# Patient Record
Sex: Female | Born: 1985 | Race: Black or African American | Hispanic: No | Marital: Single | Smoking: Never smoker
Health system: Southern US, Community
[De-identification: ages and names within clinical notes are randomized; demographics above are authoritative.]

## PROBLEM LIST (undated history)

## (undated) ENCOUNTER — Inpatient Hospital Stay (HOSPITAL_COMMUNITY): Payer: Self-pay

## (undated) DIAGNOSIS — D649 Anemia, unspecified: Secondary | ICD-10-CM

## (undated) DIAGNOSIS — F32A Depression, unspecified: Secondary | ICD-10-CM

## (undated) DIAGNOSIS — F419 Anxiety disorder, unspecified: Secondary | ICD-10-CM

## (undated) DIAGNOSIS — N281 Cyst of kidney, acquired: Secondary | ICD-10-CM

## (undated) DIAGNOSIS — J45909 Unspecified asthma, uncomplicated: Secondary | ICD-10-CM

## (undated) DIAGNOSIS — L509 Urticaria, unspecified: Secondary | ICD-10-CM

## (undated) DIAGNOSIS — J069 Acute upper respiratory infection, unspecified: Secondary | ICD-10-CM

## (undated) DIAGNOSIS — T783XXA Angioneurotic edema, initial encounter: Secondary | ICD-10-CM

## (undated) DIAGNOSIS — F329 Major depressive disorder, single episode, unspecified: Secondary | ICD-10-CM

## (undated) HISTORY — DX: Acute upper respiratory infection, unspecified: J06.9

## (undated) HISTORY — DX: Angioneurotic edema, initial encounter: T78.3XXA

## (undated) HISTORY — PX: NO PAST SURGERIES: SHX2092

## (undated) HISTORY — DX: Urticaria, unspecified: L50.9

---
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
## 2012-05-23 ENCOUNTER — Encounter (HOSPITAL_COMMUNITY): Payer: Self-pay | Admitting: *Deleted

## 2012-05-23 ENCOUNTER — Emergency Department (HOSPITAL_COMMUNITY)
Admission: EM | Admit: 2012-05-23 | Discharge: 2012-05-23 | Disposition: A | Discharge status: Home / Self Care | Payer: Medicaid Other | Attending: Emergency Medicine | Admitting: Emergency Medicine

## 2012-05-23 DIAGNOSIS — R42 Dizziness and giddiness: Secondary | ICD-10-CM

## 2012-05-23 DIAGNOSIS — Z3202 Encounter for pregnancy test, result negative: Secondary | ICD-10-CM | POA: Insufficient documentation

## 2012-05-23 DIAGNOSIS — R110 Nausea: Secondary | ICD-10-CM

## 2012-05-23 DIAGNOSIS — Z87448 Personal history of other diseases of urinary system: Secondary | ICD-10-CM | POA: Insufficient documentation

## 2012-05-23 DIAGNOSIS — R112 Nausea with vomiting, unspecified: Secondary | ICD-10-CM | POA: Insufficient documentation

## 2012-05-23 HISTORY — DX: Cyst of kidney, acquired: N28.1

## 2012-05-23 LAB — CBC WITH DIFFERENTIAL/PLATELET
Eosinophils Relative: 3 % (ref 0–5)
HCT: 39.5 % (ref 36.0–46.0)
Hemoglobin: 13.3 g/dL (ref 12.0–15.0)
Lymphocytes Relative: 27 % (ref 12–46)
Lymphs Abs: 2.5 10*3/uL (ref 0.7–4.0)
MCV: 98.8 fL (ref 78.0–100.0)
Monocytes Absolute: 0.8 10*3/uL (ref 0.1–1.0)
Monocytes Relative: 9 % (ref 3–12)
Neutro Abs: 5.6 10*3/uL (ref 1.7–7.7)
WBC: 9.1 10*3/uL (ref 4.0–10.5)

## 2012-05-23 LAB — BASIC METABOLIC PANEL
BUN: 13 mg/dL (ref 6–23)
CO2: 27 mEq/L (ref 19–32)
Calcium: 9.3 mg/dL (ref 8.4–10.5)
Chloride: 100 mEq/L (ref 96–112)
Creatinine, Ser: 0.66 mg/dL (ref 0.50–1.10)
Glucose, Bld: 80 mg/dL (ref 70–99)

## 2012-05-23 LAB — PREGNANCY, URINE: Preg Test, Ur: NEGATIVE

## 2012-05-23 NOTE — ED Provider Notes (Signed)
History     CSN: [id number]  Arrival date & time 05/23/12  1255   First MD Initiated Contact with Patient 05/23/12 1547      Chief Complaint  Patient presents with  . Dizziness  . Emesis    (Consider location/radiation/quality/duration/timing/severity/associated sxs/prior treatment) Patient is a 27 y.o. female presenting with vomiting. The history is provided by the patient.  Emesis Associated symptoms: no abdominal pain, no chills and no headaches   pt c/o feeling lightheaded/dizzy at times in past couple months. States will occur randomly, at rest. Not necessarily related to standing. No acute or abrupt change today. No episodes of fainting. No associated palpitations or sense of irregular heart beat. No chest pain or discomfort. No unusual doe or fatigue. No diaphoresis. Denies any room spinning sensation. No ear pain, hearing loss or  Tinnitus. No headaches. No numbness/weakness. No change in speech or vision. Is eating and drinking normally. No abrupt wt loss or wt gain. Denies heat or cold intolerance, skin changes, sweats/chills, swelling, or hx thyroid disease. No recent cold or uri symptoms. Had single episode emesis today. No abd pain. No diarrhea. No dysuria or gu c/o. iud for birth control. Missed period this month. Denies vaginal bleeding or discharge. Denies hx anemia, heavy periods, or any hx melena/rectal bleeding. No recent med use or change.     Past Medical History  Diagnosis Date  . Cyst of right kidney     History reviewed. No pertinent past surgical history.  No family history on file.  History  Substance Use Topics  . Smoking status: Never Smoker   . Smokeless tobacco: Not on file  . Alcohol Use: Yes     Comment: social    OB History   Grav Para Term Preterm Abortions TAB SAB Ect Mult Living                  Review of Systems  Constitutional: Negative for fever and chills.  HENT: Negative for neck pain.   Eyes: Negative for visual disturbance.   Respiratory: Negative for cough and shortness of breath.   Cardiovascular: Negative for chest pain, palpitations and leg swelling.  Gastrointestinal: Positive for vomiting. Negative for abdominal pain and blood in stool.  Genitourinary: Negative for flank pain and vaginal bleeding.  Musculoskeletal: Negative for back pain.  Skin: Negative for rash.  Neurological: Negative for syncope and headaches.  Hematological: Does not bruise/bleed easily.  Psychiatric/Behavioral: Negative for confusion.    Allergies  Review of patient's allergies indicates no known allergies.  Home Medications  No current outpatient prescriptions on file.  BP 131/69  Pulse 87  Temp(Src) 97.9 F (36.6 C) (Oral)  Resp 18  Ht 5\' 5"  (1.651 m)  Wt 135 lb (61.236 kg)  BMI 22.47 kg/m2  SpO2 100%  LMP 03/28/2012  Physical Exam  Nursing note and vitals reviewed. Constitutional: She is oriented to person, place, and time. She appears well-developed and well-nourished. No distress.  HENT:  Nose: Nose normal.  Mouth/Throat: Oropharynx is clear and moist.  Eyes: Conjunctivae are normal. Pupils are equal, round, and reactive to light. No scleral icterus.  Neck: Neck supple. No tracheal deviation present. No thyromegaly present.  Cardiovascular: Normal rate, regular rhythm, normal heart sounds and intact distal pulses.  Exam reveals no gallop and no friction rub.   No murmur heard. Pulmonary/Chest: Effort normal and breath sounds normal. No respiratory distress.  Abdominal: Soft. Normal appearance. She exhibits no distension. There is no tenderness.  Musculoskeletal:  She exhibits no edema.  Neurological: She is alert and oriented to person, place, and time.  Steady gait  Skin: Skin is warm and dry. No rash noted. She is not diaphoretic.  Psychiatric: She has a normal mood and affect.    ED Course  Procedures (including critical care time)  Results for orders placed during the hospital encounter of 05/23/12   PREGNANCY, URINE      Result Value Range   Preg Test, Ur NEGATIVE  NEGATIVE  CBC WITH DIFFERENTIAL      Result Value Range   WBC 9.1  4.0 - 10.5 K/uL   RBC 4.00  3.87 - 5.11 MIL/uL   Hemoglobin 13.3  12.0 - 15.0 g/dL   HCT 40.9  81.1 - 91.4 %   MCV 98.8  78.0 - 100.0 fL   MCH 33.3  26.0 - 34.0 pg   MCHC 33.7  30.0 - 36.0 g/dL   RDW 78.2  95.6 - 21.3 %   Platelets 217  150 - 400 K/uL   Neutrophils Relative 61  43 - 77 %   Neutro Abs 5.6  1.7 - 7.7 K/uL   Lymphocytes Relative 27  12 - 46 %   Lymphs Abs 2.5  0.7 - 4.0 K/uL   Monocytes Relative 9  3 - 12 %   Monocytes Absolute 0.8  0.1 - 1.0 K/uL   Eosinophils Relative 3  0 - 5 %   Eosinophils Absolute 0.2  0.0 - 0.7 K/uL   Basophils Relative 0  0 - 1 %   Basophils Absolute 0.0  0.0 - 0.1 K/uL  BASIC METABOLIC PANEL      Result Value Range   Sodium 136  135 - 145 mEq/L   Potassium 3.5  3.5 - 5.1 mEq/L   Chloride 100  96 - 112 mEq/L   CO2 27  19 - 32 mEq/L   Glucose, Bld 80  70 - 99 mg/dL   BUN 13  6 - 23 mg/dL   Creatinine, Ser 0.86  0.50 - 1.10 mg/dL   Calcium 9.3  8.4 - 57.8 mg/dL   GFR calc non Af Amer >90  >90 mL/min   GFR calc Af Amer >90  >90 mL/min       MDM  Labs.  Reviewed nursing notes and prior charts for additional history.   Pt ambulatory about ed with steady gait, no faintness/dizziness.  No acute or abrupt change in symptoms today.  Labs normal.   Pt appears stable for d/c.        Suzi Roots, MD 05/23/12 1700

## 2012-05-23 NOTE — ED Notes (Addendum)
Pt c/o dizzy spells for the past month, vomiting this am. Unsure of pregnancy, states that she did miss her last period but has IUD in place

## 2012-12-22 ENCOUNTER — Encounter (HOSPITAL_COMMUNITY): Payer: Self-pay | Admitting: Emergency Medicine

## 2012-12-22 ENCOUNTER — Emergency Department (HOSPITAL_COMMUNITY): Payer: Medicaid Other

## 2012-12-22 ENCOUNTER — Emergency Department (HOSPITAL_COMMUNITY)
Admission: EM | Admit: 2012-12-22 | Discharge: 2012-12-22 | Disposition: A | Discharge status: Home / Self Care | Payer: Medicaid Other | Attending: Emergency Medicine | Admitting: Emergency Medicine

## 2012-12-22 DIAGNOSIS — Z87448 Personal history of other diseases of urinary system: Secondary | ICD-10-CM | POA: Insufficient documentation

## 2012-12-22 DIAGNOSIS — R0789 Other chest pain: Secondary | ICD-10-CM

## 2012-12-22 DIAGNOSIS — Z3202 Encounter for pregnancy test, result negative: Secondary | ICD-10-CM | POA: Insufficient documentation

## 2012-12-22 LAB — POCT PREGNANCY, URINE: Preg Test, Ur: NEGATIVE

## 2012-12-22 LAB — BASIC METABOLIC PANEL
BUN: 9 mg/dL (ref 6–23)
Calcium: 9.7 mg/dL (ref 8.4–10.5)
Chloride: 105 mEq/L (ref 96–112)
Creatinine, Ser: 0.6 mg/dL (ref 0.50–1.10)
GFR calc Af Amer: >90 mL/min (ref >90)

## 2012-12-22 LAB — CBC
HCT: 36.9 % (ref 36.0–46.0)
MCH: 32.3 pg (ref 26.0–34.0)
MCHC: 34.1 g/dL (ref 30.0–36.0)
MCV: 94.6 fL (ref 78.0–100.0)
RDW: 12.4 % (ref 11.5–15.5)
WBC: 8.3 10*3/uL (ref 4.0–10.5)

## 2012-12-22 NOTE — ED Notes (Signed)
Pt reports feeling a little lightheaded, and SOB. Denies CP at this time.

## 2012-12-22 NOTE — ED Provider Notes (Signed)
CSN: [id number]     Arrival date & time 12/22/12  1535 History   First MD Initiated Contact with Patient 12/22/12 1844     Chief Complaint  Patient presents with  . Chest Pain   (Consider location/radiation/quality/duration/timing/severity/associated sxs/prior Treatment) HPI Comments: 27 yo female with no medical hx, non smoker, no birth control presents after episode of chest tightness last night that has since resolved.  No syncope, HA or lung hx.  Patient denies blood clot history, active cancer, recent major trauma or surgery, unilateral leg swelling/ pain, recent long travel, hemoptysis or oral contraceptives.  No infection recently.   Patient is a 27 y.o. female presenting with chest pain. The history is provided by the patient.  Chest Pain Associated symptoms: no abdominal pain, no back pain, no fever, no headache, no shortness of breath and not vomiting     Past Medical History  Diagnosis Date  . Cyst of right kidney    History reviewed. No pertinent past surgical history. History reviewed. No pertinent family history. History  Substance Use Topics  . Smoking status: Never Smoker   . Smokeless tobacco: Not on file  . Alcohol Use: Yes     Comment: social   OB History   Grav Para Term Preterm Abortions TAB SAB Ect Mult Living                 Review of Systems  Constitutional: Negative for fever and chills.  Eyes: Negative for visual disturbance.  Respiratory: Positive for chest tightness. Negative for shortness of breath.   Cardiovascular: Negative for chest pain and leg swelling.  Gastrointestinal: Negative for vomiting and abdominal pain.  Genitourinary: Negative for dysuria and flank pain.  Musculoskeletal: Negative for back pain, neck pain and neck stiffness.  Skin: Negative for rash.  Neurological: Negative for light-headedness and headaches.    Allergies  Review of patient's allergies indicates no known allergies.  Home Medications  No current outpatient  prescriptions on file. BP 105/66  Pulse 77  Temp(Src) 98.3 F (36.8 C) (Oral)  Resp 17  SpO2 99%  LMP 11/25/2012 Physical Exam  Nursing note and vitals reviewed. Constitutional: She is oriented to person, place, and time. She appears well-developed and well-nourished.  HENT:  Head: Normocephalic and atraumatic.  Mouth/Throat: Oropharynx is clear and moist.  Eyes: Conjunctivae are normal. Right eye exhibits no discharge. Left eye exhibits no discharge.  Neck: Normal range of motion. Neck supple. No tracheal deviation present.  Cardiovascular: Normal rate, regular rhythm and intact distal pulses.   No murmur heard. Pulmonary/Chest: Effort normal and breath sounds normal.  Abdominal: Soft. She exhibits no distension. There is no tenderness. There is no guarding.  Musculoskeletal: She exhibits no edema and no tenderness.  Neurological: She is alert and oriented to person, place, and time.  Skin: Skin is warm. No rash noted.  Psychiatric: She has a normal mood and affect.    ED Course  Procedures (including critical care time) Labs Review Labs Reviewed  BASIC METABOLIC PANEL - Abnormal; Notable for the following:    Glucose, Bld 140 (*)    All other components within normal limits  CBC  PRO B NATRIURETIC PEPTIDE  POCT I-STAT TROPONIN I  POCT PREGNANCY, URINE   Imaging Review Dg Chest 2 View  12/22/2012   CLINICAL DATA:  Short of breath, chest tightness  EXAM: CHEST  2 VIEW  COMPARISON:  None.  FINDINGS: No active infiltrate or effusion is seen. Mediastinal contours appear normal. The  heart is within normal limits in size. No bony abnormality is noted.  IMPRESSION: No active lung disease.   Electronically Signed   By: Dwyane Dee M.D.   On: 12/22/2012 16:12    EKG Interpretation   None       MDM   1. Chest tightness    Pt very low risk PE and very low risk CAD.  No cp on exam. EKG no acute findings.  PERC neg, no indication for d dimer.   Results and differential  diagnosis were discussed with the patient. Close follow up outpatient was discussed, patient comfortable with the plan.   Diagnosis: Chest tightness    Enid Skeens, MD 12/22/12 [phone]

## 2012-12-22 NOTE — ED Notes (Signed)
Pt reports a mid chest tightness and sob that started yesterday. ekg done at triage, airway intact, no distress noted, spo2 96%.

## 2013-06-19 ENCOUNTER — Encounter (HOSPITAL_COMMUNITY): Payer: Self-pay | Admitting: Emergency Medicine

## 2013-06-19 ENCOUNTER — Emergency Department (HOSPITAL_COMMUNITY)
Admission: EM | Admit: 2013-06-19 | Discharge: 2013-06-19 | Disposition: A | Discharge status: Home / Self Care | Payer: Medicaid Other | Attending: Emergency Medicine | Admitting: Emergency Medicine

## 2013-06-19 ENCOUNTER — Emergency Department (HOSPITAL_COMMUNITY): Payer: Medicaid Other

## 2013-06-19 DIAGNOSIS — J40 Bronchitis, not specified as acute or chronic: Secondary | ICD-10-CM

## 2013-06-19 DIAGNOSIS — Z79899 Other long term (current) drug therapy: Secondary | ICD-10-CM | POA: Insufficient documentation

## 2013-06-19 DIAGNOSIS — J209 Acute bronchitis, unspecified: Secondary | ICD-10-CM | POA: Insufficient documentation

## 2013-06-19 DIAGNOSIS — R062 Wheezing: Secondary | ICD-10-CM | POA: Insufficient documentation

## 2013-06-19 MED ORDER — PREDNISONE 20 MG PO TABS: 40.0000 mg | ORAL_TABLET | Freq: Once | ORAL | Status: DC

## 2013-06-19 MED ORDER — ALBUTEROL SULFATE (2.5 MG/3ML) 0.083% IN NEBU
5.0000 mg | INHALATION_SOLUTION | Freq: Once | RESPIRATORY_TRACT | Status: AC
  Administered 2013-06-19: 5 mg via RESPIRATORY_TRACT
  Filled 2013-06-19 (×2): qty 6

## 2013-06-19 MED ORDER — PREDNISONE 20 MG PO TABS
40.0000 mg | ORAL_TABLET | Freq: Once | ORAL | Status: AC
  Administered 2013-06-19: 40 mg via ORAL
  Filled 2013-06-19: qty 2

## 2013-06-19 MED ORDER — ALBUTEROL SULFATE HFA 108 (90 BASE) MCG/ACT IN AERS
2.0000 | INHALATION_SPRAY | RESPIRATORY_TRACT | Status: DC | PRN
  Administered 2013-06-19: 2 via RESPIRATORY_TRACT
  Filled 2013-06-19: qty 6.7

## 2013-06-19 NOTE — Discharge Instructions (Signed)

## 2013-06-19 NOTE — ED Provider Notes (Signed)
CSN: [id number]     Arrival date & time 06/19/13  1931 History   First MD Initiated Contact with Patient 06/19/13 2016     Chief Complaint  Patient presents with  . Shortness of Breath     (Consider location/radiation/quality/duration/timing/severity/associated sxs/prior Treatment) Patient is a 28 y.o. female presenting with shortness of breath.  Shortness of Breath  Pt with no significant PMH reports 2 days of cough, wheezing and SOB since vacuuming. This has happened before with dust but typically improves on its own. She called EMS yesterday for same and was given neb with some improvement but did not seek additional treatment. She denies fever. No CP, nausea vomiting. PERC neg although she does reports some vague R calf soreness there has been no swelling.   Past Medical History  Diagnosis Date  . Cyst of right kidney    History reviewed. No pertinent past surgical history. No family history on file. History  Substance Use Topics  . Smoking status: Never Smoker   . Smokeless tobacco: Not on file  . Alcohol Use: Yes     Comment: social   OB History   Grav Para Term Preterm Abortions TAB SAB Ect Mult Living                 Review of Systems  Respiratory: Positive for shortness of breath.     All other systems reviewed and are negative except as noted in HPI.    Allergies  Review of patient's allergies indicates no known allergies.  Home Medications   Prior to Admission medications   Not on File   BP 127/90  Pulse 98  Temp(Src) 98.2 F (36.8 C) (Oral)  Resp 18  Ht 5\' 4"  (1.626 m)  Wt 175 lb (79.379 kg)  BMI 30.02 kg/m2  SpO2 99%  LMP 05/19/2013 Physical Exam  Nursing note and vitals reviewed. Constitutional: She is oriented to person, place, and time. She appears well-developed and well-nourished.  HENT:  Head: Normocephalic and atraumatic.  Eyes: EOM are normal. Pupils are equal, round, and reactive to light.  Neck: Normal range of motion. Neck  supple.  Cardiovascular: Normal rate, normal heart sounds and intact distal pulses.   Pulmonary/Chest: Effort normal. No respiratory distress. She has wheezes. She has no rales.  Abdominal: Bowel sounds are normal. She exhibits no distension. There is no tenderness.  Musculoskeletal: Normal range of motion. She exhibits no edema and no tenderness.  Neurological: She is alert and oriented to person, place, and time. She has normal strength. No cranial nerve deficit or sensory deficit.  Skin: Skin is warm and dry. No rash noted.  Psychiatric: She has a normal mood and affect.    ED Course  Procedures (including critical care time) Labs Review Labs Reviewed - No data to display  Imaging Review Dg Chest 2 View  06/19/2013   CLINICAL DATA:  Shortness of breath for 3 days, history acute bronchitis  EXAM: CHEST  2 VIEW  COMPARISON:  12/22/2012  FINDINGS: Normal heart size, mediastinal contours, and pulmonary vascularity.  Peribronchial thickening centrally, chronic.  No acute infiltrate, pleural effusion or pneumothorax.  Bones unremarkable.  IMPRESSION: Mild chronic bronchitic changes without acute infiltrate.   Electronically Signed   By: Ulyses SouthwardMark  Boles M.D.   On: 06/19/2013 21:08     EKG Interpretation   Date/Time:  Saturday June 19 2013 19:35:00 EDT Ventricular Rate:  98 PR Interval:  144 QRS Duration: 82 QT Interval:  346 QTC Calculation: 441 R  Axis:   77 Text Interpretation:  Normal sinus rhythm Normal ECG No significant change  since last tracing Confirmed by [facility]  MD, Leonette MostHARLES [phone] on 06/19/2013  8:14:47 PM      MDM   Final diagnoses:  Bronchitis   Bronchitis, viral vs allergic. CXR ordered in triage is pending. EKG is normal. Plan neb, prednisone, MDI and PCP followup.     Charles B. Bernette MayersSheldon, MD 06/19/13 2159

## 2013-06-19 NOTE — ED Notes (Signed)
Pt reports chest tightness

## 2013-06-19 NOTE — ED Notes (Signed)
Pt c/o sob since yesterday no chest pain .  C/o rt calf pain for 3-4 days.  She denies  A cold or cough.  Although she has a productive sounding cough

## 2016-01-10 ENCOUNTER — Encounter: Payer: Self-pay | Admitting: Obstetrics & Gynecology

## 2016-01-10 ENCOUNTER — Ambulatory Visit (INDEPENDENT_AMBULATORY_CARE_PROVIDER_SITE_OTHER): Payer: Federal, State, Local not specified - PPO

## 2016-01-10 DIAGNOSIS — Z3201 Encounter for pregnancy test, result positive: Secondary | ICD-10-CM

## 2016-01-10 DIAGNOSIS — Z3200 Encounter for pregnancy test, result unknown: Secondary | ICD-10-CM

## 2016-01-10 LAB — POCT PREGNANCY, URINE: Preg Test, Ur: POSITIVE — AB

## 2016-01-10 NOTE — Progress Notes (Signed)
Patient presented to office today for pregnancy test. Test confirms she is pregnant at this time. Patient is currently taking prenatal vitamins. She has been given a letter of verification to apply for pregnancy medicaid.

## 2016-01-14 ENCOUNTER — Encounter (HOSPITAL_COMMUNITY): Payer: Self-pay | Admitting: *Deleted

## 2016-01-14 ENCOUNTER — Inpatient Hospital Stay (HOSPITAL_COMMUNITY): Payer: Federal, State, Local not specified - PPO

## 2016-01-14 ENCOUNTER — Inpatient Hospital Stay (HOSPITAL_COMMUNITY)
Admission: AD | Admit: 2016-01-14 | Discharge: 2016-01-14 | Disposition: A | Discharge status: Home / Self Care | Payer: Federal, State, Local not specified - PPO | Source: Ambulatory Visit | Attending: Obstetrics & Gynecology | Admitting: Obstetrics & Gynecology

## 2016-01-14 DIAGNOSIS — B373 Candidiasis of vulva and vagina: Secondary | ICD-10-CM | POA: Diagnosis not present

## 2016-01-14 DIAGNOSIS — O210 Mild hyperemesis gravidarum: Secondary | ICD-10-CM | POA: Insufficient documentation

## 2016-01-14 DIAGNOSIS — O26891 Other specified pregnancy related conditions, first trimester: Secondary | ICD-10-CM

## 2016-01-14 DIAGNOSIS — B3731 Acute candidiasis of vulva and vagina: Secondary | ICD-10-CM

## 2016-01-14 DIAGNOSIS — R1031 Right lower quadrant pain: Secondary | ICD-10-CM | POA: Diagnosis not present

## 2016-01-14 DIAGNOSIS — B9689 Other specified bacterial agents as the cause of diseases classified elsewhere: Secondary | ICD-10-CM

## 2016-01-14 DIAGNOSIS — O26899 Other specified pregnancy related conditions, unspecified trimester: Secondary | ICD-10-CM

## 2016-01-14 DIAGNOSIS — O23591 Infection of other part of genital tract in pregnancy, first trimester: Secondary | ICD-10-CM | POA: Diagnosis not present

## 2016-01-14 DIAGNOSIS — Z3A01 Less than 8 weeks gestation of pregnancy: Secondary | ICD-10-CM | POA: Insufficient documentation

## 2016-01-14 DIAGNOSIS — R109 Unspecified abdominal pain: Secondary | ICD-10-CM

## 2016-01-14 DIAGNOSIS — O98811 Other maternal infectious and parasitic diseases complicating pregnancy, first trimester: Secondary | ICD-10-CM | POA: Insufficient documentation

## 2016-01-14 DIAGNOSIS — N760 Acute vaginitis: Secondary | ICD-10-CM

## 2016-01-14 DIAGNOSIS — R102 Pelvic and perineal pain: Secondary | ICD-10-CM | POA: Diagnosis not present

## 2016-01-14 LAB — CBC
HCT: 33.2 % — ABNORMAL LOW (ref 36.0–46.0)
HEMOGLOBIN: 11.6 g/dL — AB (ref 12.0–15.0)
MCH: 32.6 pg (ref 26.0–34.0)
MCHC: 34.9 g/dL (ref 30.0–36.0)
MCV: 93.3 fL (ref 78.0–100.0)
Platelets: 238 10*3/uL (ref 150–400)
RBC: 3.56 MIL/uL — ABNORMAL LOW (ref 3.87–5.11)
RDW: 13 % (ref 11.5–15.5)
WBC: 7.9 10*3/uL (ref 4.0–10.5)

## 2016-01-14 LAB — URINALYSIS, ROUTINE W REFLEX MICROSCOPIC
BILIRUBIN URINE: NEGATIVE
GLUCOSE, UA: NEGATIVE mg/dL
Hgb urine dipstick: NEGATIVE
KETONES UR: NEGATIVE mg/dL
LEUKOCYTES UA: NEGATIVE
NITRITE: NEGATIVE
PH: 7 (ref 5.0–8.0)
Protein, ur: NEGATIVE mg/dL
SPECIFIC GRAVITY, URINE: 1.015 (ref 1.005–1.030)

## 2016-01-14 LAB — WET PREP, GENITAL
Sperm: NONE SEEN
Trich, Wet Prep: NONE SEEN

## 2016-01-14 LAB — HCG, QUANTITATIVE, PREGNANCY: HCG, BETA CHAIN, QUANT, S: 5930 m[IU]/mL — AB (ref <5)

## 2016-01-14 LAB — ABO/RH: ABO/RH(D): B POS

## 2016-01-14 MED ORDER — TERCONAZOLE 0.4 % VA CREA: 1.0000 | TOPICAL_CREAM | Freq: Every day | VAGINAL | 0 refills | Status: DC

## 2016-01-14 MED ORDER — PROMETHAZINE HCL 25 MG PO TABS: 25.0000 mg | ORAL_TABLET | Freq: Four times a day (QID) | ORAL | 0 refills | Status: DC | PRN

## 2016-01-14 MED ORDER — METRONIDAZOLE 500 MG PO TABS: 500.0000 mg | ORAL_TABLET | Freq: Two times a day (BID) | ORAL | 0 refills | Status: DC

## 2016-01-14 NOTE — MAU Note (Signed)
Pt presents to MAU with complaints of pain in the upper right portion of her back, states that she was having pain in the right lower portion of her abdomen but it has been  radiating back and forth for two days. Denies any vaginal bleeding or abnormal discharge

## 2016-01-14 NOTE — MAU Provider Note (Signed)
History     CSN: [id number]  Arrival date and time: 01/14/16 1643   None     No chief complaint on file.  HPI Pt is 30 [id number]. [redacted]w[redacted]d who presents with upper abdominal and back pain- but initially started in right lower quadrant of her abdomen.  Pt states she has been nauseated since she found out she was pregnant.  She had a confirmation of pregnancy on 01/09/2018 and was not having pain at that time.  Pt denies spotting or bleeding. Or abnormal discharge.   RN note: Krista Shelle Iron, RN  Nursing    [] Hide copied text [] Hover for attribution information Pt presents to MAU with complaints of pain in the upper right portion of her back, states that she was having pain in the right lower portion of her abdomen but it has been  radiating back and forth for two days. Denies any vaginal bleeding or abnormal discharge    Electronically      Past Medical History:  Diagnosis Date  . Cyst of right kidney     No past surgical history on file.  No family history on file.  Social History  Substance Use Topics  . Smoking status: Never Smoker  . Smokeless tobacco: Not on file  . Alcohol use Yes     Comment: social    Allergies: No Known Allergies  Prescriptions Prior to Admission  Medication Sig Dispense Refill Last Dose  . guaiFENesin (MUCINEX) 600 MG 12 hr tablet Take 600 mg by mouth 2 (two) times daily.   06/18/2013 at Unknown time  . Multiple Vitamin (MULTIVITAMIN) tablet Take 1 tablet by mouth daily.   06/18/2013 at Unknown time  . predniSONE (DELTASONE) 20 MG tablet Take 2 tablets (40 mg total) by mouth once. 8 tablet 0     Review of Systems  Constitutional: Negative for chills and fever.  Gastrointestinal: Positive for abdominal pain, constipation and nausea. Negative for diarrhea and vomiting.  Genitourinary: Negative for dysuria.  Neurological: Positive for dizziness.   Physical Exam   Blood pressure 124/89, pulse 83, temperature 98.4 F (36.9 C), resp. rate  18, height 5\' 3"  (1.6 m), weight 185 lb (83.9 kg), last menstrual period 12/04/2015.  Physical Exam  Nursing note and vitals reviewed. Constitutional: She is oriented to person, place, and time. She appears well-developed and well-nourished. No distress.  HENT:  Head: Normocephalic.  Eyes: Pupils are equal, round, and reactive to light.  Neck: Normal range of motion. Neck supple.  Cardiovascular: Normal rate.   Respiratory: Effort normal.  GI: Soft. She exhibits no distension. There is tenderness. There is no rebound.  Genitourinary: Vagina normal.  Genitourinary Comments: Reddened vaginal mucosa, small amount of white thick discharge in vault;  cervix clean, NT; uterus NSSC NT; adnexa without palpable enlargement or tenderness  Musculoskeletal: Normal range of motion.  Neurological: She is alert and oriented to person, place, and time.  Skin: Skin is warm and dry.  Psychiatric: She has a normal mood and affect.    MAU Course  Procedures Results for orders placed or performed during the hospital encounter of 01/14/16 (from the past 24 hour(s))  Wet prep, genital     Status: Abnormal   Collection Time: 01/14/16  5:05 PM  Result Value Ref Range   Yeast Wet Prep HPF POC PRESENT (A) NONE SEEN   Trich, Wet Prep NONE SEEN NONE SEEN   Clue Cells Wet Prep HPF POC PRESENT (A) NONE SEEN   WBC, Wet Prep  HPF POC RARE (A) NONE SEEN   Sperm NONE SEEN   Urinalysis, Routine w reflex microscopic ([facility])     Status: None   Collection Time: 01/14/16  5:12 PM  Result Value Ref Range   Color, Urine YELLOW YELLOW   APPearance CLEAR CLEAR   Specific Gravity, Urine 1.015 1.005 - 1.030   pH 7.0 5.0 - 8.0   Glucose, UA NEGATIVE NEGATIVE mg/dL   Hgb urine dipstick NEGATIVE NEGATIVE   Bilirubin Urine NEGATIVE NEGATIVE   Ketones, ur NEGATIVE NEGATIVE mg/dL   Protein, ur NEGATIVE NEGATIVE mg/dL   Nitrite NEGATIVE NEGATIVE   Leukocytes, UA NEGATIVE NEGATIVE  CBC     Status: Abnormal   Collection  Time: 01/14/16  5:20 PM  Result Value Ref Range   WBC 7.9 4.0 - 10.5 K/uL   RBC 3.56 (L) 3.87 - 5.11 MIL/uL   Hemoglobin 11.6 (L) 12.0 - 15.0 g/dL   HCT 40.933.2 (L) 81.136.0 - 91.446.0 %   MCV 93.3 78.0 - 100.0 fL   MCH 32.6 26.0 - 34.0 pg   MCHC 34.9 30.0 - 36.0 g/dL   RDW 78.213.0 95.611.5 - 21.315.5 %   Platelets 238 150 - 400 K/uL  ABO/Rh     Status: None (Preliminary result)   Collection Time: 01/14/16  5:20 PM  Result Value Ref Range   ABO/RH(D) B POS   [place] Ob Comp Less 14 Wks  Result Date: 01/14/2016 CLINICAL DATA:  30 year old pregnant female with pelvic pain. Estimated gestational age of [redacted] weeks 6 days by LMP. EXAM: OBSTETRIC <14 WK US AND TRANSVAGINAL OB US TECHNIQUE: Both transabdominal and transvaginal ultrasound examinations were performed for complete evaluation of the gestation as well as the maternal uterus, adnexal regions, and pelvic cul-de-sac. Transvaginal technique was performed to assess early pregnancy. COMPARISON:  None. FINDINGS: Intrauterine gestational sac: Single Yolk sac:  Visualized. Embryo:  Not Visualized. MSD: 8.2  mm   5 w   4  d             US EDC: 09/09/2016 Subchorionic hemorrhage:  None visualized. Maternal uterus/adnexae: The ovaries are unremarkable. A trace amount of free fluid is noted. There is no evidence of adnexal mass. IMPRESSION: Single intrauterine gestational sac with yolk sac - estimated gestational age of [redacted] weeks 4 days by mean sac diameter. Fetal pole not identified at this time. No evidence of subchorionic hemorrhage. Trace amount of free fluid.  No evidence of adnexal mass. Electronically Signed   By: Harmon PierJeffrey  Hu M.D.   On: 01/14/2016 18:02   [place] Ob Transvaginal  Result Date: 01/14/2016 CLINICAL DATA:  30 year old pregnant female with pelvic pain. Estimated gestational age of [redacted] weeks 6 days by LMP. EXAM: OBSTETRIC <14 WK US AND TRANSVAGINAL OB US TECHNIQUE: Both transabdominal and transvaginal ultrasound examinations were performed for complete evaluation of the  gestation as well as the maternal uterus, adnexal regions, and pelvic cul-de-sac. Transvaginal technique was performed to assess early pregnancy. COMPARISON:  None. FINDINGS: Intrauterine gestational sac: Single Yolk sac:  Visualized. Embryo:  Not Visualized. MSD: 8.2  mm   5 w   4  d             US EDC: 09/09/2016 Subchorionic hemorrhage:  None visualized. Maternal uterus/adnexae: The ovaries are unremarkable. A trace amount of free fluid is noted. There is no evidence of adnexal mass. IMPRESSION: Single intrauterine gestational sac with yolk sac - estimated gestational age of [redacted] weeks 4 days by mean  sac diameter. Fetal pole not identified at this time. No evidence of subchorionic hemorrhage. Trace amount of free fluid.  No evidence of adnexal mass. Electronically Signed   By: Harmon PierJeffrey  Hu M.D.   On: 01/14/2016 18:02    Assessment and Plan  Abdominal pain in pregnancy IUP [id number] EDC 09/09/2016 Yeast vaginitis- RX Terazol vaginal cream Bacterial vaginosis- Flagyl 500mg  BID for 7 days Morning sickness- Rx phenergan F/u with OB care  Rawlin Reaume 01/14/2016, 5:02 PM

## 2016-01-14 NOTE — Discharge Instructions (Signed)
Abdominal Pain During Pregnancy Belly (abdominal) pain is common during pregnancy. Most of the time, it is not a serious problem. Other times, it can be a sign that something is wrong with the pregnancy. Always tell your doctor if you have belly pain. Follow these instructions at home: Monitor your belly pain for any changes. The following actions may help you feel better:  Do not have sex (intercourse) or put anything in your vagina until you feel better.  Rest until your pain stops.  Drink clear fluids if you feel sick to your stomach (nauseous). Do not eat solid food until you feel better.  Only take medicine as told by your doctor.  Keep all doctor visits as told. Get help right away if:  You are bleeding, leaking fluid, or pieces of tissue come out of your vagina.  You have more pain or cramping.  You keep throwing up (vomiting).  You have pain when you pee (urinate) or have blood in your pee.  You have a fever.  You do not feel your baby moving as much.  You feel very weak or feel like passing out.  You have trouble breathing, with or without belly pain.  You have a very bad headache and belly pain.  You have fluid leaking from your vagina and belly pain.  You keep having watery poop (diarrhea).  Your belly pain does not go away after resting, or the pain gets worse. This information is not intended to replace advice given to you by your health care provider. Make sure you discuss any questions you have with your health care provider. Document Released: 01/30/2009 Document Revised: 09/20/2015 Document Reviewed: 09/10/2012 Elsevier Interactive Patient Education  2017 [employer] Safe Medications in Pregnancy   Acne: Benzoyl Peroxide Salicylic Acid  Backache/Headache: Tylenol: 2 regular strength every 4 hours OR              2 Extra strength every 6 hours  Colds/Coughs/Allergies: Benadryl (alcohol free) 25 mg every 6 hours as needed Breath right  strips Claritin Cepacol throat lozenges Chloraseptic throat spray Cold-Eeze- up to three times per day Cough drops, alcohol free Flonase (by prescription only) Guaifenesin Mucinex Robitussin DM (plain only, alcohol free) Saline nasal spray/drops Sudafed (pseudoephedrine) & Actifed ** use only after [redacted] weeks gestation and if you do not have high blood pressure Tylenol Vicks Vaporub Zinc lozenges Zyrtec   Constipation: Colace Ducolax suppositories Fleet enema Glycerin suppositories Metamucil Milk of magnesia Miralax Senokot Smooth move tea  Diarrhea: Kaopectate Imodium A-D  *NO pepto Bismol  Hemorrhoids: Anusol Anusol HC Preparation H Tucks  Indigestion: Tums Maalox Mylanta Zantac  Pepcid  Insomnia: Benadryl (alcohol free) 25mg  every 6 hours as needed Tylenol PM Unisom, no Gelcaps  Leg Cramps: Tums MagGel  Nausea/Vomiting:  Bonine Dramamine Emetrol Ginger extract Sea bands Meclizine  Nausea medication to take during pregnancy:  Unisom (doxylamine succinate 25 mg tablets) Take one tablet daily at bedtime. If symptoms are not adequately controlled, the dose can be increased to a maximum recommended dose of two tablets daily (1/2 tablet in the morning, 1/2 tablet mid-afternoon and one at bedtime). Vitamin B6 100mg  tablets. Take one tablet twice a day (up to 200 mg per day).  Skin Rashes: Aveeno products Benadryl cream or 25mg  every 6 hours as needed Calamine Lotion 1% cortisone cream  Yeast infection: Gyne-lotrimin 7 Monistat 7   **If taking multiple medications, please check labels to avoid duplicating the same active ingredients **take medication as directed on  the label ** Do not exceed 4000 mg of tylenol in 24 hours **Do not take medications that contain aspirin or ibuprofen

## 2016-01-15 LAB — GC/CHLAMYDIA PROBE AMP (~~LOC~~) NOT AT ARMC
Chlamydia: NEGATIVE
Neisseria Gonorrhea: NEGATIVE

## 2016-01-15 LAB — HIV ANTIBODY (ROUTINE TESTING W REFLEX): HIV SCREEN 4TH GENERATION: NONREACTIVE

## 2016-02-14 ENCOUNTER — Ambulatory Visit (HOSPITAL_COMMUNITY)
Admission: RE | Admit: 2016-02-14 | Discharge: 2016-02-14 | Disposition: A | Discharge status: Home / Self Care | Payer: Federal, State, Local not specified - PPO | Source: Ambulatory Visit | Attending: Obstetrics and Gynecology | Admitting: Obstetrics and Gynecology

## 2016-02-14 ENCOUNTER — Ambulatory Visit: Payer: Self-pay

## 2016-02-14 ENCOUNTER — Encounter: Payer: Self-pay | Admitting: Obstetrics and Gynecology

## 2016-02-14 ENCOUNTER — Ambulatory Visit (INDEPENDENT_AMBULATORY_CARE_PROVIDER_SITE_OTHER): Payer: Federal, State, Local not specified - PPO | Admitting: Obstetrics and Gynecology

## 2016-02-14 VITALS — BP 124/71 | HR 83 | Ht 64.0 in | Wt 187.8 lb

## 2016-02-14 DIAGNOSIS — Z23 Encounter for immunization: Secondary | ICD-10-CM | POA: Diagnosis not present

## 2016-02-14 DIAGNOSIS — O3680X0 Pregnancy with inconclusive fetal viability, not applicable or unspecified: Secondary | ICD-10-CM | POA: Diagnosis not present

## 2016-02-14 DIAGNOSIS — Z113 Encounter for screening for infections with a predominantly sexual mode of transmission: Secondary | ICD-10-CM

## 2016-02-14 DIAGNOSIS — Z3A01 Less than 8 weeks gestation of pregnancy: Secondary | ICD-10-CM | POA: Insufficient documentation

## 2016-02-14 DIAGNOSIS — Z3689 Encounter for other specified antenatal screening: Secondary | ICD-10-CM | POA: Diagnosis not present

## 2016-02-14 DIAGNOSIS — Z3491 Encounter for supervision of normal pregnancy, unspecified, first trimester: Secondary | ICD-10-CM

## 2016-02-14 DIAGNOSIS — Z124 Encounter for screening for malignant neoplasm of cervix: Secondary | ICD-10-CM

## 2016-02-14 DIAGNOSIS — Z3490 Encounter for supervision of normal pregnancy, unspecified, unspecified trimester: Secondary | ICD-10-CM | POA: Insufficient documentation

## 2016-02-14 LAB — POCT URINALYSIS DIP (DEVICE)
BILIRUBIN URINE: NEGATIVE
Glucose, UA: NEGATIVE mg/dL
Hgb urine dipstick: NEGATIVE
KETONES UR: NEGATIVE mg/dL
LEUKOCYTES UA: NEGATIVE
NITRITE: NEGATIVE
Protein, ur: NEGATIVE mg/dL
Specific Gravity, Urine: 1.025 (ref 1.005–1.030)
Urobilinogen, UA: 0.2 mg/dL (ref 0.0–1.0)
pH: 5.5 (ref 5.0–8.0)

## 2016-02-14 MED ORDER — ONDANSETRON 4 MG PO TBDP: 4.0000 mg | ORAL_TABLET | Freq: Four times a day (QID) | ORAL | 0 refills | Status: DC | PRN

## 2016-02-14 NOTE — Progress Notes (Signed)
Breastfeeding education done Prenatal info packet given Initial prenatal labs today Flu vaccine today

## 2016-02-14 NOTE — Progress Notes (Addendum)
Pt informed that the ultrasound is considered a limited OB ultrasound and is not intended to be a complete ultrasound exam.  Patient also informed that the ultrasound is not being completed with the intent of assessing for fetal or placental anomalies or any pelvic abnormalities.  Explained that the purpose of today's ultrasound is to assess for viability.  Patient acknowledges the purpose of the exam and the limitations of the study.    Single IUP Unable to obtain m-mode for FHR due to poor visualization of embryo with abdominal probe. Pt to be sent to US dept today for Ob US and definitive diagnosis of fetal viability.

## 2016-02-14 NOTE — Addendum Note (Signed)
Addended by: Jill SideAY, Gatsby Chismar L on: 02/14/2016 10:27 AM   Modules accepted: Orders

## 2016-02-14 NOTE — Patient Instructions (Signed)
First Trimester of Pregnancy  The first trimester of pregnancy is from week 1 until the end of week 12 (months 1 through 3). A week after a sperm fertilizes an egg, the egg will implant on the wall of the uterus. This embryo will begin to develop into a baby. Genes from you and your partner are forming the baby. The female genes determine whether the baby is a boy or a girl. At 6-8 weeks, the eyes and face are formed, and the heartbeat can be seen on ultrasound. At the end of 12 weeks, all the baby's organs are formed.   Now that you are pregnant, you will want to do everything you can to have a healthy baby. Two of the most important things are to get good prenatal care and to follow your health care provider's instructions. Prenatal care is all the medical care you receive before the baby's birth. This care will help prevent, find, and treat any problems during the pregnancy and childbirth.  BODY CHANGES  Your body goes through many changes during pregnancy. The changes vary from woman to woman.   · You may gain or lose a couple of pounds at first.  · You may feel sick to your stomach (nauseous) and throw up (vomit). If the vomiting is uncontrollable, call your health care provider.  · You may tire easily.  · You may develop headaches that can be relieved by medicines approved by your health care provider.  · You may urinate more often. Painful urination may mean you have a bladder infection.  · You may develop heartburn as a result of your pregnancy.  · You may develop constipation because certain hormones are causing the muscles that push waste through your intestines to slow down.  · You may develop hemorrhoids or swollen, bulging veins (varicose veins).  · Your breasts may begin to grow larger and become tender. Your nipples may stick out more, and the tissue that surrounds them (areola) may become darker.  · Your gums may bleed and may be sensitive to brushing and flossing.   · Dark spots or blotches (chloasma, mask of pregnancy) may develop on your face. This will likely fade after the baby is born.  · Your menstrual periods will stop.  · You may have a loss of appetite.  · You may develop cravings for certain kinds of food.  · You may have changes in your emotions from day to day, such as being excited to be pregnant or being concerned that something may go wrong with the pregnancy and baby.  · You may have more vivid and strange dreams.  · You may have changes in your hair. These can include thickening of your hair, rapid growth, and changes in texture. Some women also have hair loss during or after pregnancy, or hair that feels dry or thin. Your hair will most likely return to normal after your baby is born.  WHAT TO EXPECT AT YOUR PRENATAL VISITS  During a routine prenatal visit:  · You will be weighed to make sure you and the baby are growing normally.  · Your blood pressure will be taken.  · Your abdomen will be measured to track your baby's growth.  · The fetal heartbeat will be listened to starting around week 10 or 12 of your pregnancy.  · Test results from any previous visits will be discussed.  Your health care provider may ask you:  · How you are feeling.  · If you   are feeling the baby move.  · If you have had any abnormal symptoms, such as leaking fluid, bleeding, severe headaches, or abdominal cramping.  · If you are using any tobacco products, including cigarettes, chewing tobacco, and electronic cigarettes.  · If you have any questions.  Other tests that may be performed during your first trimester include:  · Blood tests to find your blood type and to check for the presence of any previous infections. They will also be used to check for low iron levels (anemia) and Rh antibodies. Later in the pregnancy, blood tests for diabetes will be done along with other tests if problems develop.  · Urine tests to check for infections, diabetes, or protein in the urine.   · An ultrasound to confirm the proper growth and development of the baby.  · An amniocentesis to check for possible genetic problems.  · Fetal screens for spina bifida and Down syndrome.  · You may need other tests to make sure you and the baby are doing well.  · HIV (human immunodeficiency virus) testing. Routine prenatal testing includes screening for HIV, unless you choose not to have this test.  HOME CARE INSTRUCTIONS   Medicines  · Follow your health care provider's instructions regarding medicine use. Specific medicines may be either safe or unsafe to take during pregnancy.  · Take your prenatal vitamins as directed.  · If you develop constipation, try taking a stool softener if your health care provider approves.  Diet  · Eat regular, well-balanced meals. Choose a variety of foods, such as meat or vegetable-based protein, fish, milk and low-fat dairy products, vegetables, fruits, and whole grain breads and cereals. Your health care provider will help you determine the amount of weight gain that is right for you.  · Avoid raw meat and uncooked cheese. These carry germs that can cause birth defects in the baby.  · Eating four or five small meals rather than three large meals a day may help relieve nausea and vomiting. If you start to feel nauseous, eating a few soda crackers can be helpful. Drinking liquids between meals instead of during meals also seems to help nausea and vomiting.  · If you develop constipation, eat more high-fiber foods, such as fresh vegetables or fruit and whole grains. Drink enough fluids to keep your urine clear or pale yellow.  Activity and Exercise  · Exercise only as directed by your health care provider. Exercising will help you:    Control your weight.    Stay in shape.    Be prepared for labor and delivery.  · Experiencing pain or cramping in the lower abdomen or low back is a good sign that you should stop exercising. Check with your health care provider  before continuing normal exercises.  · Try to avoid standing for long periods of time. Move your legs often if you must stand in one place for a long time.  · Avoid heavy lifting.  · Wear low-heeled shoes, and practice good posture.  · You may continue to have sex unless your health care provider directs you otherwise.  Relief of Pain or Discomfort  · Wear a good support bra for breast tenderness.    · Take warm sitz baths to soothe any pain or discomfort caused by hemorrhoids. Use hemorrhoid cream if your health care provider approves.    · Rest with your legs elevated if you have leg cramps or low back pain.  · If you develop varicose veins in your   legs, wear support hose. Elevate your feet for 15 minutes, 3-4 times a day. Limit salt in your diet.  Prenatal Care  · Schedule your prenatal visits by the twelfth week of pregnancy. They are usually scheduled monthly at first, then more often in the last 2 months before delivery.  · Write down your questions. Take them to your prenatal visits.  · Keep all your prenatal visits as directed by your health care provider.  Safety  · Wear your seat belt at all times when driving.  · Make a list of emergency phone numbers, including numbers for family, friends, the hospital, and police and fire departments.  General Tips  · Ask your health care provider for a referral to a local prenatal education class. Begin classes no later than at the beginning of month 6 of your pregnancy.  · Ask for help if you have counseling or nutritional needs during pregnancy. Your health care provider can offer advice or refer you to specialists for help with various needs.  · Do not use hot tubs, steam rooms, or saunas.  · Do not douche or use tampons or scented sanitary pads.  · Do not cross your legs for long periods of time.  · Avoid cat litter boxes and soil used by cats. These carry germs that can cause birth defects in the baby and possibly loss of the fetus by miscarriage or stillbirth.   · Avoid all smoking, herbs, alcohol, and medicines not prescribed by your health care provider. Chemicals in these affect the formation and growth of the baby.  · Do not use any tobacco products, including cigarettes, chewing tobacco, and electronic cigarettes. If you need help quitting, ask your health care provider. You may receive counseling support and other resources to help you quit.  · Schedule a dentist appointment. At home, brush your teeth with a soft toothbrush and be gentle when you floss.  SEEK MEDICAL CARE IF:   · You have dizziness.  · You have mild pelvic cramps, pelvic pressure, or nagging pain in the abdominal area.  · You have persistent nausea, vomiting, or diarrhea.  · You have a bad smelling vaginal discharge.  · You have pain with urination.  · You notice increased swelling in your face, hands, legs, or ankles.  SEEK IMMEDIATE MEDICAL CARE IF:   · You have a fever.  · You are leaking fluid from your vagina.  · You have spotting or bleeding from your vagina.  · You have severe abdominal cramping or pain.  · You have rapid weight gain or loss.  · You vomit blood or material that looks like coffee grounds.  · You are exposed to German measles and have never had them.  · You are exposed to fifth disease or chickenpox.  · You develop a severe headache.  · You have shortness of breath.  · You have any kind of trauma, such as from a fall or a car accident.     This information is not intended to replace advice given to you by your health care provider. Make sure you discuss any questions you have with your health care provider.     Document Released: 02/05/2001 Document Revised: 03/04/2014 Document Reviewed: 12/22/2012  Elsevier Interactive Patient Education ©2017 Elsevier Inc.

## 2016-02-14 NOTE — Progress Notes (Addendum)
Unable to demonstrate FHT's by U/S in office.  Pt was sent to radiology for U/S  U/S confirmed IUP 7 6/7 weeks but no cardiac activity Pt returned to office and U/S results were discussed with pt Tx options reviewed Pt desires D & E Pt will be contacted with OR time, desires next Tuesday or Wednesday

## 2016-02-14 NOTE — Progress Notes (Signed)
Subjective:  Krista [name] is a 30 y.o. [phone] at 7459w2d being seen today for first OB visit. She reports some problems with N/V. Phenergan helps but makes her sleepy. No chronic medical problems. TSVD x 2 in the past without problems.  She is currently monitored for the following issues for this low-risk pregnancy and has Supervision of low-risk pregnancy on her problem list.  Patient reports nausea.   . Vag. Bleeding: None.   . Denies leaking of fluid.   The following portions of the patient's history were reviewed and updated as appropriate: allergies, current medications, past family history, past medical history, past social history, past surgical history and problem list. Problem list updated.  Objective:   Vitals:   02/14/16 0840 02/14/16 0843  BP: 124/71   Pulse: 83   Weight: 187 lb 12.8 oz (85.2 kg)   Height:  5\' 4"  (1.626 m)    Fetal Status:           General:  Alert, oriented and cooperative. Patient is in no acute distress.  Skin: Skin is warm and dry. No rash noted.   Cardiovascular: Normal heart rate noted  Respiratory: Normal respiratory effort, no problems with respiration noted  Abdomen: Soft, gravid, appropriate for gestational age. Pain/Pressure: Present     Pelvic:  Cervical exam performed  closed      Extremities: Normal range of motion.  Edema: None  Mental Status: Normal mood and affect. Normal behavior. Normal judgment and thought content.  Breast sym supple, no nipple d/c, masses or adenopathy Urinalysis:      Assessment and Plan:  Pregnancy: [id number] at 7759w2d  1. Encounter for supervision of normal pregnancy in first trimester, unspecified gravidity Prenatal care reviewed with pt.  Continue with PNV. Zofran for the N/V Diet modification reviewed as well - US MFM Fetal Nuchal Translucency; Future - ondansetron (ZOFRAN ODT) 4 MG disintegrating tablet; Take 1 tablet (4 mg total) by mouth every 6 (six) hours as needed for nausea.  Dispense: 20 tablet;  Refill: 0  2. Needs flu shot Given today - Flu Vaccine QUAD 36+ mos IM (Fluarix, Quad PF)  3. Encounter for supervision of low-risk pregnancy in first trimester   Preterm labor symptoms and general obstetric precautions including but not limited to vaginal bleeding, contractions, leaking of fluid and fetal movement were reviewed in detail with the patient. Please refer to After Visit Summary for other counseling recommendations.  Return in about 4 weeks (around 03/13/2016) for OB visit.   Hermina StaggersMichael L Eydan Chianese, MD

## 2016-02-15 LAB — PAIN MGMT, PROFILE 6 CONF W/O MM, U
6 ACETYLMORPHINE: NEGATIVE ng/mL (ref <10)
ALCOHOL METABOLITES: NEGATIVE ng/mL (ref <500)
Amphetamines: NEGATIVE ng/mL (ref <500)
BENZODIAZEPINES: NEGATIVE ng/mL (ref <100)
Barbiturates: NEGATIVE ng/mL (ref <300)
Cocaine Metabolite: NEGATIVE ng/mL (ref <150)
Creatinine: 122.5 mg/dL (ref >=20.0)
METHADONE METABOLITE: NEGATIVE ng/mL (ref <100)
Marijuana Metabolite: NEGATIVE ng/mL (ref <20)
OXIDANT: NEGATIVE ug/mL (ref <200)
Opiates: NEGATIVE ng/mL (ref <100)
Oxycodone: NEGATIVE ng/mL (ref <100)
PH: 6.47 (ref 4.5–9.0)
PHENCYCLIDINE: NEGATIVE ng/mL (ref <25)
PLEASE NOTE: 0

## 2016-02-15 LAB — PRENATAL PROFILE (SOLSTAS)
Antibody Screen: NEGATIVE
BASOS PCT: 0 %
Basophils Absolute: 0 cells/uL (ref 0–200)
EOS PCT: 3 %
Eosinophils Absolute: 234 cells/uL (ref 15–500)
HEMATOCRIT: 37.8 % (ref 35.0–45.0)
HEMOGLOBIN: 12 g/dL (ref 11.7–15.5)
HEP B S AG: NEGATIVE
HIV: NONREACTIVE
LYMPHS ABS: 1950 {cells}/uL (ref 850–3900)
Lymphocytes Relative: 25 %
MCH: 31.7 pg (ref 27.0–33.0)
MCHC: 31.7 g/dL — AB (ref 32.0–36.0)
MCV: 99.7 fL (ref 80.0–100.0)
MONO ABS: 780 {cells}/uL (ref 200–950)
MPV: 10 fL (ref 7.5–12.5)
Monocytes Relative: 10 %
NEUTROS ABS: 4836 {cells}/uL (ref 1500–7800)
Neutrophils Relative %: 62 %
Platelets: 231 10*3/uL (ref 140–400)
RBC: 3.79 MIL/uL — AB (ref 3.80–5.10)
RDW: 13.3 % (ref 11.0–15.0)
Rh Type: POSITIVE
Rubella: 2.77 Index — ABNORMAL HIGH (ref <0.90)
WBC: 7.8 10*3/uL (ref 3.8–10.8)

## 2016-02-15 LAB — CULTURE, OB URINE: Organism ID, Bacteria: NO GROWTH

## 2016-02-16 ENCOUNTER — Encounter (HOSPITAL_COMMUNITY): Payer: Self-pay

## 2016-02-16 ENCOUNTER — Telehealth: Payer: Self-pay | Admitting: *Deleted

## 2016-02-16 LAB — CYSTIC FIBROSIS DIAGNOSTIC STUDY

## 2016-02-16 LAB — HEMOGLOBINOPATHY EVALUATION
HEMATOCRIT: 37.8 % (ref 35.0–45.0)
HGB A: 96.7 % (ref >96.0)
Hemoglobin: 12 g/dL (ref 11.7–15.5)
Hgb A2 Quant: 2.3 % (ref 1.8–3.5)
MCH: 31.7 pg (ref 27.0–33.0)
MCV: 99.7 fL (ref 80.0–100.0)
RBC: 3.79 MIL/uL — ABNORMAL LOW (ref 3.80–5.10)
RDW: 13.3 % (ref 11.0–15.0)

## 2016-02-16 LAB — CYTOLOGY - PAP
Chlamydia: NEGATIVE
Diagnosis: NEGATIVE
HPV (WINDOPATH): NOT DETECTED
Neisseria Gonorrhea: NEGATIVE

## 2016-02-16 NOTE — Telephone Encounter (Signed)
Pt left message that she would like to schedule another ultrasound. Called patient back and heard busy tone.

## 2016-02-20 ENCOUNTER — Telehealth: Payer: Self-pay

## 2016-02-20 NOTE — Telephone Encounter (Signed)
Patient called regarding her failed pregnancy. Pt states she never had the chance to ask questions about why her pregnancy failed and would like to know why her pregnancy failed.

## 2016-02-21 ENCOUNTER — Ambulatory Visit (HOSPITAL_COMMUNITY): Payer: Federal, State, Local not specified - PPO | Admitting: Anesthesiology

## 2016-02-21 ENCOUNTER — Encounter (HOSPITAL_COMMUNITY): Payer: Self-pay

## 2016-02-21 ENCOUNTER — Ambulatory Visit (HOSPITAL_COMMUNITY)
Admission: RE | Admit: 2016-02-21 | Discharge: 2016-02-21 | Disposition: A | Discharge status: Home / Self Care | Payer: Federal, State, Local not specified - PPO | Source: Ambulatory Visit | Attending: Family Medicine | Admitting: Family Medicine

## 2016-02-21 ENCOUNTER — Encounter (HOSPITAL_COMMUNITY)
Admission: RE | Disposition: A | Discharge status: Home / Self Care | Payer: Self-pay | Source: Ambulatory Visit | Attending: Family Medicine

## 2016-02-21 ENCOUNTER — Encounter: Payer: Self-pay | Admitting: Family Medicine

## 2016-02-21 DIAGNOSIS — T888 Other specified complications of surgical and medical care, not elsewhere classified: Secondary | ICD-10-CM | POA: Diagnosis not present

## 2016-02-21 DIAGNOSIS — O021 Missed abortion: Secondary | ICD-10-CM | POA: Diagnosis not present

## 2016-02-21 DIAGNOSIS — K219 Gastro-esophageal reflux disease without esophagitis: Secondary | ICD-10-CM | POA: Diagnosis not present

## 2016-02-21 DIAGNOSIS — Z79899 Other long term (current) drug therapy: Secondary | ICD-10-CM | POA: Diagnosis not present

## 2016-02-21 HISTORY — PX: DILATION AND EVACUATION: SHX1459

## 2016-02-21 HISTORY — DX: Anemia, unspecified: D64.9

## 2016-02-21 LAB — CBC
HEMATOCRIT: 36.5 % (ref 36.0–46.0)
HEMOGLOBIN: 12.4 g/dL (ref 12.0–15.0)
MCH: 32.1 pg (ref 26.0–34.0)
MCHC: 34 g/dL (ref 30.0–36.0)
MCV: 94.6 fL (ref 78.0–100.0)
Platelets: 224 10*3/uL (ref 150–400)
RBC: 3.86 MIL/uL — AB (ref 3.87–5.11)
RDW: 13 % (ref 11.5–15.5)
WBC: 7.6 10*3/uL (ref 4.0–10.5)

## 2016-02-21 SURGERY — DILATION AND EVACUATION, UTERUS
Anesthesia: General | Site: Vagina

## 2016-02-21 SURGERY — Surgical Case
Anesthesia: *Unknown

## 2016-02-21 MED ORDER — SCOPOLAMINE 1 MG/3DAYS TD PT72
1.0000 | MEDICATED_PATCH | Freq: Once | TRANSDERMAL | Status: DC
  Administered 2016-02-21: 1.5 mg via TRANSDERMAL

## 2016-02-21 MED ORDER — MIDAZOLAM HCL 2 MG/2ML IJ SOLN
INTRAMUSCULAR | Status: DC | PRN
Start: 2016-02-21 — End: 2016-02-21
  Administered 2016-02-21: 2 mg via INTRAVENOUS

## 2016-02-21 MED ORDER — FENTANYL CITRATE (PF) 100 MCG/2ML IJ SOLN: 25.0000 ug | INTRAMUSCULAR | Status: DC | PRN

## 2016-02-21 MED ORDER — IBUPROFEN 600 MG PO TABS: 600.0000 mg | ORAL_TABLET | Freq: Four times a day (QID) | ORAL | 0 refills | Status: DC | PRN

## 2016-02-21 MED ORDER — OXYCODONE-ACETAMINOPHEN 5-325 MG PO TABS: 1.0000 | ORAL_TABLET | Freq: Four times a day (QID) | ORAL | 0 refills | Status: DC | PRN

## 2016-02-21 MED ORDER — ONDANSETRON HCL 4 MG/2ML IJ SOLN
INTRAMUSCULAR | Status: DC | PRN
  Administered 2016-02-21: 4 mg via INTRAVENOUS

## 2016-02-21 MED ORDER — KETOROLAC TROMETHAMINE 30 MG/ML IJ SOLN
INTRAMUSCULAR | Status: DC | PRN
  Administered 2016-02-21: 30 mg via INTRAVENOUS

## 2016-02-21 MED ORDER — SUCCINYLCHOLINE CHLORIDE 200 MG/10ML IV SOSY
PREFILLED_SYRINGE | INTRAVENOUS | Status: AC
  Filled 2016-02-21: qty 10

## 2016-02-21 MED ORDER — SCOPOLAMINE 1 MG/3DAYS TD PT72
MEDICATED_PATCH | TRANSDERMAL | Status: AC
  Administered 2016-02-21: 1.5 mg via TRANSDERMAL
  Filled 2016-02-21: qty 1

## 2016-02-21 MED ORDER — FENTANYL CITRATE (PF) 100 MCG/2ML IJ SOLN
INTRAMUSCULAR | Status: DC | PRN
  Administered 2016-02-21: 100 ug via INTRAVENOUS

## 2016-02-21 MED ORDER — PROMETHAZINE HCL 25 MG/ML IJ SOLN: 6.2500 mg | INTRAMUSCULAR | Status: DC | PRN

## 2016-02-21 MED ORDER — LIDOCAINE HCL (CARDIAC) 20 MG/ML IV SOLN
INTRAVENOUS | Status: AC
  Filled 2016-02-21: qty 5

## 2016-02-21 MED ORDER — PROPOFOL 10 MG/ML IV BOLUS
INTRAVENOUS | Status: AC
  Filled 2016-02-21: qty 20

## 2016-02-21 MED ORDER — DOXYCYCLINE HYCLATE 100 MG PO TABS
ORAL_TABLET | ORAL | Status: AC
  Administered 2016-02-21: 200 mg via ORAL
  Filled 2016-02-21: qty 2

## 2016-02-21 MED ORDER — LACTATED RINGERS IV SOLN
INTRAVENOUS | Status: DC
  Administered 2016-02-21 (×2): via INTRAVENOUS

## 2016-02-21 MED ORDER — MIDAZOLAM HCL 2 MG/2ML IJ SOLN
INTRAMUSCULAR | Status: AC
  Filled 2016-02-21: qty 2

## 2016-02-21 MED ORDER — GLYCOPYRROLATE 0.2 MG/ML IJ SOLN
INTRAMUSCULAR | Status: AC
  Filled 2016-02-21: qty 1

## 2016-02-21 MED ORDER — MEPERIDINE HCL 25 MG/ML IJ SOLN: 6.2500 mg | INTRAMUSCULAR | Status: DC | PRN

## 2016-02-21 MED ORDER — DOXYCYCLINE HYCLATE 100 MG PO TABS
200.0000 mg | ORAL_TABLET | Freq: Once | ORAL | Status: AC
  Administered 2016-02-21: 200 mg via ORAL

## 2016-02-21 MED ORDER — DEXAMETHASONE SODIUM PHOSPHATE 10 MG/ML IJ SOLN
INTRAMUSCULAR | Status: DC | PRN
  Administered 2016-02-21: 4 mg via INTRAVENOUS

## 2016-02-21 MED ORDER — PROPOFOL 500 MG/50ML IV EMUL
INTRAVENOUS | Status: DC | PRN
  Administered 2016-02-21: 100 mg via INTRAVENOUS
  Administered 2016-02-21: 200 mg via INTRAVENOUS

## 2016-02-21 MED ORDER — 0.9 % SODIUM CHLORIDE (POUR BTL) OPTIME
TOPICAL | Status: DC | PRN
  Administered 2016-02-21: 1000 mL

## 2016-02-21 MED ORDER — FENTANYL CITRATE (PF) 100 MCG/2ML IJ SOLN
INTRAMUSCULAR | Status: AC
  Filled 2016-02-21: qty 2

## 2016-02-21 MED ORDER — KETOROLAC TROMETHAMINE 30 MG/ML IJ SOLN
INTRAMUSCULAR | Status: AC
  Filled 2016-02-21: qty 1

## 2016-02-21 MED ORDER — DEXAMETHASONE SODIUM PHOSPHATE 4 MG/ML IJ SOLN
INTRAMUSCULAR | Status: AC
  Filled 2016-02-21: qty 1

## 2016-02-21 MED ORDER — ONDANSETRON HCL 4 MG/2ML IJ SOLN
INTRAMUSCULAR | Status: AC
  Filled 2016-02-21: qty 2

## 2016-02-21 MED ORDER — BUPIVACAINE-EPINEPHRINE 0.25% -1:200000 IJ SOLN
INTRAMUSCULAR | Status: DC | PRN
  Administered 2016-02-21: 20 mL

## 2016-02-21 MED ORDER — LIDOCAINE HCL (CARDIAC) 20 MG/ML IV SOLN
INTRAVENOUS | Status: DC | PRN
  Administered 2016-02-21: 50 mg via INTRAVENOUS

## 2016-02-21 MED ORDER — BUPIVACAINE-EPINEPHRINE (PF) 0.25% -1:200000 IJ SOLN
INTRAMUSCULAR | Status: AC
  Filled 2016-02-21: qty 30

## 2016-02-21 MED ORDER — MIDAZOLAM HCL 2 MG/2ML IJ SOLN: 0.5000 mg | Freq: Once | INTRAMUSCULAR | Status: DC | PRN

## 2016-02-21 SURGICAL SUPPLY — 20 items
CATH ROBINSON RED A/P 16FR (CATHETERS) ×2 IMPLANT
CLOTH BEACON ORANGE TIMEOUT ST (SAFETY) ×2 IMPLANT
DECANTER SPIKE VIAL GLASS SM (MISCELLANEOUS) ×2 IMPLANT
GLOVE BIOGEL PI IND STRL 7.0 (GLOVE) ×1 IMPLANT
GLOVE BIOGEL PI IND STRL 7.5 (GLOVE) ×1 IMPLANT
GLOVE BIOGEL PI INDICATOR 7.0 (GLOVE) ×1
GLOVE BIOGEL PI INDICATOR 7.5 (GLOVE) ×1
GLOVE ECLIPSE 7.5 STRL STRAW (GLOVE) ×4 IMPLANT
GOWN STRL REUS W/TWL LRG LVL3 (GOWN DISPOSABLE) ×6 IMPLANT
KIT BERKELEY 1ST TRIMESTER 3/8 (MISCELLANEOUS) ×2 IMPLANT
NS IRRIG 1000ML POUR BTL (IV SOLUTION) ×2 IMPLANT
PACK VAGINAL MINOR WOMEN LF (CUSTOM PROCEDURE TRAY) ×2 IMPLANT
PAD OB MATERNITY 4.3X12.25 (PERSONAL CARE ITEMS) ×2 IMPLANT
PAD PREP 24X48 CUFFED NSTRL (MISCELLANEOUS) ×2 IMPLANT
SET BERKELEY SUCTION TUBING (SUCTIONS) ×2 IMPLANT
TOWEL OR 17X24 6PK STRL BLUE (TOWEL DISPOSABLE) ×4 IMPLANT
VACURETTE 10 RIGID CVD (CANNULA) ×2 IMPLANT
VACURETTE 7MM CVD STRL WRAP (CANNULA) IMPLANT
VACURETTE 8 RIGID CVD (CANNULA) IMPLANT
VACURETTE 9 RIGID CVD (CANNULA) IMPLANT

## 2016-02-21 NOTE — Discharge Instructions (Signed)
D&E (Dilation and Evacuation) Dilation and evacuation (D&E) is a minor operation. It involves stretching (dilation) the cervix and evacuation of the uterus. During the procedure, the cervix is dilated and tissue is gently suctioned from the inside of the uterus.  REASONS FOR DOING D&E Removal of retained placenta after giving birth.  Abortion. Miscarriage.  RISKS AND COMPLICATIONS Putting a hole (perforation) in the uterus. Excessive bleeding after the D&E.  Infection of the uterus.  Damage to the cervix.  Developing scar tissue (adhesions) inside the uterus, later causing abnormal bleeding or no monthly bleeding (amenorrhea) or problems with fertility. Complications from general or local anesthetic.     PROCEDURE You may be given a drug to make you sleep (general anesthetic) or a drug that numbs the area (local anesthetic) in and around the cervix.  You will lie on your back with your legs in stirrups.  A curved tool (suction curette) will be used to evacuate the uterus and will then be removed.  This usually takes around 15 to 30 minutes.  AFTER THE PROCEDURE You will rest in the recovery room until you are stable and feel ready to go home.  You may feel sick to your stomach (nauseous) or throw up (vomit) if you had general anesthesia.  You may have light cramping and bleeding for a couple days to 2 weeks after the procedure.  Your uterus needs to make new lining after a D&E. This may make your next period late.   HOME CARE INSTRUCTIONS Do not drive for 24 hours.  Wait 1 week before returning to strenuous activities.  You may resume your usual diet.  Drink enough water and fluids to keep your urine clear or pale yellow.  You should return to your usual bowel function. If constipation occurs, you may:  Take a mild laxative with permission from your caregiver.  Add fruit and bran to your diet.  Take showers instead of baths for two weeks Do not go swimming or use a hot tub until  your caregiver gives you permission.  Have someone with you or available for you the first 24 to 48 hours, especially if you had a general anesthetic.  Do not douche, use tampons, or have intercourse until after your follow-up appointment, or when your caregiver approves.  Only take over-the-counter or prescription medicines for pain, discomfort, or fever as directed by your caregiver. Do not take aspirin. It can cause bleeding.  If a prescription has been given to you, follow your caregiver's directions. You may be given a medicine that kills germs (antibiotic) to prevent an infection.  Keep all your follow-up appointments recommended by your caregiver.   SEEK MEDICAL CARE IF: You have increasing cramps or pain not relieved with medicine.  You develop belly (abdominal) pain, which does not seem to be related to the same area as your earlier cramping and pain.  You feel dizzy or feel like fainting.  You have a bad smelling vaginal discharge.  You develop a rash.  You develop a reaction or allergy to your medicine.   SEEK IMMEDIATE MEDICAL CARE IF: Bleeding is heavier than a normal menstrual period.  You have an oral temperature above 101F, not controlled by medicine.  You develop chest pain.  You develop shortness of breath.  You pass out.  You develop heavy vaginal bleeding with or without blood clots.   MAKE SURE YOU: Understand these instructions.  Will watch your condition.  Will get help right away if you are   shortness of breath.   You pass out.   You develop heavy vaginal bleeding with or without blood clots.   MAKE SURE YOU:  Understand these instructions.   Will watch your condition.   Will get help right away if you are not doing well or get worse.   UPDATED HEALTH PRACTICES  A Pap smear is done to screen for cervical cancer.   The first Pap smear should be done at age 32.   Between ages 221 and 3629, Pap smears are repeated every 2 years.   Beginning at age 32, you are advised to have a Pap smear every 3 years as long as your past 3 Pap smears have been normal.   Some women have medical problems  that increase the chance of getting cervical cancer. Talk to your caregiver about these problems. It is especially important to talk to your caregiver if a new problem develops soon after your last Pap smear. In these cases, your caregiver may recommend more frequent screening and Pap smears.   The above recommendations are the same for women who have or have not gotten the vaccine for HPV (human papillomavirus).   If you had a uterus removal (hysterectomy) for a problem that was not a cancer or a condition that could lead to cancer, then you no longer need Pap smears.   If you are between ages 8365 and 7370, and you have had normal Pap smears going back 10 years, you no longer need Pap smears.   If you have had past treatment for cervical cancer or a condition that could lead to cancer, you need Pap smears and screening for cancer for at least 20 years after your treatment.   Continue monthly breast self-examinations. Your caregiver can provide information and instructions for breast self-examination.  Exit[employer] Patient Information 2011 [employer] [place]     Post Anesthesia Home Care Instructions  Activity: Get plenty of rest for the remainder of the day. A responsible adult should stay with you for 24 hours following the procedure.  For the next 24 hours, DO NOT: -Drive a car -Advertising copywriterperate machinery -Drink alcoholic beverages -Take any medication unless instructed by your physician -Make any legal decisions or sign important papers.  Meals: Start with liquid foods such as gelatin or soup. Progress to regular foods as tolerated. Avoid greasy, spicy, heavy foods. If nausea and/or vomiting occur, drink only clear liquids until the nausea and/or vomiting subsides. Call your physician if vomiting continues.  Special Instructions/Symptoms: Your throat may feel dry or sore from the anesthesia or the breathing tube placed in your throat during surgery. If this causes discomfort, gargle with warm salt  water. The discomfort should disappear within 24 hours.  If you had a scopolamine patch placed behind your ear for the management of post- operative nausea and/or vomiting:  1. The medication in the patch is effective for 72 hours, after which it should be removed.  Wrap patch in a tissue and discard in the trash. Wash hands thoroughly with soap and water. 2. You may remove the patch earlier than 72 hours if you experience unpleasant side effects which may include dry mouth, dizziness or visual disturbances. 3. Avoid touching the patch. Wash your hands with soap and water after contact with the patch.

## 2016-02-21 NOTE — Anesthesia Preprocedure Evaluation (Addendum)
Anesthesia Evaluation  Patient identified by MRN, date of birth, ID band Patient awake    Reviewed: Allergy & Precautions, NPO status , Patient's Chart, lab work & pertinent test results  History of Anesthesia Complications Negative for: history of anesthetic complications  Airway Mallampati: II  TM Distance: >3 FB Neck ROM: Full    Dental  (+) Dental Advisory Given   Pulmonary neg pulmonary ROS,    Pulmonary exam normal        Cardiovascular negative cardio ROS Normal cardiovascular exam Rhythm:Regular Rate:Normal     Neuro/Psych negative neurological ROS     GI/Hepatic Neg liver ROS, GERD  Medicated and Controlled,  Endo/Other  Morbid obesity  Renal/GU negative Renal ROS     Musculoskeletal   Abdominal   Peds  Hematology negative hematology ROS (+)   Anesthesia Other Findings   Reproductive/Obstetrics (+) Pregnancy Missed Ab                            Anesthesia Physical Anesthesia Plan  ASA: II  Anesthesia Plan: General   Post-op Pain Management:    Induction: Intravenous  Airway Management Planned: LMA  Additional Equipment:   Intra-op Plan:   Post-operative Plan:   Informed Consent: I have reviewed the patients History and Physical, chart, labs and discussed the procedure including the risks, benefits and alternatives for the proposed anesthesia with the patient or authorized representative who has indicated his/her understanding and acceptance.   Dental advisory given  Plan Discussed with: CRNA and Surgeon  Anesthesia Plan Comments: (Plan routine monitors, GA- LMA OK)        Anesthesia Quick Evaluation

## 2016-02-21 NOTE — H&P (Signed)
Preoperative History and Physical  Krista [name] is a 30 y.o. [phone] here for surgical management of 11w missed AB.   No significant preoperative concerns.  Proposed surgery: Dilation and Evacuation  Past Medical History:  Diagnosis Date  . Anemia    History of  . Cyst of right kidney    Past Surgical History:  Procedure Laterality Date  . NO PAST SURGERIES     OB History    Gravida Para Term Preterm AB Living   '4 2 2   1 2   ' SAB TAB Ectopic Multiple Live Births     1     2     Patient denies any cervical dysplasia or STIs. No current facility-administered medications on file prior to encounter.    Current Outpatient Prescriptions on File Prior to Encounter  Medication Sig Dispense Refill  . Prenatal Vit-Fe Fumarate-FA (PRENATAL MULTIVITAMIN) TABS tablet Take 1 tablet by mouth daily at 12 noon.    . promethazine (PHENERGAN) 25 MG tablet Take 1 tablet (25 mg total) by mouth every 6 (six) hours as needed for nausea or vomiting. 30 tablet 0  . ondansetron (ZOFRAN ODT) 4 MG disintegrating tablet Take 1 tablet (4 mg total) by mouth every 6 (six) hours as needed for nausea. (Patient not taking: Reported on 02/14/2016) 20 tablet 0   No Known Allergies Social History:   reports that she has never smoked. She has never used smokeless tobacco. She reports that she drinks alcohol. She reports that she does not use drugs.  Family History  Problem Relation Age of Onset  . Asthma Sister     Review of Systems: Full 10 systems review of systems preformed, which were normal other than what was stated in the HPI.  PHYSICAL EXAM: Blood pressure 110/67, pulse 74, temperature 97 F (36.1 C), temperature source Oral, resp. rate 18, last menstrual period 12/04/2015, SpO2 100 %. General appearance - alert, well appearing, and in no distress Head - Normocephalic, atraumatic.  Right and left external ears normal. Eyes - EOMI.  Nonicteric.  Normal conjunctiva Neck - supple, no lymphadenopathy.   No tracheal deviation Chest - clear to auscultation, no wheezes, rales or rhonchi, symmetric air entry Heart - normal rate and regular rhythm Abdomen - soft, nontender, nondistended, no masses or organomegaly Pelvic - examination not indicated Extremities - peripheral pulses normal, no pedal edema, no clubbing or cyanosis Skin - Warm to touch. no bruises, rashes, wounds. Neuro - Oriented x3.  Cranial nerves intact. Psych - normal thought process.  Judgement intact.  Labs: Results for orders placed or performed during the hospital encounter of 02/21/16 (from the past 336 hour(s))  CBC   Collection Time: 02/21/16  7:52 AM  Result Value Ref Range   WBC 7.6 4.0 - 10.5 K/uL   RBC 3.86 (L) 3.87 - 5.11 MIL/uL   Hemoglobin 12.4 12.0 - 15.0 g/dL   HCT 36.5 36.0 - 46.0 %   MCV 94.6 78.0 - 100.0 fL   MCH 32.1 26.0 - 34.0 pg   MCHC 34.0 30.0 - 36.0 g/dL   RDW 13.0 11.5 - 15.5 %   Platelets 224 150 - 400 K/uL  Results for orders placed or performed in visit on 02/14/16 (from the past 336 hour(s))  Cytology - PAP   Collection Time: 02/14/16 12:00 AM  Result Value Ref Range   Adequacy      Satisfactory for evaluation  endocervical/transformation zone component PRESENT.   Diagnosis  NEGATIVE FOR INTRAEPITHELIAL LESIONS OR MALIGNANCY.   HPV NOT DETECTED    Chlamydia Negative    Neisseria gonorrhea Negative    Material Submitted CervicoVaginal Pap [ThinPrep Imaged]    CYTOLOGY - PAP PAP RESULT   POCT urinalysis dip (device)   Collection Time: 02/14/16  8:22 AM  Result Value Ref Range   Glucose, UA NEGATIVE NEGATIVE mg/dL   Bilirubin Urine NEGATIVE NEGATIVE   Ketones, ur NEGATIVE NEGATIVE mg/dL   Specific Gravity, Urine 1.025 1.005 - 1.030   Hgb urine dipstick NEGATIVE NEGATIVE   pH 5.5 5.0 - 8.0   Protein, ur NEGATIVE NEGATIVE mg/dL   Urobilinogen, UA 0.2 0.0 - 1.0 mg/dL   Nitrite NEGATIVE NEGATIVE   Leukocytes, UA NEGATIVE NEGATIVE  Prenatal Profile   Collection Time: 02/14/16   9:34 AM  Result Value Ref Range   WBC 7.8 3.8 - 10.8 K/uL   RBC 3.79 (L) 3.80 - 5.10 MIL/uL   Hemoglobin 12.0 11.7 - 15.5 g/dL   HCT 37.8 35.0 - 45.0 %   MCV 99.7 80.0 - 100.0 fL   MCH 31.7 27.0 - 33.0 pg   MCHC 31.7 (L) 32.0 - 36.0 g/dL   RDW 13.3 11.0 - 15.0 %   Platelets 231 140 - 400 K/uL   MPV 10.0 7.5 - 12.5 fL   Neutro Abs 4,836 1,500 - 7,800 cells/uL   Lymphs Abs 1,950 850 - 3,900 cells/uL   Monocytes Absolute 780 200 - 950 cells/uL   Eosinophils Absolute 234 15 - 500 cells/uL   Basophils Absolute 0 0 - 200 cells/uL   Neutrophils Relative % 62 %   Lymphocytes Relative 25 %   Monocytes Relative 10 %   Eosinophils Relative 3 %   Basophils Relative 0 %   Smear Review Criteria for review not met    Hepatitis B Surface Ag NEGATIVE NEGATIVE   HIV 1&2 Ab, 4th Generation NONREACTIVE NONREACTIVE   RPR Ser Ql NON REAC NON REAC   Rubella 2.77 (H) <0.90 Index   ABO Grouping B    Rh Type POS    Antibody Screen NEG NEGATIVE  Hemoglobinopathy Evaluation   Collection Time: 02/14/16  9:34 AM  Result Value Ref Range   RBC 3.79 (L) 3.80 - 5.10 MIL/uL   Hemoglobin 12.0 11.7 - 15.5 g/dL   HCT 37.8 35.0 - 45.0 %   MCV 99.7 80.0 - 100.0 fL   MCH 31.7 27.0 - 33.0 pg   RDW 13.3 11.0 - 15.0 %   Hgb A 96.7 >96.0 %   Hgb F Quant <1.0 <2.0 %   Hgb A2 Quant 2.3 1.8 - 3.5 %   Interpretation SEE NOTE   Cystic fibrosis diagnostic study   Collection Time: 02/14/16  9:34 AM  Result Value Ref Range   Result Summary: SEE NOTE    Interpretation-CFDNA: SEE NOTE   Pain Mgmt, Profile 6 Conf w/o mM, U   Collection Time: 02/14/16  1:37 PM  Result Value Ref Range   Creatinine 122.5 > or = 20.0 mg/dL   pH 6.47 4.5 - 9.0   Oxidant NEGATIVE <200 mcg/mL   Amphetamines NEGATIVE <500 ng/mL   Barbiturates NEGATIVE <300 ng/mL   Benzodiazepines NEGATIVE <100 ng/mL   Marijuana Metabolite NEGATIVE <20 ng/mL   Cocaine Metabolite NEGATIVE <150 ng/mL   Methadone Metabolite NEGATIVE <100 ng/mL   Opiates  NEGATIVE <100 ng/mL   Oxycodone NEGATIVE <100 ng/mL   Phencyclidine NEGATIVE <25 ng/mL   Alcohol Metabolites NEGATIVE <500 ng/mL  Please note: .    6 Acetylmorphine NEGATIVE <10 ng/mL  Culture, OB Urine   Collection Time: 02/14/16  2:24 PM  Result Value Ref Range   Organism ID, Bacteria NO GROWTH     Imaging Studies: US Ob Comp Less 14 Wks  Result Date: 02/14/2016 CLINICAL DATA:  Pregnant, for viability EXAM: OBSTETRIC <14 WK [place] AND TRANSVAGINAL OB US TECHNIQUE: Both transabdominal and transvaginal ultrasound examinations were performed for complete evaluation of the gestation as well as the maternal uterus, adnexal regions, and pelvic cul-de-sac. Transvaginal technique was performed to assess early pregnancy. COMPARISON:  None. FINDINGS: Intrauterine gestational sac: Single Yolk sac:  Visualized. Embryo:  Visualized. Cardiac Activity: Not Visualized. CRL:  16.0  mm   7 w   6 d                  [place] EDC: 09/26/2016 Subchorionic hemorrhage:  None visualized. Maternal uterus/adnexae: Bilateral ovaries are within normal limits. Trace pelvic fluid. IMPRESSION: Single injury gestation, measuring 7 weeks 6 days by crown-rump length, without cardiac activity. Findings meet definitive criteria for failed pregnancy. This follows SRU consensus guidelines: Diagnostic Criteria for Nonviable Pregnancy Early in the First Trimester. Alison Stalling J Med [phone]. These results will be called to the ordering clinician or representative by the Radiologist Assistant, and communication documented in the PACS or zVision Dashboard. Electronically Signed   By: Julian Hy M.D.   On: 02/14/2016 11:32   US Ob Transvaginal  Result Date: 02/14/2016 CLINICAL DATA:  Pregnant, for viability EXAM: OBSTETRIC <14 WK [place] AND TRANSVAGINAL OB US TECHNIQUE: Both transabdominal and transvaginal ultrasound examinations were performed for complete evaluation of the gestation as well as the maternal uterus, adnexal regions, and  pelvic cul-de-sac. Transvaginal technique was performed to assess early pregnancy. COMPARISON:  None. FINDINGS: Intrauterine gestational sac: Single Yolk sac:  Visualized. Embryo:  Visualized. Cardiac Activity: Not Visualized. CRL:  16.0  mm   7 w   6 d                  [place] EDC: 09/26/2016 Subchorionic hemorrhage:  None visualized. Maternal uterus/adnexae: Bilateral ovaries are within normal limits. Trace pelvic fluid. IMPRESSION: Single injury gestation, measuring 7 weeks 6 days by crown-rump length, without cardiac activity. Findings meet definitive criteria for failed pregnancy. This follows SRU consensus guidelines: Diagnostic Criteria for Nonviable Pregnancy Early in the First Trimester. Alison Stalling J Med [phone]. These results will be called to the ordering clinician or representative by the Radiologist Assistant, and communication documented in the PACS or zVision Dashboard. Electronically Signed   By: Julian Hy M.D.   On: 02/14/2016 11:32    Assessment: Patient Active Problem List   Diagnosis Date Noted  . Supervision of low-risk pregnancy 02/14/2016    Plan: Patient will undergo surgical management with dilation and evacuation.   The risks of surgery were discussed in detail with the patient including but not limited to: bleeding which may require transfusion or reoperation; infection which may require antibiotics; injury to surrounding organs which may involve bowel, bladder, ureters ; need for additional procedures including laparoscopy or laparotomy; thromboembolic phenomenon, surgical site problems and other postoperative/anesthesia complications. Likelihood of success in alleviating the patient's condition was discussed. Routine postoperative instructions will be reviewed with the patient and her family in detail after surgery.  The patient concurred with the proposed plan, giving informed written consent for the surgery.  Patient has been NPO since last night she will remain NPO  for procedure.  Anesthesia and OR aware. SCDs ordered on call to the OR.  To OR when ready.  Truett Mainland, DO  02/21/2016, 8:54 AM

## 2016-02-21 NOTE — Anesthesia Postprocedure Evaluation (Signed)
Anesthesia Post Note  Patient: Krista [name]  Procedure(s) Performed: Procedure(s) (LRB): DILATATION AND EVACUATION (N/A)  Patient location during evaluation: PACU Anesthesia Type: General Level of consciousness: awake and alert, oriented and patient cooperative Pain management: pain level controlled Vital Signs Assessment: post-procedure vital signs reviewed and stable Respiratory status: spontaneous breathing, nonlabored ventilation and respiratory function stable Cardiovascular status: blood pressure returned to baseline and stable Postop Assessment: no signs of nausea or vomiting Anesthetic complications: no        Last Vitals:  Vitals:   02/21/16 1042 02/21/16 1118  BP:  (!) 106/56  Pulse: 84 71  Resp: 18 20  Temp: 36.8 C     Last Pain:  Vitals:   02/21/16 1118  TempSrc:   PainSc: 2    Pain Goal: Patients Stated Pain Goal: 3 (02/21/16 0755)               Erling CruzJACKSON,E. Cyndal Kasson

## 2016-02-21 NOTE — Op Note (Addendum)
Krista BaileySharon C Hewlett PROCEDURE DATE: 02/21/2016  PREOPERATIVE DIAGNOSIS: 11 week missed abortion. POSTOPERATIVE DIAGNOSIS: The same. PROCEDURE:     Dilation and Evacuation. SURGEON:  Dr. Adrian BlackwaterStinson  INDICATIONS: 30 y.o. G4P2012with MAB at [redacted] weeks gestation, needing surgical completion.  Risks of surgery were discussed with the patient including but not limited to: bleeding which may require transfusion; infection which may require antibiotics; injury to uterus or surrounding organs;need for additional procedures including laparotomy or laparoscopy; possibility of intrauterine scarring which may impair future fertility; and other postoperative/anesthesia complications. Written informed consent was obtained.    FINDINGS:  A 11 size midline uterus, moderate amounts of products of conception, specimen sent to pathology.  ANESTHESIA:    Monitored intravenous sedation, paracervical block. INTRAVENOUS FLUIDS:  1000 ml of LR ESTIMATED BLOOD LOSS:  Less than 50 ml. SPECIMENS:  Products of conception sent to pathology COMPLICATIONS:  None immediate.  PROCEDURE DETAILS:  The patient received intravenous antibiotics while in the preoperative area.  She was then taken to the operating room where general anesthesia was administered and was found to be adequate.  After an adequate timeout was performed, she was placed in the dorsal lithotomy position and examined; then prepped and draped in the sterile manner.   Her bladder was catheterized for an unmeasured amount of clear, yellow urine. A vaginal speculum was then placed in the patient's vagina and a paracervical block using 0.25% Marcaine was administered.  A single tooth tenaculum was then applied to the anterior lip of the cervix. The cervix was gently dilated to accommodate a 10 mm suction curette that was gently advanced to the uterine fundus.  The suction device was then activated and curette slowly rotated to clear the uterus of products of conception.  A sharp  curettage was then performed to confirm completer emptying of the uterus.There was minimal bleeding noted and the tenaculum removed with good hemostasis noted.  The patient tolerated the procedure well.  The patient was taken to the recovery area in stable condition.   Levie HeritageJacob J Fraser Busche, DO 02/21/2016 9:39 AM

## 2016-02-21 NOTE — Anesthesia Procedure Notes (Signed)
Procedure Name: LMA Insertion Date/Time: 02/21/2016 9:05 AM Performed by: Shanon PayorGREGORY, Cloria Ciresi M Pre-anesthesia Checklist: Patient identified, Emergency Drugs available, Suction available, Timeout performed and Patient being monitored Patient Re-evaluated:Patient Re-evaluated prior to inductionOxygen Delivery Method: Circle system utilized Preoxygenation: Pre-oxygenation with 100% oxygen Intubation Type: IV induction LMA: LMA inserted LMA Size: 4.0 Number of attempts: 1 Tube secured with: Tape Dental Injury: Teeth and Oropharynx as per pre-operative assessment

## 2016-02-21 NOTE — Transfer of Care (Signed)
Immediate Anesthesia Transfer of Care Note  Patient: Krista [name]  Procedure(s) Performed: Procedure(s): DILATATION AND EVACUATION (N/A)  Patient Location: PACU  Anesthesia Type:General  Level of Consciousness: awake, alert  and oriented  Airway & Oxygen Therapy: Patient Spontanous Breathing and Patient connected to nasal cannula oxygen  Post-op Assessment: Report given to RN and Post -op Vital signs reviewed and stable  Post vital signs: Reviewed and stable  Last Vitals:  Vitals:   02/21/16 0755  BP: 110/67  Pulse: 74  Resp: 18  Temp: 36.1 C    Last Pain:  Vitals:   02/21/16 0755  TempSrc: Oral      Patients Stated Pain Goal: 3 (02/21/16 0755)  Complications: No apparent anesthesia complications

## 2016-02-22 ENCOUNTER — Encounter (HOSPITAL_COMMUNITY): Payer: Self-pay | Admitting: Family Medicine

## 2016-02-29 NOTE — Telephone Encounter (Signed)
I called Krista [name] and left a message we are returning her call and if she still needs our assistance to call our office and leave another message. Per chart review do not see that she has had a mau/ed visit.

## 2016-03-04 NOTE — Telephone Encounter (Signed)
I called Krista [name] again and left a message I am returning her call  for the second time- if you still have a need- call our office again and leave another message.

## 2016-03-07 ENCOUNTER — Ambulatory Visit (INDEPENDENT_AMBULATORY_CARE_PROVIDER_SITE_OTHER): Payer: Federal, State, Local not specified - PPO | Admitting: Family Medicine

## 2016-03-07 ENCOUNTER — Encounter: Payer: Self-pay | Admitting: Family Medicine

## 2016-03-07 VITALS — BP 100/65 | HR 63 | Ht 64.0 in | Wt 185.0 lb

## 2016-03-07 DIAGNOSIS — Z09 Encounter for follow-up examination after completed treatment for conditions other than malignant neoplasm: Secondary | ICD-10-CM

## 2016-03-07 DIAGNOSIS — Z30430 Encounter for insertion of intrauterine contraceptive device: Secondary | ICD-10-CM

## 2016-03-07 DIAGNOSIS — Z30014 Encounter for initial prescription of intrauterine contraceptive device: Secondary | ICD-10-CM

## 2016-03-07 MED ORDER — LEVONORGESTREL 18.6 MCG/DAY IU IUD
INTRAUTERINE_SYSTEM | Freq: Once | INTRAUTERINE | Status: AC
  Administered 2016-03-07: 1 via INTRAUTERINE

## 2016-03-07 NOTE — Progress Notes (Signed)
   Subjective:    Patient ID: Krista [name], female    DOB: 08-26-1985, 31 y.o.   MRN: [id number]  HPI Patient seen 2 weeks post op from D&E for MAB. Patient doing well, no concerns or problems. Not sexually active again. Heterosexual relationship. Would like birth control. Bleeding controlled.   Review of Systems     Objective:   Physical Exam  Constitutional: She appears well-developed and well-nourished.  HENT:  Head: Normocephalic and atraumatic.  Abdominal: Soft. She exhibits no distension. There is no tenderness.  Genitourinary: There is no rash, tenderness or lesion on the right labia. There is no rash, tenderness or lesion on the left labia. Cervix exhibits no motion tenderness, no discharge and no friability. No erythema, tenderness or bleeding in the vagina. No foreign body in the vagina. No signs of injury around the vagina. No vaginal discharge found.          Assessment & Plan:  1. Postop check Discussed contraception. Would like IUD.   2. Encounter for initial prescription of intrauterine contraceptive device (IUD)  - levonorgestrel (LILETTA) 18.6 MCG/DAY IUD; by Intrauterine route once.     IUD Procedure Note Patient identified, informed consent performed, signed copy in chart, time out was performed.  Urine pregnancy test negative.  Speculum placed in the vagina.  Cervix visualized.  Cleaned with Betadine x 2.  Grasped anteriorly with a single tooth tenaculum.  Uterus sounded to 8 cm.  Liletta  IUD placed per manufacturer's recommendations.  Strings trimmed to 3 cm. Tenaculum was removed, good hemostasis noted.  Patient tolerated procedure well.   Patient given post procedure instructions and Liletta care card with expiration date.  Patient is asked to check IUD strings periodically and follow up in 4-6 weeks for IUD check.

## 2016-03-07 NOTE — Patient Instructions (Signed)

## 2016-04-01 ENCOUNTER — Ambulatory Visit (INDEPENDENT_AMBULATORY_CARE_PROVIDER_SITE_OTHER): Payer: Federal, State, Local not specified - PPO | Admitting: Family Medicine

## 2016-04-01 VITALS — BP 116/62 | HR 69 | Ht 64.0 in | Wt 189.8 lb

## 2016-04-01 DIAGNOSIS — N898 Other specified noninflammatory disorders of vagina: Secondary | ICD-10-CM | POA: Diagnosis not present

## 2016-04-01 DIAGNOSIS — Z113 Encounter for screening for infections with a predominantly sexual mode of transmission: Secondary | ICD-10-CM

## 2016-04-01 DIAGNOSIS — T8384XA Pain from genitourinary prosthetic devices, implants and grafts, initial encounter: Secondary | ICD-10-CM

## 2016-04-01 MED ORDER — FLUCONAZOLE 150 MG PO TABS: 150.0000 mg | ORAL_TABLET | Freq: Once | ORAL | 3 refills | Status: AC

## 2016-04-01 NOTE — Progress Notes (Signed)
   Subjective:    Patient ID: Krista BaileySharon C [name], female    DOB: [birth year], 31 y.o.   MRN: [id number]  HPI Patient seen for vaginal pain and IUD check. 3 times a day was placed on 1/11. The patient had intercourse once and had pain. She has not had intercourse since then. She also reports having significant vaginal irritation since that time. No vaginal discharge   Review of Systems     Objective:   Physical Exam  Constitutional: She appears well-developed and well-nourished.  Abdominal: Soft. She exhibits no distension. There is tenderness. There is no rebound (mild suprapubic tenderness) and no guarding.  Genitourinary: There is no rash, tenderness, lesion or injury on the right labia. There is no rash, tenderness, lesion or injury on the left labia. Cervix exhibits no motion tenderness. Right adnexum displays no mass, no tenderness and no fullness. Left adnexum displays no mass, no tenderness and no fullness. No erythema, tenderness or bleeding in the vagina. No foreign body in the vagina. No signs of injury around the vagina. Vaginal discharge ( clumpy white discharge) found.    Skin: Skin is warm and dry.  Psychiatric: She has a normal mood and affect. Her behavior is normal. Judgment and thought content normal.      Assessment & Plan:  1. Pain due to intrauterine contraceptive device (IUD), initial encounter ([facility]) Possibly secondary to infection. Cultures obtained. Will treat yeast vulvar vaginitis.  2. Vaginal discharge Diflucan prescribed - Cervicovaginal ancillary only

## 2016-04-02 LAB — CERVICOVAGINAL ANCILLARY ONLY
BACTERIAL VAGINITIS: NEGATIVE
CANDIDA VAGINITIS: POSITIVE — AB
Chlamydia: NEGATIVE
NEISSERIA GONORRHEA: NEGATIVE
Trichomonas: NEGATIVE

## 2016-04-05 ENCOUNTER — Encounter: Payer: Self-pay | Admitting: Family Medicine

## 2016-04-05 ENCOUNTER — Ambulatory Visit: Payer: Federal, State, Local not specified - PPO | Admitting: Family Medicine

## 2016-04-09 DIAGNOSIS — K080 Exfoliation of teeth due to systemic causes: Secondary | ICD-10-CM | POA: Diagnosis not present

## 2016-04-29 ENCOUNTER — Ambulatory Visit: Payer: Federal, State, Local not specified - PPO | Admitting: Family Medicine

## 2016-05-14 DIAGNOSIS — K080 Exfoliation of teeth due to systemic causes: Secondary | ICD-10-CM | POA: Diagnosis not present

## 2016-05-21 DIAGNOSIS — K080 Exfoliation of teeth due to systemic causes: Secondary | ICD-10-CM | POA: Diagnosis not present

## 2016-06-24 ENCOUNTER — Ambulatory Visit: Payer: Self-pay | Admitting: Allergy

## 2016-09-11 DIAGNOSIS — K080 Exfoliation of teeth due to systemic causes: Secondary | ICD-10-CM | POA: Diagnosis not present

## 2016-09-18 DIAGNOSIS — Z87440 Personal history of urinary (tract) infections: Secondary | ICD-10-CM | POA: Diagnosis not present

## 2016-09-18 DIAGNOSIS — Z975 Presence of (intrauterine) contraceptive device: Secondary | ICD-10-CM | POA: Diagnosis not present

## 2016-09-18 DIAGNOSIS — M545 Low back pain: Secondary | ICD-10-CM | POA: Diagnosis not present

## 2016-09-18 DIAGNOSIS — Z79899 Other long term (current) drug therapy: Secondary | ICD-10-CM | POA: Diagnosis not present

## 2016-09-18 DIAGNOSIS — N3281 Overactive bladder: Secondary | ICD-10-CM | POA: Diagnosis not present

## 2016-11-20 DIAGNOSIS — R3121 Asymptomatic microscopic hematuria: Secondary | ICD-10-CM | POA: Diagnosis not present

## 2016-11-20 DIAGNOSIS — R1084 Generalized abdominal pain: Secondary | ICD-10-CM | POA: Diagnosis not present

## 2016-11-21 ENCOUNTER — Encounter: Payer: Self-pay | Admitting: Family Medicine

## 2016-11-21 ENCOUNTER — Ambulatory Visit (INDEPENDENT_AMBULATORY_CARE_PROVIDER_SITE_OTHER): Payer: Federal, State, Local not specified - PPO | Admitting: Family Medicine

## 2016-11-21 VITALS — BP 136/73 | HR 59 | Ht 64.0 in | Wt 190.0 lb

## 2016-11-21 DIAGNOSIS — Z30432 Encounter for removal of intrauterine contraceptive device: Secondary | ICD-10-CM

## 2016-11-21 MED ORDER — LO LOESTRIN FE 1 MG-10 MCG / 10 MCG PO TABS: 1.0000 | ORAL_TABLET | Freq: Every day | ORAL | 3 refills | Status: DC

## 2016-11-21 MED ORDER — NORETHIN-ETH ESTRAD-FE BIPHAS 1 MG-10 MCG / 10 MCG PO TABS: 1.0000 | ORAL_TABLET | Freq: Every day | ORAL | 11 refills | Status: DC

## 2016-11-21 NOTE — Progress Notes (Signed)
Patient wants IUD removed - pain with intercourse. Spotting. Would like OCPs. Lo loestrin prescribed.  IUD Removal  Patient was in the dorsal lithotomy position, normal external genitalia was noted.  A speculum was placed in the patient's vagina, normal discharge was noted, no lesions. The multiparous cervix was visualized, no lesions, no abnormal discharge,  and was swabbed with Betadine using scopettes.  The strings of the IUD was grasped and pulled using ring forceps.  The IUD was successfully removed in its entirety.  Patient tolerated the procedure well.

## 2016-11-25 HISTORY — PX: IUD REMOVAL: SHX5392

## 2016-11-26 DIAGNOSIS — R3121 Asymptomatic microscopic hematuria: Secondary | ICD-10-CM | POA: Diagnosis not present

## 2016-11-26 DIAGNOSIS — N200 Calculus of kidney: Secondary | ICD-10-CM | POA: Diagnosis not present

## 2016-11-27 DIAGNOSIS — R1084 Generalized abdominal pain: Secondary | ICD-10-CM | POA: Diagnosis not present

## 2016-11-27 DIAGNOSIS — N200 Calculus of kidney: Secondary | ICD-10-CM | POA: Diagnosis not present

## 2016-11-27 DIAGNOSIS — N2889 Other specified disorders of kidney and ureter: Secondary | ICD-10-CM | POA: Diagnosis not present

## 2016-11-27 DIAGNOSIS — R3121 Asymptomatic microscopic hematuria: Secondary | ICD-10-CM | POA: Diagnosis not present

## 2016-12-18 DIAGNOSIS — K080 Exfoliation of teeth due to systemic causes: Secondary | ICD-10-CM | POA: Diagnosis not present

## 2016-12-31 DIAGNOSIS — R1084 Generalized abdominal pain: Secondary | ICD-10-CM | POA: Diagnosis not present

## 2016-12-31 DIAGNOSIS — N200 Calculus of kidney: Secondary | ICD-10-CM | POA: Diagnosis not present

## 2017-01-01 ENCOUNTER — Other Ambulatory Visit: Payer: Self-pay | Admitting: Urology

## 2017-01-28 DIAGNOSIS — R1084 Generalized abdominal pain: Secondary | ICD-10-CM | POA: Diagnosis not present

## 2017-01-28 DIAGNOSIS — N2889 Other specified disorders of kidney and ureter: Secondary | ICD-10-CM | POA: Diagnosis not present

## 2017-02-04 NOTE — Patient Instructions (Signed)
Krista BaileySharon C [name]  02/04/2017   Your procedure is scheduled on: 02-07-17  [facility]  Entrance [place]  elevators to 3rd floor to  Short Stay Center at 530AM.    Call this number if you have problems the morning of surgery [phone]    Remember: ONLY 1 PERSON MAY GO WITH YOU TO SHORT STAY TO GET  READY MORNING OF YOUR SURGERY.    Do not eat food After Midnight ON Wednesday 02-05-17. DRINK PLENTY OF CLEAR LIQUIDS ON THURSDAY 02-06-17 AND FOLLOW ALL BOWEL PREP INSTRUCTIONS PROVIDED BY YOUR SURGEON. NOTHING BY MOUTH AFTER MIDNIGHT Thursday !     Take these medicines the morning of surgery with A SIP OF WATER: NONE                                You may not have any metal on your body including hair pins and              piercings  Do not wear jewelry, make-up, lotions, powders or perfumes, deodorant             Do not wear nail polish.  Do not shave  48 hours prior to surgery.     Do not bring valuables to the hospital. [facility] IS NOT             RESPONSIBLE   FOR VALUABLES.  Contacts, dentures or bridgework may not be worn into surgery.  Leave suitcase in the car. After surgery it may be brought to your room.                Please read over the following fact sheets you were given: _____________________________________________________________________    CLEAR LIQUID DIET   Foods Allowed                                                                     Foods Excluded  Coffee and tea, regular and decaf                             liquids that you cannot  Plain Jell-O in any flavor                                             see through such as: Fruit ices (not with fruit pulp)                                     milk, soups, orange juice  Iced Popsicles                                    All solid food Carbonated beverages, regular and diet  Cranberry, grape and apple juices Sports drinks like  Gatorade Lightly seasoned clear broth or consume(fat free) Sugar, honey syrup  Sample Menu Breakfast                                Lunch                                     Supper Cranberry juice                    Beef broth                            Chicken broth Jell-O                                     Grape juice                           Apple juice Coffee or tea                        Jell-O                                      Popsicle                                                Coffee or tea                        Coffee or tea  _____________________________________________________________________  [facility] - Preparing for Surgery Before surgery, you can play an important role.  Because skin is not sterile, your skin needs to be as free of germs as possible.  You can reduce the number of germs on your skin by washing with CHG (chlorahexidine gluconate) soap before surgery.  CHG is an antiseptic cleaner which kills germs and bonds with the skin to continue killing germs even after washing. Please DO NOT use if you have an allergy to CHG or antibacterial soaps.  If your skin becomes reddened/irritated stop using the CHG and inform your nurse when you arrive at Short Stay. Do not shave (including legs and underarms) for at least 48 hours prior to the first CHG shower.  You may shave your face/neck. Please follow these instructions carefully:  1.  Shower with CHG Soap the night before surgery and the  morning of Surgery.  2.  If you choose to wash your hair, wash your hair first as usual with your  normal  shampoo.  3.  After you shampoo, rinse your hair and body thoroughly to remove the  shampoo.                           4.  Use CHG as you would any other liquid soap.  You can apply chg directly  to the skin and wash  Gently with a scrungie or clean washcloth.  5.  Apply the CHG Soap to your body ONLY FROM THE NECK DOWN.   Do not use on face/ open                            Wound or open sores. Avoid contact with eyes, ears mouth and genitals (private parts).                       Wash face,  Genitals (private parts) with your normal soap.             6.  Wash thoroughly, paying special attention to the area where your surgery  will be performed.  7.  Thoroughly rinse your body with warm water from the neck down.  8.  DO NOT shower/wash with your normal soap after using and rinsing off  the CHG Soap.                9.  Pat yourself dry with a clean towel.            10.  Wear clean pajamas.            11.  Place clean sheets on your bed the night of your first shower and do not  sleep with pets. Day of Surgery : Do not apply any lotions/deodorants the morning of surgery.  Please wear clean clothes to the hospital/surgery center.  FAILURE TO FOLLOW THESE INSTRUCTIONS MAY RESULT IN THE CANCELLATION OF YOUR SURGERY PATIENT SIGNATURE_________________________________  NURSE SIGNATURE__________________________________  ________________________________________________________________________

## 2017-02-05 ENCOUNTER — Encounter (HOSPITAL_COMMUNITY): Payer: Self-pay | Admitting: Emergency Medicine

## 2017-02-05 ENCOUNTER — Encounter (HOSPITAL_COMMUNITY)
Admission: RE | Admit: 2017-02-05 | Discharge: 2017-02-05 | Disposition: A | Discharge status: Home / Self Care | Payer: Federal, State, Local not specified - PPO | Source: Ambulatory Visit | Attending: Urology | Admitting: Urology

## 2017-02-05 ENCOUNTER — Other Ambulatory Visit: Payer: Self-pay

## 2017-02-05 DIAGNOSIS — Z6831 Body mass index (BMI) 31.0-31.9, adult: Secondary | ICD-10-CM | POA: Diagnosis not present

## 2017-02-05 DIAGNOSIS — Z01810 Encounter for preprocedural cardiovascular examination: Secondary | ICD-10-CM | POA: Diagnosis not present

## 2017-02-05 DIAGNOSIS — N200 Calculus of kidney: Secondary | ICD-10-CM | POA: Diagnosis not present

## 2017-02-05 DIAGNOSIS — Z23 Encounter for immunization: Secondary | ICD-10-CM | POA: Diagnosis not present

## 2017-02-05 DIAGNOSIS — Z0183 Encounter for blood typing: Secondary | ICD-10-CM | POA: Diagnosis not present

## 2017-02-05 DIAGNOSIS — Z01812 Encounter for preprocedural laboratory examination: Secondary | ICD-10-CM | POA: Diagnosis not present

## 2017-02-05 DIAGNOSIS — Z01811 Encounter for preprocedural respiratory examination: Secondary | ICD-10-CM | POA: Diagnosis not present

## 2017-02-05 LAB — BASIC METABOLIC PANEL
ANION GAP: 6 (ref 5–15)
BUN: 10 mg/dL (ref 6–20)
CHLORIDE: 107 mmol/L (ref 101–111)
CO2: 28 mmol/L (ref 22–32)
Calcium: 9.3 mg/dL (ref 8.9–10.3)
Creatinine, Ser: 0.66 mg/dL (ref 0.44–1.00)
GFR calc Af Amer: >60 mL/min (ref >60)
GFR calc non Af Amer: >60 mL/min (ref >60)
Glucose, Bld: 89 mg/dL (ref 65–99)
POTASSIUM: 4.6 mmol/L (ref 3.5–5.1)
SODIUM: 141 mmol/L (ref 135–145)

## 2017-02-05 LAB — CBC
HCT: 39.2 % (ref 36.0–46.0)
Hemoglobin: 13.1 g/dL (ref 12.0–15.0)
MCH: 32.5 pg (ref 26.0–34.0)
MCHC: 33.4 g/dL (ref 30.0–36.0)
MCV: 97.3 fL (ref 78.0–100.0)
PLATELETS: 236 10*3/uL (ref 150–400)
RBC: 4.03 MIL/uL (ref 3.87–5.11)
RDW: 12.7 % (ref 11.5–15.5)
WBC: 7.6 10*3/uL (ref 4.0–10.5)

## 2017-02-05 LAB — HCG, SERUM, QUALITATIVE: PREG SERUM: NEGATIVE

## 2017-02-05 LAB — ABO/RH: ABO/RH(D): B POS

## 2017-02-06 NOTE — Anesthesia Preprocedure Evaluation (Addendum)
Anesthesia Evaluation  Patient identified by MRN, date of birth, ID band Patient awake    Reviewed: Allergy & Precautions, NPO status , Patient's Chart, lab work & pertinent test results  History of Anesthesia Complications Negative for: history of anesthetic complications  Airway Mallampati: I  TM Distance: >3 FB Neck ROM: Full    Dental  (+) Dental Advisory Given   Pulmonary neg pulmonary ROS,    breath sounds clear to auscultation       Cardiovascular negative cardio ROS   Rhythm:Regular Rate:Normal     Neuro/Psych negative neurological ROS     GI/Hepatic negative GI ROS, Neg liver ROS,   Endo/Other  Morbid obesity  Renal/GU Renal mass     Musculoskeletal   Abdominal (+) + obese,   Peds  Hematology negative hematology ROS (+)   Anesthesia Other Findings   Reproductive/Obstetrics                            Anesthesia Physical Anesthesia Plan  ASA: II  Anesthesia Plan: General   Post-op Pain Management:    Induction: Intravenous  PONV Risk Score and Plan: 4 or greater and Scopolamine patch - Pre-op, Dexamethasone and Ondansetron  Airway Management Planned: Oral ETT  Additional Equipment:   Intra-op Plan:   Post-operative Plan: Extubation in OR  Informed Consent: I have reviewed the patients History and Physical, chart, labs and discussed the procedure including the risks, benefits and alternatives for the proposed anesthesia with the patient or authorized representative who has indicated his/her understanding and acceptance.   Dental advisory given  Plan Discussed with: CRNA and Surgeon  Anesthesia Plan Comments: (Plan routine monitors, GETA)        Anesthesia Quick Evaluation

## 2017-02-07 ENCOUNTER — Inpatient Hospital Stay (HOSPITAL_COMMUNITY): Payer: Federal, State, Local not specified - PPO | Admitting: Anesthesiology

## 2017-02-07 ENCOUNTER — Encounter (HOSPITAL_COMMUNITY)
Admission: RE | Disposition: A | Discharge status: Home / Self Care | Payer: Self-pay | Source: Ambulatory Visit | Attending: Urology

## 2017-02-07 ENCOUNTER — Other Ambulatory Visit: Payer: Self-pay

## 2017-02-07 ENCOUNTER — Inpatient Hospital Stay (HOSPITAL_COMMUNITY)
Admission: RE | Admit: 2017-02-07 | Discharge: 2017-02-08 | DRG: 661 | Disposition: A | Discharge status: Home / Self Care | Payer: Federal, State, Local not specified - PPO | Source: Ambulatory Visit | Attending: Urology | Admitting: Urology

## 2017-02-07 ENCOUNTER — Encounter (HOSPITAL_COMMUNITY): Payer: Self-pay | Admitting: *Deleted

## 2017-02-07 DIAGNOSIS — Z6831 Body mass index (BMI) 31.0-31.9, adult: Secondary | ICD-10-CM | POA: Diagnosis not present

## 2017-02-07 DIAGNOSIS — Z01812 Encounter for preprocedural laboratory examination: Secondary | ICD-10-CM

## 2017-02-07 DIAGNOSIS — N200 Calculus of kidney: Principal | ICD-10-CM | POA: Diagnosis present

## 2017-02-07 DIAGNOSIS — N2889 Other specified disorders of kidney and ureter: Secondary | ICD-10-CM | POA: Diagnosis not present

## 2017-02-07 DIAGNOSIS — N23 Unspecified renal colic: Secondary | ICD-10-CM | POA: Diagnosis not present

## 2017-02-07 DIAGNOSIS — Z23 Encounter for immunization: Secondary | ICD-10-CM | POA: Diagnosis not present

## 2017-02-07 DIAGNOSIS — Z0183 Encounter for blood typing: Secondary | ICD-10-CM

## 2017-02-07 DIAGNOSIS — Z01810 Encounter for preprocedural cardiovascular examination: Secondary | ICD-10-CM

## 2017-02-07 DIAGNOSIS — Z01811 Encounter for preprocedural respiratory examination: Secondary | ICD-10-CM | POA: Diagnosis not present

## 2017-02-07 DIAGNOSIS — N261 Atrophy of kidney (terminal): Secondary | ICD-10-CM | POA: Diagnosis not present

## 2017-02-07 HISTORY — PX: ROBOTIC ASSITED PARTIAL NEPHRECTOMY: SHX6087

## 2017-02-07 LAB — TYPE AND SCREEN
ABO/RH(D): B POS
ANTIBODY SCREEN: NEGATIVE

## 2017-02-07 LAB — HEMOGLOBIN AND HEMATOCRIT, BLOOD
HEMATOCRIT: 39.1 % (ref 36.0–46.0)
HEMOGLOBIN: 12.9 g/dL (ref 12.0–15.0)

## 2017-02-07 SURGERY — NEPHRECTOMY, PARTIAL, ROBOT-ASSISTED
Anesthesia: General | Laterality: Right

## 2017-02-07 MED ORDER — LACTATED RINGERS IV SOLN
INTRAVENOUS | Status: DC | PRN
  Administered 2017-02-07 (×2): via INTRAVENOUS

## 2017-02-07 MED ORDER — HYDROMORPHONE HCL 1 MG/ML IJ SOLN
0.5000 mg | INTRAMUSCULAR | Status: DC | PRN
  Administered 2017-02-07 (×3): 1 mg via INTRAVENOUS
  Filled 2017-02-07 (×4): qty 1

## 2017-02-07 MED ORDER — PHENYLEPHRINE HCL 10 MG/ML IJ SOLN: INTRAMUSCULAR | Status: DC | PRN

## 2017-02-07 MED ORDER — SODIUM CHLORIDE 0.9 % IJ SOLN
INTRAMUSCULAR | Status: DC | PRN
  Administered 2017-02-07: 20 mL

## 2017-02-07 MED ORDER — CEFAZOLIN SODIUM-DEXTROSE 2-4 GM/100ML-% IV SOLN
INTRAVENOUS | Status: AC
  Filled 2017-02-07: qty 100

## 2017-02-07 MED ORDER — HYDROMORPHONE HCL 1 MG/ML IJ SOLN
INTRAMUSCULAR | Status: AC
  Administered 2017-02-07: 0.5 mg via INTRAVENOUS
  Filled 2017-02-07: qty 1

## 2017-02-07 MED ORDER — KETAMINE HCL 10 MG/ML IJ SOLN
INTRAMUSCULAR | Status: DC | PRN
  Administered 2017-02-07: 25 mg via INTRAVENOUS
  Administered 2017-02-07: 10 mg via INTRAVENOUS

## 2017-02-07 MED ORDER — PROMETHAZINE HCL 25 MG/ML IJ SOLN
INTRAMUSCULAR | Status: AC
  Administered 2017-02-07: 12.5 mg via INTRAVENOUS
  Filled 2017-02-07: qty 1

## 2017-02-07 MED ORDER — MIDAZOLAM HCL 2 MG/2ML IJ SOLN: 0.5000 mg | Freq: Once | INTRAMUSCULAR | Status: DC | PRN

## 2017-02-07 MED ORDER — MIDAZOLAM HCL 2 MG/2ML IJ SOLN
INTRAMUSCULAR | Status: DC | PRN
  Administered 2017-02-07: 2 mg via INTRAVENOUS

## 2017-02-07 MED ORDER — SCOPOLAMINE 1 MG/3DAYS TD PT72
1.0000 | MEDICATED_PATCH | TRANSDERMAL | Status: DC
  Administered 2017-02-07: 1.5 mg via TRANSDERMAL
  Filled 2017-02-07: qty 1

## 2017-02-07 MED ORDER — DEXTROSE-NACL 5-0.45 % IV SOLN
INTRAVENOUS | Status: DC
  Administered 2017-02-07 – 2017-02-08 (×2): via INTRAVENOUS

## 2017-02-07 MED ORDER — LACTATED RINGERS IV SOLN
INTRAVENOUS | Status: DC | PRN
  Administered 2017-02-07: 08:00:00 via INTRAVENOUS

## 2017-02-07 MED ORDER — ACETAMINOPHEN 500 MG PO TABS
1000.0000 mg | ORAL_TABLET | Freq: Four times a day (QID) | ORAL | Status: AC
  Administered 2017-02-07 – 2017-02-08 (×4): 1000 mg via ORAL
  Filled 2017-02-07 (×4): qty 2

## 2017-02-07 MED ORDER — STERILE WATER FOR IRRIGATION IR SOLN
Status: DC | PRN
  Administered 2017-02-07: 1000 mL

## 2017-02-07 MED ORDER — ONDANSETRON HCL 4 MG/2ML IJ SOLN
4.0000 mg | INTRAMUSCULAR | Status: DC | PRN
  Administered 2017-02-07: 4 mg via INTRAVENOUS
  Filled 2017-02-07: qty 2

## 2017-02-07 MED ORDER — SUGAMMADEX SODIUM 200 MG/2ML IV SOLN
INTRAVENOUS | Status: DC | PRN
  Administered 2017-02-07: 350 mg via INTRAVENOUS

## 2017-02-07 MED ORDER — DEXAMETHASONE SODIUM PHOSPHATE 10 MG/ML IJ SOLN
INTRAMUSCULAR | Status: AC
  Filled 2017-02-07: qty 1

## 2017-02-07 MED ORDER — ROCURONIUM BROMIDE 10 MG/ML (PF) SYRINGE
PREFILLED_SYRINGE | INTRAVENOUS | Status: DC | PRN
  Administered 2017-02-07: 30 mg via INTRAVENOUS
  Administered 2017-02-07: 50 mg via INTRAVENOUS

## 2017-02-07 MED ORDER — SODIUM CHLORIDE 0.9 % IJ SOLN
INTRAMUSCULAR | Status: AC
Start: 2017-02-07 — End: 2017-02-07
  Filled 2017-02-07: qty 50

## 2017-02-07 MED ORDER — OXYCODONE HCL 5 MG PO TABS
5.0000 mg | ORAL_TABLET | ORAL | Status: DC | PRN
  Administered 2017-02-07 – 2017-02-08 (×4): 5 mg via ORAL
  Filled 2017-02-07 (×4): qty 1

## 2017-02-07 MED ORDER — MIDAZOLAM HCL 2 MG/2ML IJ SOLN
INTRAMUSCULAR | Status: AC
Start: 2017-02-07 — End: 2017-02-07
  Filled 2017-02-07: qty 2

## 2017-02-07 MED ORDER — DEXAMETHASONE SODIUM PHOSPHATE 10 MG/ML IJ SOLN
INTRAMUSCULAR | Status: DC | PRN
  Administered 2017-02-07: 10 mg via INTRAVENOUS

## 2017-02-07 MED ORDER — DIPHENHYDRAMINE HCL 50 MG/ML IJ SOLN: 12.5000 mg | Freq: Four times a day (QID) | INTRAMUSCULAR | Status: DC | PRN

## 2017-02-07 MED ORDER — SUFENTANIL CITRATE 50 MCG/ML IV SOLN
INTRAVENOUS | Status: AC
  Filled 2017-02-07: qty 1

## 2017-02-07 MED ORDER — PROPOFOL 10 MG/ML IV BOLUS
INTRAVENOUS | Status: DC | PRN
  Administered 2017-02-07: 150 mg via INTRAVENOUS
  Administered 2017-02-07: 50 mg via INTRAVENOUS

## 2017-02-07 MED ORDER — PHENYLEPHRINE 40 MCG/ML (10ML) SYRINGE FOR IV PUSH (FOR BLOOD PRESSURE SUPPORT)
PREFILLED_SYRINGE | INTRAVENOUS | Status: DC | PRN
  Administered 2017-02-07: 80 ug via INTRAVENOUS
  Administered 2017-02-07: 120 ug via INTRAVENOUS
  Administered 2017-02-07 (×3): 80 ug via INTRAVENOUS

## 2017-02-07 MED ORDER — HYDROCODONE-ACETAMINOPHEN 5-325 MG PO TABS: 1.0000 | ORAL_TABLET | Freq: Four times a day (QID) | ORAL | 0 refills | Status: DC | PRN

## 2017-02-07 MED ORDER — PROPOFOL 10 MG/ML IV BOLUS
INTRAVENOUS | Status: AC
  Filled 2017-02-07: qty 20

## 2017-02-07 MED ORDER — SUGAMMADEX SODIUM 500 MG/5ML IV SOLN
INTRAVENOUS | Status: AC
  Filled 2017-02-07: qty 5

## 2017-02-07 MED ORDER — PHENYLEPHRINE HCL 10 MG/ML IJ SOLN
INTRAMUSCULAR | Status: DC | PRN
  Administered 2017-02-07: 20 ug/min via INTRAVENOUS

## 2017-02-07 MED ORDER — PROMETHAZINE HCL 25 MG/ML IJ SOLN
6.2500 mg | INTRAMUSCULAR | Status: DC | PRN
  Administered 2017-02-07: 12.5 mg via INTRAVENOUS

## 2017-02-07 MED ORDER — LIDOCAINE 2% (20 MG/ML) 5 ML SYRINGE
INTRAMUSCULAR | Status: DC | PRN
  Administered 2017-02-07: 60 mg via INTRAVENOUS

## 2017-02-07 MED ORDER — CEFAZOLIN SODIUM-DEXTROSE 2-3 GM-%(50ML) IV SOLR
INTRAVENOUS | Status: DC | PRN
  Administered 2017-02-07: 2 g via INTRAVENOUS

## 2017-02-07 MED ORDER — MEPERIDINE HCL 50 MG/ML IJ SOLN: 6.2500 mg | INTRAMUSCULAR | Status: DC | PRN

## 2017-02-07 MED ORDER — SUFENTANIL CITRATE 50 MCG/ML IV SOLN
INTRAVENOUS | Status: DC | PRN
  Administered 2017-02-07: 2.5 ug via INTRAVENOUS
  Administered 2017-02-07: 5 ug via INTRAVENOUS
  Administered 2017-02-07: 7.5 ug via INTRAVENOUS
  Administered 2017-02-07: 10 ug via INTRAVENOUS
  Administered 2017-02-07: 5 ug via INTRAVENOUS

## 2017-02-07 MED ORDER — ONDANSETRON HCL 4 MG/2ML IJ SOLN
INTRAMUSCULAR | Status: AC
  Filled 2017-02-07: qty 2

## 2017-02-07 MED ORDER — HYDROMORPHONE HCL 1 MG/ML IJ SOLN
0.2500 mg | INTRAMUSCULAR | Status: DC | PRN
  Administered 2017-02-07 (×2): 0.5 mg via INTRAVENOUS

## 2017-02-07 MED ORDER — INFLUENZA VAC SPLIT QUAD 0.5 ML IM SUSY
0.5000 mL | PREFILLED_SYRINGE | INTRAMUSCULAR | Status: AC
  Administered 2017-02-08: 0.5 mL via INTRAMUSCULAR
  Filled 2017-02-07: qty 0.5

## 2017-02-07 MED ORDER — BUPIVACAINE LIPOSOME 1.3 % IJ SUSP
20.0000 mL | Freq: Once | INTRAMUSCULAR | Status: AC
  Administered 2017-02-07: 20 mL
  Filled 2017-02-07: qty 20

## 2017-02-07 MED ORDER — ONDANSETRON HCL 4 MG/2ML IJ SOLN
INTRAMUSCULAR | Status: DC | PRN
  Administered 2017-02-07: 4 mg via INTRAVENOUS

## 2017-02-07 MED ORDER — DIPHENHYDRAMINE HCL 12.5 MG/5ML PO ELIX
12.5000 mg | ORAL_SOLUTION | Freq: Four times a day (QID) | ORAL | Status: DC | PRN
Start: 2017-02-07 — End: 2017-02-08

## 2017-02-07 MED ORDER — KETAMINE HCL 10 MG/ML IJ SOLN
INTRAMUSCULAR | Status: AC
  Filled 2017-02-07: qty 1

## 2017-02-07 SURGICAL SUPPLY — 70 items
APPLICATOR SURGIFLO ENDO (HEMOSTASIS) ×2 IMPLANT
CHLORAPREP W/TINT 26ML (MISCELLANEOUS) ×2 IMPLANT
CLIP SUT LAPRA TY ABSORB (SUTURE) ×4 IMPLANT
CLIP VESOLOCK LG 6/CT PURPLE (CLIP) ×4 IMPLANT
CLIP VESOLOCK MED LG 6/CT (CLIP) ×8 IMPLANT
CLIP VESOLOCK XL 6/CT (CLIP) IMPLANT
COVER SURGICAL LIGHT HANDLE (MISCELLANEOUS) ×2 IMPLANT
COVER TIP SHEARS 8 DVNC (MISCELLANEOUS) ×1 IMPLANT
COVER TIP SHEARS 8MM DA VINCI (MISCELLANEOUS) ×1
DECANTER SPIKE VIAL GLASS SM (MISCELLANEOUS) ×2 IMPLANT
DERMABOND ADVANCED (GAUZE/BANDAGES/DRESSINGS) ×1
DERMABOND ADVANCED .7 DNX12 (GAUZE/BANDAGES/DRESSINGS) ×1 IMPLANT
DRAIN CHANNEL 15F RND FF 3/16 (WOUND CARE) ×2 IMPLANT
DRAPE ARM DVNC X/XI (DISPOSABLE) ×4 IMPLANT
DRAPE COLUMN DVNC XI (DISPOSABLE) ×1 IMPLANT
DRAPE DA VINCI XI ARM (DISPOSABLE) ×4
DRAPE DA VINCI XI COLUMN (DISPOSABLE) ×1
DRAPE INCISE IOBAN 66X45 STRL (DRAPES) ×2 IMPLANT
DRAPE LAPAROSCOPIC ABDOMINAL (DRAPES) IMPLANT
DRAPE SHEET LG 3/4 BI-LAMINATE (DRAPES) ×2 IMPLANT
DRSG TEGADERM 4X4.75 (GAUZE/BANDAGES/DRESSINGS) ×2 IMPLANT
ELECT PENCIL ROCKER SW 15FT (MISCELLANEOUS) ×2 IMPLANT
ELECT REM PT RETURN 15FT ADLT (MISCELLANEOUS) ×2 IMPLANT
EVACUATOR SILICONE 100CC (DRAIN) ×2 IMPLANT
GAUZE SPONGE 2X2 8PLY STRL LF (GAUZE/BANDAGES/DRESSINGS) ×1 IMPLANT
GLOVE BIO SURGEON STRL SZ 6.5 (GLOVE) ×2 IMPLANT
GLOVE BIOGEL M STRL SZ7.5 (GLOVE) ×4 IMPLANT
GOWN STRL REUS W/TWL LRG LVL3 (GOWN DISPOSABLE) ×4 IMPLANT
HEMOSTAT SURGICEL 4X8 (HEMOSTASIS) ×2 IMPLANT
HOLDER FOLEY CATH W/STRAP (MISCELLANEOUS) ×2 IMPLANT
IRRIG SUCT STRYKERFLOW 2 WTIP (MISCELLANEOUS) ×2
IRRIGATION SUCT STRKRFLW 2 WTP (MISCELLANEOUS) ×1 IMPLANT
KIT BASIN OR (CUSTOM PROCEDURE TRAY) ×2 IMPLANT
LOOP VESSEL MAXI BLUE (MISCELLANEOUS) ×2 IMPLANT
MARKER SKIN DUAL TIP RULER LAB (MISCELLANEOUS) ×2 IMPLANT
NEEDLE INSUFFLATION 14GA 120MM (NEEDLE) ×2 IMPLANT
NS IRRIG 1000ML POUR BTL (IV SOLUTION) ×2 IMPLANT
PORT ACCESS TROCAR AIRSEAL 12 (TROCAR) ×1 IMPLANT
PORT ACCESS TROCAR AIRSEAL 5M (TROCAR) ×1
POSITIONER SURGICAL ARM (MISCELLANEOUS) ×4 IMPLANT
POUCH SPECIMEN RETRIEVAL 10MM (ENDOMECHANICALS) ×2 IMPLANT
RELOAD STAPLER WHITE 60MM (STAPLE) IMPLANT
SEAL CANN UNIV 5-8 DVNC XI (MISCELLANEOUS) ×4 IMPLANT
SEAL XI 5MM-8MM UNIVERSAL (MISCELLANEOUS) ×4
SET TRI-LUMEN FLTR TB AIRSEAL (TUBING) ×2 IMPLANT
SOLUTION ELECTROLUBE (MISCELLANEOUS) ×2 IMPLANT
SPONGE GAUZE 2X2 STER 10/PKG (GAUZE/BANDAGES/DRESSINGS) ×1
SPONGE LAP 4X18 X RAY DECT (DISPOSABLE) ×2 IMPLANT
STAPLE ECHEON FLEX 60 POW ENDO (STAPLE) IMPLANT
STAPLER RELOAD WHITE 60MM (STAPLE)
SURGIFLO W/THROMBIN 8M KIT (HEMOSTASIS) ×2 IMPLANT
SUT ETHILON 3 0 PS 1 (SUTURE) ×2 IMPLANT
SUT MNCRL AB 4-0 PS2 18 (SUTURE) ×4 IMPLANT
SUT PDS AB 1 CT1 27 (SUTURE) ×4 IMPLANT
SUT V-LOC BARB 180 2/0GR6 GS22 (SUTURE)
SUT VIC AB 0 CT1 27 (SUTURE) ×4
SUT VIC AB 0 CT1 27XBRD ANTBC (SUTURE) ×4 IMPLANT
SUT VIC AB 2-0 SH 27 (SUTURE) ×3
SUT VIC AB 2-0 SH 27X BRD (SUTURE) ×3 IMPLANT
SUT VLOC BARB 180 ABS3/0GR12 (SUTURE) ×6
SUTURE V-LC BRB 180 2/0GR6GS22 (SUTURE) IMPLANT
SUTURE VLOC BRB 180 ABS3/0GR12 (SUTURE) ×3 IMPLANT
TAPE STRIPS DRAPE STRL (GAUZE/BANDAGES/DRESSINGS) ×2 IMPLANT
TOWEL OR 17X26 10 PK STRL BLUE (TOWEL DISPOSABLE) ×4 IMPLANT
TOWEL OR NON WOVEN STRL DISP B (DISPOSABLE) ×2 IMPLANT
TRAY FOLEY W/METER SILVER 16FR (SET/KITS/TRAYS/PACK) IMPLANT
TRAY LAPAROSCOPIC (CUSTOM PROCEDURE TRAY) ×2 IMPLANT
TROCAR BLADELESS OPT 5 100 (ENDOMECHANICALS) ×2 IMPLANT
TROCAR XCEL 12X100 BLDLESS (ENDOMECHANICALS) ×2 IMPLANT
WATER STERILE IRR 1000ML POUR (IV SOLUTION) ×4 IMPLANT

## 2017-02-07 NOTE — Anesthesia Procedure Notes (Addendum)
Procedure Name: Intubation Date/Time: 02/07/2017 7:37 AM Performed by: Cynda Familia, CRNA Pre-anesthesia Checklist: Patient identified, Emergency Drugs available, Suction available and Patient being monitored Patient Re-evaluated:Patient Re-evaluated prior to induction Oxygen Delivery Method: Circle system utilized Preoxygenation: Pre-oxygenation with 100% oxygen Induction Type: IV induction Ventilation: Oral airway inserted - appropriate to patient size Laryngoscope Size: Mac and 3 Grade View: Grade I Tube type: Oral Tube size: 7.0 mm Number of attempts: 1 Airway Equipment and Method: Stylet Placement Confirmation: ETT inserted through vocal cords under direct vision,  positive ETCO2 and breath sounds checked- equal and bilateral Secured at: 22 cm Tube secured with: Tape Dental Injury: Teeth and Oropharynx as per pre-operative assessment  Comments: Intubation by SRNA --- supervised and assisted by Glennon Mac

## 2017-02-07 NOTE — H&P (Signed)
Krista [name] is an 31 y.o. female.    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 calyceal diverticulae each with approx 49m stone by CT 11/2016 on eval right flank pain x years that is non-positional. Has h/o febrile pyelo and known cysts for about 12 years. No solid masses. 1 artery (superior to vein) / 1 vein right renovascular anatomy. She is very bothored.   PMH unremarkiable. No piror surgeries, no CV disease. No PCP at present.   Today "Krista [name] is seen to proceed with RIGHT robotic partial nephrectomy. No interval fevers. Most recent UA without infectious parameters.    Past Medical History:  Diagnosis Date  . Anemia    History of  . Cyst of right kidney     Past Surgical History:  Procedure Laterality Date  . DILATION AND EVACUATION N/A 02/21/2016   Procedure: DILATATION AND EVACUATION;  Surgeon: JTruett Mainland DO;  [facility];  Service: Gynecology;  Laterality: N/A;  . IUD REMOVAL  11/2016  . NO PAST SURGERIES      Family History  Problem Relation Age of Onset  . Asthma Sister    Social History:  reports that  has never smoked. she has never used smokeless tobacco. She reports that she drinks alcohol. She reports that she does not use drugs.  Allergies: No Known Allergies  Medications Prior to Admission  Medication Sig Dispense Refill  . aspirin-acetaminophen-caffeine (EXCEDRIN MIGRAINE) 250-250-65 MG tablet Take 1 tablet by mouth every 6 (six) hours as needed for headache.    . LO LOESTRIN FE 1 MG-10 MCG / 10 MCG tablet Take 1 tablet by mouth daily. (Patient not taking: Reported on 01/28/2017) 3 Package 3    Results for orders placed or performed during the hospital encounter of 02/05/17 (from the past 48 hour(s))  Type and screen All Cardiac and thoracic surgeries, spinal fusions, myomectomies, craniotomies, colon & liver resections, total  joint revisions, same day c-section with placenta previa or accreta.     Status: None   Collection Time: 02/05/17  8:42 AM  Result Value Ref Range   ABO/RH(D) B POS    Antibody Screen NEG    Sample Expiration 02/19/2017    Extend sample reason NO TRANSFUSIONS OR PREGNANCY IN THE PAST 3 MONTHS   ABO/Rh     Status: None   Collection Time: 02/05/17  8:42 AM  Result Value Ref Range   ABO/RH(D) B POS   CBC     Status: None   Collection Time: 02/05/17  8:44 AM  Result Value Ref Range   WBC 7.6 4.0 - 10.5 K/uL   RBC 4.03 3.87 - 5.11 MIL/uL   Hemoglobin 13.1 12.0 - 15.0 g/dL   HCT 39.2 36.0 - 46.0 %   MCV 97.3 78.0 - 100.0 fL   MCH 32.5 26.0 - 34.0 pg   MCHC 33.4 30.0 - 36.0 g/dL   RDW 12.7 11.5 - 15.5 %   Platelets 236 150 - 400 K/uL  Basic metabolic panel     Status: None   Collection Time: 02/05/17  8:44 AM  Result Value Ref Range   Sodium 141 135 - 145 mmol/L   Potassium 4.6 3.5 - 5.1 mmol/L   Chloride 107 101 - 111 mmol/L   CO2 28 22 - 32 mmol/L   Glucose, Bld 89 65 - 99 mg/dL  BUN 10 6 - 20 mg/dL   Creatinine, Ser 0.66 0.44 - 1.00 mg/dL   Calcium 9.3 8.9 - 10.3 mg/dL   GFR calc non Af Amer >60 >60 mL/min   GFR calc Af Amer >60 >60 mL/min    Comment: (NOTE) The eGFR has been calculated using the CKD EPI equation. This calculation has not been validated in all clinical situations. eGFR's persistently <60 mL/min signify possible Chronic Kidney Disease.    Anion gap 6 5 - 15  hCG, serum, qualitative     Status: None   Collection Time: 02/05/17  8:44 AM  Result Value Ref Range   Preg, Serum NEGATIVE NEGATIVE    Comment:        THE SENSITIVITY OF THIS METHODOLOGY IS >10 mIU/mL.    No results found.  Review of Systems  Constitutional: Negative.  Negative for chills and fever.  HENT: Negative.   Eyes: Negative.   Respiratory: Negative.   Cardiovascular: Negative.   Gastrointestinal: Negative.   Genitourinary: Negative.   Musculoskeletal: Negative.   Skin:  Negative.   Neurological: Negative.   Endo/Heme/Allergies: Negative.   Psychiatric/Behavioral: Negative.     Blood pressure 110/61, pulse 70, temperature 98.1 F (36.7 C), temperature source Oral, resp. rate 16, height '5\' 4"'  (1.626 m), weight 83 kg (183 lb), last menstrual period 01/24/2017, SpO2 100 %. Physical Exam  Constitutional: She appears well-developed.  Eyes: Pupils are equal, round, and reactive to light.  Neck: Normal range of motion.  Cardiovascular: Normal rate.  GI: Soft.  Mild truncal obesity.   Genitourinary:  Genitourinary Comments: No CVAT at present.   Musculoskeletal: Normal range of motion.  Skin: Skin is warm.  Psychiatric: She has a normal mood and affect.     Assessment/Plan  Proceed as planned with RIGHT robotic partial nephrectomy. Risks, benefits, alternatives, expected peri-op course discussed previously and reiterated today.   Alexis Frock, MD 02/07/2017, 6:12 AM

## 2017-02-07 NOTE — Brief Op Note (Signed)
02/07/2017  9:54 AM  PATIENT:  Addison BaileySharon C Stansbury  31 y.o. female  PRE-OPERATIVE DIAGNOSIS:  RIGHT RENAL Diverticulae WITH PAIN  POST-OPERATIVE DIAGNOSIS:  RIGHT RENAL Diverticula WITH PAIN  PROCEDURE:  Procedure(s): XI ROBOTIC ASSITED PARTIAL NEPHRECTOMY (Right)  SURGEON:  Surgeon(s) and Role:    Sebastian Ache* Amaro Mangold, MD - Primary  PHYSICIAN ASSISTANT:   ASSISTANTS: Harrie ForemanAmanda Dancy   ANESTHESIA:   local and general  EBL:  100 mL   BLOOD ADMINISTERED:none  DRAINS: 1 - JP to bulb, 2 - foley to gravity   LOCAL MEDICATIONS USED:  MARCAINE     SPECIMEN:  Source of Specimen:  superior right renal divertiulum with stone, inferior right renla diverticulum with stone  DISPOSITION OF SPECIMEN:  PATHOLOGY  COUNTS:  YES  TOURNIQUET:  * No tourniquets in log *  DICTATION: .Other Dictation: Dictation Number [id number]  PLAN OF CARE: Admit to inpatient   PATIENT DISPOSITION:  PACU - hemodynamically stable.   Delay start of Pharmacological VTE agent (>24hrs) due to surgical blood loss or risk of bleeding: yes

## 2017-02-07 NOTE — Anesthesia Postprocedure Evaluation (Signed)
Anesthesia Post Note  Patient: Krista BaileySharon C [name]  Procedure(s) Performed: XI ROBOTIC ASSITED PARTIAL NEPHRECTOMY (Right )     Patient location during evaluation: PACU Anesthesia Type: General Level of consciousness: oriented, patient cooperative and sedated Pain management: pain level controlled Vital Signs Assessment: post-procedure vital signs reviewed and stable Respiratory status: spontaneous breathing, nonlabored ventilation, respiratory function stable and patient connected to nasal cannula oxygen Cardiovascular status: blood pressure returned to baseline and stable Postop Assessment: no apparent nausea or vomiting Anesthetic complications: no    Last Vitals:  Vitals:   02/07/17 1115 02/07/17 1135  BP: 121/73 120/62  Pulse: 90 91  Resp: 16 16  Temp: 36.7 C 36.7 C  SpO2: 100% 100%    Last Pain:  Vitals:   02/07/17 1325  TempSrc:   PainSc: 10-Worst pain ever                 Catharine Kettlewell,E. Kasiah Manka

## 2017-02-07 NOTE — Discharge Instructions (Signed)

## 2017-02-07 NOTE — Transfer of Care (Signed)
Immediate Anesthesia Transfer of Care Note  Patient: Krista [name]  Procedure(s) Performed: XI ROBOTIC ASSITED PARTIAL NEPHRECTOMY (Right )  Patient Location: PACU  Anesthesia Type:General  Level of Consciousness: awake and alert   Airway & Oxygen Therapy: Patient Spontanous Breathing and Patient connected to face mask oxygen  Post-op Assessment: Report given to RN and Post -op Vital signs reviewed and stable  Post vital signs: Reviewed and stable  Last Vitals:  Vitals:   02/07/17 0508  BP: 110/61  Pulse: 70  Resp: 16  Temp: 36.7 C  SpO2: 100%    Last Pain:  Vitals:   02/07/17 0508  TempSrc: Oral      Patients Stated Pain Goal: 3 (02/07/17 0521)  Complications: No apparent anesthesia complications

## 2017-02-08 ENCOUNTER — Encounter (HOSPITAL_COMMUNITY): Payer: Self-pay | Admitting: Urology

## 2017-02-08 LAB — HEMOGLOBIN AND HEMATOCRIT, BLOOD
HCT: 34.2 % — ABNORMAL LOW (ref 36.0–46.0)
Hemoglobin: 11.3 g/dL — ABNORMAL LOW (ref 12.0–15.0)

## 2017-02-08 LAB — BASIC METABOLIC PANEL
ANION GAP: 6 (ref 5–15)
BUN: 6 mg/dL (ref 6–20)
CALCIUM: 8.5 mg/dL — AB (ref 8.9–10.3)
CO2: 24 mmol/L (ref 22–32)
Chloride: 106 mmol/L (ref 101–111)
Creatinine, Ser: 0.75 mg/dL (ref 0.44–1.00)
GFR calc Af Amer: >60 mL/min (ref >60)
GFR calc non Af Amer: >60 mL/min (ref >60)
GLUCOSE: 147 mg/dL — AB (ref 65–99)
Potassium: 3.8 mmol/L (ref 3.5–5.1)
Sodium: 136 mmol/L (ref 135–145)

## 2017-02-08 NOTE — Op Note (Signed)
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  PROCEDURES: 1. Robotic-assisted right partial nephrectomy. 2. Intraoperative ultrasound with interpretation.  ASSISTANT:  Harrie Foreman, PA.  ESTIMATED BLOOD LOSS:  100 mL.  COMPLICATION:  None.  SPECIMENS: 1. Right superior renal diverticulum stone. 2. Right inferior renal diverticulum with stone for permanent     pathology.  WARM ISCHEMIA TIME:  19 minutes.  FINDINGS: 1. Early branching artery, single vein right renovascular anatomy. 2. Evidence of scarring locally at the area of inferior and superior     diverticula. 3. Visualization of inferior diverticulum with stone. 4. Visualization of superior diverticulum stone not really visualized     suspect this is within the parenchyma. 5. Superior and inferior renal diverticulum by intraoperative     ultrasound.  DRAINS: 1. Jackson-Pratt drain to bulb suction. 2. Foley catheter to straight drain.  INDICATION:  Krista [name] is a pleasant 31 year old woman with unusual history of recurrent right flank pain and some recurrent urinary tract infections.  She was found on imaging to have what appears to be likely a calyceal diverticulum x2 in the right kidney, one superior, one inferior.  These were both associated with nephrolithiasis and very narrow ostia by imaging.  Options were discussed for management including observation versus trial of endoscopic management with laser ablation of ostia versus more definitive management with a partial nephrectomy.  Given her stone and location as it appears to be essentially parenchymal at the outer  surface of the kidney, these would likely be hazardous to try to manage endoscopically was felt to be the preferred method.  Informed consent was obtained and placed in the medical record.  PROCEDURE IN DETAIL:  The patient is being [name] was verified. Procedure is being right robotic partial nephrectomy was confirmed. Procedure was carried out.  Time-out was performed.  Intravenous antibiotics were administered.  General endotracheal anesthesia was introduced.  The patient was placed into a right side up full flank position, applying 15 degrees of table flexion, superior arm elevator, axillary roll, sequential compression devices, bottom leg bent, top leg straight.  She was further fashioned to the operating table using a 3- inch tape across her supraxiphoid chest and her pelvis.  Beanbag was deployed Foley catheter was placed.  Sterile field was created by prepping and draping the patient's entire right flank using chlorhexidine gluconate and a high-flow, low pressure pneumoperitoneum was obtained using Veress technique in the right lower quadrant having passed the aspiration and the drop test.  Next, an 8 mm robotic camera port was placed in position approximately 1 handbreadth superolateral to the umbilicus.  Laparoscopic examination of the peritoneal cavity revealed no significant adhesions and no visceral injury.  Additional ports were placed as follows:  Subxiphoid 5 mm liver retraction port, right subcostal 8 mm robotic port,  right far lateral 8 mm robotic port, 4 fingerbreadths superomedial to the anterior iliac spine, right paramedian inferior robotic port 4 fingerbreadths superior to pubic ramus, and two 12 mm assistant port sites in the midline, one in the supraumbilical crease and another 2 fingerbreadths above the camera port.  The superior one being an AirSeal type.  Robot was docked and passed through the electronic checks.  There were some minimal loose adhesions  between the inferior edge of the liver and omentum.  These were carefully taken down with cautery scissors, and a self-locking grasper was placed to liver retraction port elevating the liver off the anterior surface of the kidney and colon.  Next, attention was directed at development of retroperitoneum.  Incision was made lateral to the ascending colon from the area of the cecum towards the area of the hepatic flexure.  This was carefully mobilized medially.  The duodenum was encountered and Kocherized medially such that it lie medial to the inferior vena cava.  Lower pole of the kidney was identified, placed on gentle lateral traction.  Dissection proceeded medial to this.  The ureter was encountered, also placed on gentle lateral traction. Dissection proceeded within this triangle towards the area of the renal hilum.  Renal hilum was consisted of an early branching artery and single renal vein.  The arteries were circumferentially mobilized and a single vessel loop was placed across both of them.  Attention was directed at identification of the calyceal diverticulum and dissection then proceeded directly onto the surface of the kidney anterolaterally and it was de-fatted approximately 50% of its circumference from the anteromedial to posteromedial allowing good visualization of the lateral surface.  There was dividing and scarification noted in 2 locations, one just inferior to the hilum and another one superior.  This corresponded likely sites of calyceal diverticulum based on imaging.  Intraoperative ultrasound was then performed using drop-in probe.  This did corroborate stone material at the lateral aspect of the kidney and the 2 sites that were noted grossly.  This did and confirm location of calyceal diverticulum.  These were scored with cautery scissors, Marcaine plain for partial nephrectomy.  Warm ischemia was then achieved using 2 bulldog clamps on the double artery and  block, and partial nephrectomy was performed using cautery scissors keeping what appeared to be a rim of normal parenchyma around the presumed plane of calyceal diverticulum inferiorly and then superiorly, and these were set aside for pathologic analysis.  Each aspect did allow visualization of an ostia that did appear narrow.  First layer renorrhaphy was then performed of the inferior more site using running 3-0 V-Loc suture over- sewing the visible ostia at the level of the urothelium and over-sewing several small venous sinuses.  This resulted in good hemostasis. Similarly, the superior partial nephrectomy area was similarly oversewn. The ostia were visualized and oversewed at the level of the urothelium. After approximately 19 minutes of warm ischemia time, then the bulldog clamps were released.  Hemostasis continued to appear good.  Second layer renorrhaphy was then performed of the inferior most site placing a Surgicel bolster in the partial nephrectomy defect and then 2 parenchymal apposition sutures of 0 Vicryl sandwiched between Hemo-O- Loks and lapper ties.  Similarly, the superior more second layer renorrhaphy was performed in a similar fashion with 2 interrupted 2-0 silks over a bolster.  All sponge and needle counts were correct.  The kidney was quite mobilized and quite floppy on its vascular pedicle following this.  As  such, Gerota fascia was reapproximated using running V-Loc suture on its lateral aspect performing essentially a nephropexy to decrease mobility of the kidney.  Hemostasis continued to be excellent.  Robot was then undocked.  Specimen was placed in EndoCatch bag for later retrieval.  Closed suction drain was brought through the previous lateral most robotic port site near the peritoneal cavity. Drain stitch was applied.  The superior most 12 mm site was closed at the level of the fascia using Carter-Thomason suture passer and 0 Vicryl.  The specimen was  retrieved with the superior and inferior aspects being set aside as separate specimens for permanent pathology and the inferior most 12 mm site also used for specimen retrieval and was closed using figure-of-eight PDS.  All incision sites were infiltrated with dilute lyophilized Marcaine and closed at the level of the skin using subcuticular Monocryl followed by Dermabond.  Procedure was then terminated.  The patient tolerated the procedure well.  There were no immediate periprocedural complications.  The patient was taken to the postanesthesia care unit in stable condition.  Please note, first assistant, Harrie Foremanmanda Dancy, was absolutely crucial for all robotic portions of the procedure today.  She provided invaluable vascular retraction, suctioning, clip application, suture passage without which, this would not be possible.          ______________________________ Sebastian Acheheodore Mickayla Trouten, MD     TM/MEDQ  D:  02/07/2017  T:  02/08/2017  Job:  [id number]

## 2017-02-08 NOTE — Discharge Summary (Signed)
Physician Discharge Summary  Patient ID: Krista BaileySharon C [name] MRN: [id number] DOB/AGE: 33-24-1987 33 y.o.  Admit date: 02/07/2017 Discharge date: 02/08/2017  Admission Diagnoses:  Discharge Diagnoses:  Active Problems:   Diverticulum of renal calyx   Discharged Condition: good  Hospital Course: underwent right robotic PNx for caliceal diverticulum w/ stones. Following day doing well, pain controlled, ambulating.  Consults: None  Significant Diagnostic Studies: none  Treatments: surgery: right robotic PNx for caliceal diverticulum w/ stones.  Discharge Exam: Blood pressure (!) 105/54, pulse 68, temperature 98.2 F (36.8 C), temperature source Oral, resp. rate 20, height 5\' 4"  (1.626 m), weight 83 kg (183 lb), last menstrual period 01/24/2017, SpO2 98 %. General appearance: alert,NAD' NLR Adequate perfusion Incisions c/d/i, JP minimal SS  Disposition: 01-Home or Self Care   Allergies as of 02/08/2017   No Known Allergies     Medication List    STOP taking these medications   aspirin-acetaminophen-caffeine 250-250-65 MG tablet Commonly known as:  EXCEDRIN MIGRAINE     TAKE these medications   HYDROcodone-acetaminophen 5-325 MG tablet Commonly known as:  NORCO Take 1-2 tablets by mouth every 6 (six) hours as needed for moderate pain or severe pain.   LO LOESTRIN FE 1 MG-10 MCG / 10 MCG tablet Generic drug:  Norethindrone-Ethinyl Estradiol-Fe Biphas Take 1 tablet by mouth daily.      Follow-up Information    Sebastian AcheManny, Theodore, MD On 02/24/2017.   Specialty:  Urology Why:  at 8:45 AM for MD visit.  Contact information: [address] [place] [address] [phone]           Signed: Ray Churchugene D Bell, III 02/08/2017, 9:41 AM

## 2017-02-24 DIAGNOSIS — N2889 Other specified disorders of kidney and ureter: Secondary | ICD-10-CM | POA: Diagnosis not present

## 2017-03-27 ENCOUNTER — Emergency Department (HOSPITAL_COMMUNITY)
Admission: EM | Admit: 2017-03-27 | Discharge: 2017-03-27 | Disposition: A | Discharge status: Home / Self Care | Payer: Federal, State, Local not specified - PPO | Attending: Emergency Medicine | Admitting: Emergency Medicine

## 2017-03-27 ENCOUNTER — Other Ambulatory Visit: Payer: Self-pay

## 2017-03-27 ENCOUNTER — Encounter (HOSPITAL_COMMUNITY): Payer: Self-pay | Admitting: Emergency Medicine

## 2017-03-27 DIAGNOSIS — S39012A Strain of muscle, fascia and tendon of lower back, initial encounter: Secondary | ICD-10-CM

## 2017-03-27 DIAGNOSIS — Y929 Unspecified place or not applicable: Secondary | ICD-10-CM | POA: Insufficient documentation

## 2017-03-27 DIAGNOSIS — Y999 Unspecified external cause status: Secondary | ICD-10-CM | POA: Insufficient documentation

## 2017-03-27 DIAGNOSIS — S3992XA Unspecified injury of lower back, initial encounter: Secondary | ICD-10-CM | POA: Diagnosis not present

## 2017-03-27 DIAGNOSIS — Y939 Activity, unspecified: Secondary | ICD-10-CM | POA: Insufficient documentation

## 2017-03-27 MED ORDER — CYCLOBENZAPRINE HCL 10 MG PO TABS: 10.0000 mg | ORAL_TABLET | Freq: Two times a day (BID) | ORAL | 0 refills | Status: DC | PRN

## 2017-03-27 NOTE — ED Notes (Signed)
PA at bedside.
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
## 2017-03-27 NOTE — Discharge Instructions (Signed)
Please read attached information regarding your condition. Take Flexeril and Tylenol as directed for the next 2-3 days to help with symptoms. Apply heat and stretch area as tolerated.  Massaging the area may also help. Follow-up with your PCP for further evaluation if symptoms persist. Return to ED for worsening symptoms, severe chest or abdominal pain, vomiting, vision changes, numbness in legs.

## 2017-04-08 ENCOUNTER — Ambulatory Visit (HOSPITAL_COMMUNITY)
Admission: EM | Admit: 2017-04-08 | Discharge: 2017-04-08 | Disposition: A | Discharge status: Home / Self Care | Payer: Federal, State, Local not specified - PPO | Attending: Family Medicine | Admitting: Family Medicine

## 2017-04-08 ENCOUNTER — Other Ambulatory Visit: Payer: Self-pay

## 2017-04-08 ENCOUNTER — Encounter (HOSPITAL_COMMUNITY): Payer: Self-pay | Admitting: Emergency Medicine

## 2017-04-08 DIAGNOSIS — Z3202 Encounter for pregnancy test, result negative: Secondary | ICD-10-CM

## 2017-04-08 DIAGNOSIS — H6993 Unspecified Eustachian tube disorder, bilateral: Secondary | ICD-10-CM

## 2017-04-08 DIAGNOSIS — H8110 Benign paroxysmal vertigo, unspecified ear: Secondary | ICD-10-CM | POA: Diagnosis not present

## 2017-04-08 DIAGNOSIS — H6983 Other specified disorders of Eustachian tube, bilateral: Secondary | ICD-10-CM

## 2017-04-08 LAB — POCT PREGNANCY, URINE: Preg Test, Ur: NEGATIVE

## 2017-04-08 MED ORDER — FLUTICASONE PROPIONATE 50 MCG/ACT NA SUSP: 1.0000 | Freq: Every day | NASAL | 1 refills | Status: DC

## 2017-04-08 MED ORDER — ONDANSETRON HCL 4 MG PO TABS: 4.0000 mg | ORAL_TABLET | Freq: Four times a day (QID) | ORAL | 0 refills | Status: DC

## 2017-04-08 MED ORDER — MECLIZINE HCL 25 MG PO TABS: 25.0000 mg | ORAL_TABLET | Freq: Three times a day (TID) | ORAL | 0 refills | Status: DC | PRN

## 2017-04-08 NOTE — Medical Student Note (Signed)
[facility] Provider Student Note For educational purposes for Medical, PA and NP students only and not part of the legal medical record.   CSN: [id number] Arrival date & time: 04/08/17  1005     History   Chief Complaint Chief Complaint  Patient presents with  . Otalgia    bilateral  . Nausea  . Dizziness    HPI Krista [name] is a 32 y.o. female.  HPI  32 year old [place] female presents with bilateral ear pressure, nausea with one episode of emesis yesterday, and episodic "lightheadedness" with positional changes, worse in the morning when she gets up from bed and self resolving after a couple minutes. States ear pressure has resolved currently. Denies recent travel or barotrauma exposure. Denies loss of consciousness, syncope, altered gait, loss of balance, visual changes, tinnitus, altered sensation, headache, ear drainage, cough, palpitations, chest pain, abdominal pain, urinary or vaginal symptoms. She is not on any birth control and has 1 monogamous partner and does not use protection. History of a miscarriage in 01/2016. LMP 03/18/17. Would like a UPT today. Has a PCP and OB/GYN.   Past Medical History:  Diagnosis Date  . Anemia    History of  . Cyst of right kidney     Patient Active Problem List   Diagnosis Date Noted  . 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 DILATION AND EVACUATION N/A 02/21/2016   Procedure: DILATATION AND EVACUATION;  Surgeon: Levie HeritageJacob J Stinson, DO;  Location: [facility];  Service: Gynecology;  Laterality: N/A;  . IUD REMOVAL  11/2016  . NO PAST SURGERIES    . ROBOTIC ASSITED PARTIAL NEPHRECTOMY Right 02/07/2017   Procedure: XI ROBOTIC ASSITED PARTIAL NEPHRECTOMY;  Surgeon: Sebastian AcheManny, Theodore, MD;  Location: [facility];  Service: Urology;  Laterality: Right;    OB History    Gravida Para Term Preterm AB Living   4 2 2   1 2    SAB TAB Ectopic Multiple Live Births     1      2       Home Medications    Prior to Admission medications   Medication Sig Start Date End Date Taking? Authorizing Provider  cyclobenzaprine (FLEXERIL) 10 MG tablet Take 1 tablet (10 mg total) by mouth 2 (two) times daily as needed for muscle spasms. 03/27/17   Khatri, Hina, PA-C  HYDROcodone-acetaminophen (NORCO) 5-325 MG tablet Take 1-2 tablets by mouth every 6 (six) hours as needed for moderate pain or severe pain. 02/07/17   Dancy, Amanda, PA-C  LO LOESTRIN FE 1 MG-10 MCG / 10 MCG tablet Take 1 tablet by mouth daily. Patient not taking: Reported on 01/28/2017 11/21/16   Levie HeritageStinson, Jacob J, DO    Family History Family History  Problem Relation Age of Onset  . Asthma Sister     Social History Social History   Tobacco Use  . Smoking status: Never Smoker  . Smokeless tobacco: Never Used  Substance Use Topics  . Alcohol use: Yes    Comment: occ  . Drug use: No     Allergies   Patient has no known allergies.   Review of Systems Review of Systems  Constitutional: Negative for chills, fatigue, fever and unexpected weight change.  HENT: Positive for ear pain and rhinorrhea. Negative for congestion, hearing loss, sinus pressure, sinus pain, sneezing and tinnitus.   Eyes: Negative for photophobia and visual disturbance.  Respiratory: Negative for  cough, chest tightness, shortness of breath and wheezing.   Cardiovascular: Negative for chest pain and palpitations.  Gastrointestinal: Positive for nausea and vomiting. Negative for abdominal pain and diarrhea.  Genitourinary: Negative for decreased urine volume, dysuria, hematuria, menstrual problem, pelvic pain, vaginal bleeding, vaginal discharge and vaginal pain.  Musculoskeletal: Negative for gait problem.  Neurological: Positive for light-headedness. Negative for weakness, numbness and headaches.     Physical Exam Updated Vital Signs BP 120/63 (BP Location: Right Arm)   Pulse 84   Temp 98.2 F (36.8 C) (Oral)   LMP  03/18/2017 (Exact Date)   SpO2 100%   Physical Exam  Constitutional: She is oriented to person, place, and time. She appears well-developed and well-nourished. No distress.  HENT:  Head: Normocephalic and atraumatic.  Right Ear: Hearing, external ear and ear canal normal.  Left Ear: Hearing, external ear and ear canal normal.  Nose: Nose normal.  Mouth/Throat: Oropharynx is clear and moist. No oropharyngeal exudate.  Mild clear fluid behind bilateral tympanic membranes. Landmarks clearly visualized. No erythema, drainage, or debris in canals.   Eyes: Conjunctivae and EOM are normal. Pupils are equal, round, and reactive to light. Right eye exhibits no discharge. Left eye exhibits no discharge. No scleral icterus.  Slight lateral horizontal nystagmus, one side is not more significant than the other. Dix-Hallpike shows mild nystagmus   Neck: Neck supple.  Cardiovascular: Normal rate and regular rhythm.  No murmur heard. Pulmonary/Chest: Effort normal and breath sounds normal. No respiratory distress.  Abdominal: Soft. There is no tenderness.  Musculoskeletal: She exhibits no edema.  Neurological: She is alert and oriented to person, place, and time.  Finger to nose and rapid alternating movement intact.  Skin: Skin is warm and dry.  Psychiatric: She has a normal mood and affect.  Nursing note and vitals reviewed.    ED Treatments / Results  Labs (all labs ordered are listed, but only abnormal results are displayed) Labs Reviewed - No data to display  EKG  EKG Interpretation None       Radiology No results found.  Procedures Procedures (including critical care time)  Medications Ordered in ED Medications - No data to display   Initial Impression / Assessment and Plan / ED Course  I have reviewed the triage vital signs and the nursing notes.  Pertinent labs & imaging results that were available during my care of the patient were reviewed by me and considered in my  medical decision making (see chart for details).     Benign Paroxysmal Positional Vertigo/Eustachian Tube Disorder Signs and symptoms consistent with paroxysmal vertigo and probable eustachian tube involvement. It is self-resolving within a few minutes and includes no visual or hearing changes. Unlikely from central origin. UPT negative. Prescribe meclizine for dizziness, Zofran for nausea, and Flonase for ETD. Follow up with PCP if symptoms persist, increase in frequency/duration or she develops visual changes. Consider ENT follow up in the future if needed.   Final Clinical Impressions(s) / ED Diagnoses   Final diagnoses:  None    New Prescriptions New Prescriptions   No medications on file

## 2017-04-08 NOTE — ED Triage Notes (Signed)
Pt reports bilateral ear pain, dizziness, and nausea x1 week.  She reports one episode of emesis yesterday.

## 2017-04-09 NOTE — ED Provider Notes (Signed)
[facility]   [id number] 04/08/17 Arrival Time: 1005  ASSESSMENT & PLAN:  1. Benign paroxysmal positional vertigo, unspecified laterality   2. Dysfunction of both eustachian tubes    Meds ordered this encounter  Medications  . meclizine (ANTIVERT) 25 MG tablet    Sig: Take 1 tablet (25 mg total) by mouth 3 (three) times daily as needed for dizziness.    Dispense:  20 tablet    Refill:  0  . fluticasone (FLONASE) 50 MCG/ACT nasal spray    Sig: Place 1 spray into both nostrils daily.    Dispense:  16 g    Refill:  1  . ondansetron (ZOFRAN) 4 MG tablet    Sig: Take 1 tablet (4 mg total) by mouth every 6 (six) hours.    Dispense:  12 tablet    Refill:  0   Signs and symptoms consistent with paroxysmal vertigo and probable eustachian tube involvement. It is self-resolving within a few minutes and includes no visual or hearing changes. Unlikely from central origin. UPT negative. Prescribe meclizine for dizziness, Zofran for nausea, and Flonase for ETD. Follow up with PCP if symptoms persist, increase in frequency/duration or she develops visual changes. Consider ENT follow up in the future if needed.   Reviewed expectations re: course of current medical issues. Questions answered. Outlined signs and symptoms indicating need for more acute intervention. Patient verbalized understanding. After Visit Summary given.   SUBJECTIVE:  Krista [name] is a 32 y.o. female who presents with bilateral ear pressure, nausea with one episode of emesis yesterday, and episodic "lightheadedness" with positional changes, worse in the morning when she gets up from bed and self resolving after a couple minutes. States ear pressure has resolved currently. Denies recent travel or barotrauma exposure. Denies loss of consciousness, syncope, altered gait, loss of balance, visual changes, tinnitus, altered sensation, headache, ear drainage, cough, palpitations, chest pain, abdominal pain, urinary or  vaginal symptoms. She is not on any birth control and has 1 monogamous partner and does not use protection. History of a miscarriage in 01/2016. LMP 03/18/17. Would like a UPT today. Has a PCP and OB/GYN.   ROS: As per HPI.   OBJECTIVE:  Vitals:   04/08/17 1102  BP: 120/63  Pulse: 84  Temp: 98.2 F (36.8 C)  TempSrc: Oral  SpO2: 100%    Constitutional: She is oriented to person, place, and time. She appears well-developed and well-nourished. No distress.  HENT:  Head: Normocephalic and atraumatic.  Right Ear: Hearing, external ear and ear canal normal.  Left Ear: Hearing, external ear and ear canal normal.  Nose: Nose normal.  Mouth/Throat: Oropharynx is clear and moist. No oropharyngeal exudate.  Mild clear fluid behind bilateral tympanic membranes. Landmarks clearly visualized. No erythema, drainage, or debris in canals.   Eyes: Conjunctivae and EOM are normal. Pupils are equal, round, and reactive to light. Right eye exhibits no discharge. Left eye exhibits no discharge. No scleral icterus.  Slight lateral horizontal nystagmus, one side is not more significant than the other. Dix-Hallpike shows mild nystagmus   Neck: Neck supple.  Cardiovascular: Normal rate and regular rhythm.  No murmur heard. Pulmonary/Chest: Effort normal and breath sounds normal. No respiratory distress.  Abdominal: Soft. There is no tenderness.  Musculoskeletal: She exhibits no edema.  Neurological: She is alert and oriented to person, place, and time.  Finger to nose and rapid alternating movement intact.  Skin: Skin is warm and dry.  Psychiatric: She has a normal  mood and affect.   Labs: Results for orders placed or performed during the hospital encounter of 04/08/17  Pregnancy, urine POC  Result Value Ref Range   Preg Test, Ur NEGATIVE NEGATIVE   Labs Reviewed  POCT PREGNANCY, URINE    No Known Allergies  Past Medical History:  Diagnosis Date  . Anemia    History of  . Cyst of right  kidney    Social History   Socioeconomic History  . Marital status: Single    Spouse name: Not on file  . Number of children: Not on file  . Years of education: Not on file  . Highest education level: Not on file  Social Needs  . Financial resource strain: Not on file  . Food insecurity - worry: Not on file  . Food insecurity - inability: Not on file  . Transportation needs - medical: Not on file  . Transportation needs - non-medical: Not on file  Occupational History  . Not on file  Tobacco Use  . Smoking status: Never Smoker  . Smokeless tobacco: Never Used  Substance and Sexual Activity  . Alcohol use: Yes    Comment: occ  . Drug use: No  . Sexual activity: Not Currently    Birth control/protection: None  Other Topics Concern  . Not on file  Social History Narrative  . Not on file   Family History  Problem Relation Age of Onset  . Asthma Sister    Past Surgical History:  Procedure Laterality Date  . DILATION AND EVACUATION N/A 02/21/2016   Procedure: DILATATION AND EVACUATION;  Surgeon: Levie HeritageJacob J Stinson, DO;  Location: [facility];  Service: Gynecology;  Laterality: N/A;  . IUD REMOVAL  11/2016  . NO PAST SURGERIES    . ROBOTIC ASSITED PARTIAL NEPHRECTOMY Right 02/07/2017   Procedure: XI ROBOTIC ASSITED PARTIAL NEPHRECTOMY;  Surgeon: Sebastian AcheManny, Theodore, MD;  Location: [facility];  Service: Urology;  Laterality: Right;      Mardella LaymanHagler, Brantly Kalman, MD 04/09/17 [phone]

## 2017-06-11 ENCOUNTER — Other Ambulatory Visit: Payer: Self-pay

## 2017-06-11 ENCOUNTER — Emergency Department (HOSPITAL_COMMUNITY): Payer: Federal, State, Local not specified - PPO

## 2017-06-11 ENCOUNTER — Emergency Department (HOSPITAL_COMMUNITY)
Admission: EM | Admit: 2017-06-11 | Discharge: 2017-06-11 | Disposition: A | Discharge status: Home / Self Care | Payer: Federal, State, Local not specified - PPO | Attending: Emergency Medicine | Admitting: Emergency Medicine

## 2017-06-11 ENCOUNTER — Encounter (HOSPITAL_COMMUNITY): Payer: Self-pay | Admitting: Emergency Medicine

## 2017-06-11 DIAGNOSIS — R1030 Lower abdominal pain, unspecified: Secondary | ICD-10-CM | POA: Diagnosis not present

## 2017-06-11 DIAGNOSIS — O209 Hemorrhage in early pregnancy, unspecified: Secondary | ICD-10-CM | POA: Diagnosis not present

## 2017-06-11 DIAGNOSIS — O208 Other hemorrhage in early pregnancy: Secondary | ICD-10-CM | POA: Diagnosis not present

## 2017-06-11 DIAGNOSIS — Z3A01 Less than 8 weeks gestation of pregnancy: Secondary | ICD-10-CM | POA: Diagnosis not present

## 2017-06-11 DIAGNOSIS — O4690 Antepartum hemorrhage, unspecified, unspecified trimester: Secondary | ICD-10-CM

## 2017-06-11 LAB — CBC
HEMATOCRIT: 39.3 % (ref 36.0–46.0)
HEMOGLOBIN: 13.2 g/dL (ref 12.0–15.0)
MCH: 32.3 pg (ref 26.0–34.0)
MCHC: 33.6 g/dL (ref 30.0–36.0)
MCV: 96.1 fL (ref 78.0–100.0)
PLATELETS: 247 10*3/uL (ref 150–400)
RBC: 4.09 MIL/uL (ref 3.87–5.11)
RDW: 13.3 % (ref 11.5–15.5)
WBC: 10.1 10*3/uL (ref 4.0–10.5)

## 2017-06-11 LAB — WET PREP, GENITAL
Clue Cells Wet Prep HPF POC: NONE SEEN
Sperm: NONE SEEN
Trich, Wet Prep: NONE SEEN
Yeast Wet Prep HPF POC: NONE SEEN

## 2017-06-11 LAB — I-STAT BETA HCG BLOOD, ED (MC, WL, AP ONLY): HCG, QUANTITATIVE: 44.4 m[IU]/mL — AB (ref <5)

## 2017-06-11 LAB — HCG, QUANTITATIVE, PREGNANCY: hCG, Beta Chain, Quant, S: 53 m[IU]/mL — ABNORMAL HIGH (ref <5)

## 2017-06-11 MED ORDER — PRENATAL COMPLETE 14-0.4 MG PO TABS: 1.0000 | ORAL_TABLET | Freq: Every day | ORAL | 0 refills | Status: DC

## 2017-06-11 NOTE — Discharge Instructions (Addendum)
Please read attached information. If you experience any new or worsening signs or symptoms please return to the emergency room for evaluation. Please follow-up with your primary care provider or specialist as discussed. Please use medication prescribed only as directed and discontinue taking if you have any concerning signs or symptoms.   °

## 2017-06-11 NOTE — ED Provider Notes (Signed)
[facility] Provider Note   CSN: [id number] Arrival date & time: 06/11/17  1130     History   Chief Complaint Chief Complaint  Patient presents with  . Abdominal Cramping    HPI Krista BaileySharon C [name] is a 32 y.o. female.  HPI   32 year old female presents today with complaints of vaginal bleeding.  She notes she is sexually active with her LMP on May 17, 2017.  Patient notes she has been having lower abdominal cramping and bleeding that started 3 days ago.  Patient notes no bleeding presently, denies any significant discharge, denies any upper abdominal pain or fever.  She notes history of miscarriage in the past and has 2 children aged 537 and 6611 that were born via spontaneous vaginal delivery.  Patient notes the bleeding is not enough to soak a pad.   Past Medical History:  Diagnosis Date  . Anemia    History of  . Cyst of right kidney     Patient Active Problem List   Diagnosis Date Noted  . 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 DILATION AND EVACUATION N/A 02/21/2016   Procedure: DILATATION AND EVACUATION;  Surgeon: Levie HeritageJacob J Stinson, DO;  Location: [facility];  Service: Gynecology;  Laterality: N/A;  . IUD REMOVAL  11/2016  . NO PAST SURGERIES    . ROBOTIC ASSITED PARTIAL NEPHRECTOMY Right 02/07/2017   Procedure: XI ROBOTIC ASSITED PARTIAL NEPHRECTOMY;  Surgeon: Sebastian AcheManny, Theodore, MD;  Location: [facility];  Service: Urology;  Laterality: Right;     OB History    Gravida  5   Para  2   Term  2   Preterm      AB  1   Living  2     SAB      TAB  1   Ectopic      Multiple      Live Births  2            Home Medications    Prior to Admission medications   Medication Sig Start Date End Date Taking? Authorizing Provider  fluticasone (FLONASE) 50 MCG/ACT nasal spray Place 1 spray into both nostrils daily. 04/08/17   Mardella LaymanHagler, Brian, MD  meclizine (ANTIVERT) 25 MG  tablet Take 1 tablet (25 mg total) by mouth 3 (three) times daily as needed for dizziness. 04/08/17   Mardella LaymanHagler, Brian, MD  ondansetron (ZOFRAN) 4 MG tablet Take 1 tablet (4 mg total) by mouth every 6 (six) hours. 04/08/17   Mardella LaymanHagler, Brian, MD  Prenatal Vit-Fe Fumarate-FA (PRENATAL COMPLETE) 14-0.4 MG TABS Take 1 tablet by mouth daily. 06/11/17   Eyvonne MechanicHedges, Vaishnav Demartin, PA-C    Family History Family History  Problem Relation Age of Onset  . Asthma Sister     Social History Social History   Tobacco Use  . Smoking status: Never Smoker  . Smokeless tobacco: Never Used  Substance Use Topics  . Alcohol use: Yes    Comment: occ  . Drug use: No     Allergies   Patient has no known allergies.   Review of Systems Review of Systems  All other systems reviewed and are negative.    Physical Exam Updated Vital Signs BP 119/72 (BP Location: Right Arm)   Pulse 86   Temp 98 F (36.7 C) (Oral)   Resp 12   Ht 5\' 4"  (1.626 m)   Wt 83.9 kg (185 lb)  LMP 05/17/2017 (Exact Date)   SpO2 99%   BMI 31.76 kg/m   Physical Exam  Constitutional: She is oriented to person, place, and time. She appears well-developed and well-nourished.  HENT:  Head: Normocephalic and atraumatic.  Eyes: Pupils are equal, round, and reactive to light. Conjunctivae are normal. Right eye exhibits no discharge. Left eye exhibits no discharge. No scleral icterus.  Neck: Normal range of motion. No JVD present. No tracheal deviation present.  Pulmonary/Chest: Effort normal. No stridor.  Abdominal: Soft. She exhibits no distension and no mass. There is no tenderness. There is no rebound and no guarding. No hernia.  Genitourinary:  Genitourinary Comments: No vaginal discharge, cervix closed with no bleeding, no blood in the vaginal vault no cervical motion tenderness  Neurological: She is alert and oriented to person, place, and time. Coordination normal.  Psychiatric: She has a normal mood and affect. Her behavior is normal.  Judgment and thought content normal.  Nursing note and vitals reviewed.    ED Treatments / Results  Labs (all labs ordered are listed, but only abnormal results are displayed) Labs Reviewed  HCG, QUANTITATIVE, PREGNANCY - Abnormal; Notable for the following components:      Result Value   hCG, Beta Chain, Quant, S 53 (*)    All other components within normal limits  I-STAT BETA HCG BLOOD, ED ([facility], [facility], [facility] ONLY) - Abnormal; Notable for the following components:   I-stat hCG, quantitative 44.4 (*)    All other components within normal limits  WET PREP, GENITAL  CBC  LIPASE, BLOOD  COMPREHENSIVE METABOLIC PANEL  LIPASE, BLOOD  GC/CHLAMYDIA PROBE AMP ([place]) NOT AT [facility]    EKG None  Radiology US Ob Comp < 14 Wks  Result Date: 06/11/2017 CLINICAL DATA:  32 year old female with vaginal bleeding and cramping. Positive pregnancy test. Quantitative beta HCG 44 and estimated gestational age by LMP 3 weeks and 4 days. EXAM: OBSTETRIC <14 WK [place] AND TRANSVAGINAL OB US TECHNIQUE: Both transabdominal and transvaginal ultrasound examinations were performed for complete evaluation of the gestation as well as the maternal uterus, adnexal regions, and pelvic cul-de-sac. Transvaginal technique was performed to assess early pregnancy. COMPARISON:  CT Abdomen and Pelvis 11/26/2016. FINDINGS: Intrauterine gestational sac: Questionable tiny, early IUP on image 77. Yolk sac:  None Embryo:  None Cardiac Activity: Not applicable Subchorionic hemorrhage:  None visualized. Maternal uterus/adnexae: Aside from the possible tiny IUP there is homogeneous thickening of the endometrium up to 16 millimeters. There is a small volume of fluid in the cul-de-sac with a few internal echoes. The right ovary appears normal measuring 2.6 x 2.0 x 2.4 centimeters. The left ovary is larger measuring 4.8 x 2.7 x 2.4 centimeters and contains a 15 millimeter hypoechoic area which does not demonstrate surrounding hypervascularity  (image 76). IMPRESSION: 1. Possible tiny IUP, but at this point the differential considerations include early IUP, failed IUP, and occult ectopic pregnancy. Recommend serial quantitative beta HCG and follow-up Pelvis Ultrasound. 2. Small volume of pelvic free fluid which may be mildly hemorrhagic. Significance doubtful. 3. No suspicious ovarian mass or finding at this time. Electronically Signed   By: Odessa Fleming M.D.   On: 06/11/2017 15:36   US Ob Transvaginal  Result Date: 06/11/2017 CLINICAL DATA:  32 year old female with vaginal bleeding and cramping. Positive pregnancy test. Quantitative beta HCG 44 and estimated gestational age by LMP 3 weeks and 4 days. EXAM: OBSTETRIC <14 WK [place] AND TRANSVAGINAL OB US TECHNIQUE: Both transabdominal and transvaginal  ultrasound examinations were performed for complete evaluation of the gestation as well as the maternal uterus, adnexal regions, and pelvic cul-de-sac. Transvaginal technique was performed to assess early pregnancy. COMPARISON:  CT Abdomen and Pelvis 11/26/2016. FINDINGS: Intrauterine gestational sac: Questionable tiny, early IUP on image 69. Yolk sac:  None Embryo:  None Cardiac Activity: Not applicable Subchorionic hemorrhage:  None visualized. Maternal uterus/adnexae: Aside from the possible tiny IUP there is homogeneous thickening of the endometrium up to 16 millimeters. There is a small volume of fluid in the cul-de-sac with a few internal echoes. The right ovary appears normal measuring 2.6 x 2.0 x 2.4 centimeters. The left ovary is larger measuring 4.8 x 2.7 x 2.4 centimeters and contains a 15 millimeter hypoechoic area which does not demonstrate surrounding hypervascularity (image 76). IMPRESSION: 1. Possible tiny IUP, but at this point the differential considerations include early IUP, failed IUP, and occult ectopic pregnancy. Recommend serial quantitative beta HCG and follow-up Pelvis Ultrasound. 2. Small volume of pelvic free fluid which may be mildly  hemorrhagic. Significance doubtful. 3. No suspicious ovarian mass or finding at this time. Electronically Signed   By: Odessa Fleming M.D.   On: 06/11/2017 15:36    Procedures Procedures (including critical care time)  Medications Ordered in ED Medications - No data to display   Initial Impression / Assessment and Plan / ED Course  I have reviewed the triage vital signs and the nursing notes.  Pertinent labs & imaging results that were available during my care of the patient were reviewed by me and considered in my medical decision making (see chart for details).     Final Clinical Impressions(s) / ED Diagnoses   Final diagnoses:  Vaginal bleeding during pregnancy    Labs: CBC, hCG,  Imaging: Ultrasound OB complete less than 14 weeks  Consults:  Therapeutics:  Discharge Meds:   Assessment/Plan: 32 year old female presents today with abdominal cramping and bleeding.  On my exam patient has no blood in her vaginal vault, has a closed cervical eyes.  She has slightly elevated hCG at 53.  Her ultrasound shows no identifiable intrauterine pregnancy, no other acute abnormalities.  Patient is well-appearing in no acute distress.  She will follow-up as an outpatient with her OB/GYN for serial hCG and repeat ultrasound.  She is instructed to return immediately with any new or worsening signs or symptoms.  Patient verbalized understanding and agreement to today's plan had no further questions concerns at the time of discharge.      ED Discharge Orders        Ordered    Prenatal Vit-Fe Fumarate-FA (PRENATAL COMPLETE) 14-0.4 MG TABS  Daily     06/11/17 1605       Eyvonne Mechanic, PA-C 06/11/17 1605    Donnetta Hutching, MD 06/12/17 1718

## 2017-06-11 NOTE — ED Triage Notes (Signed)
Pt arrives via POV from home, states positive home pregnancy test 2 days ago. LMP May 17, 2017. Pt states is having severe cramping and bleeding currently. Pt alert, oriented x4, NAD at present. VSS.

## 2017-06-12 LAB — GC/CHLAMYDIA PROBE AMP (~~LOC~~) NOT AT ARMC
Chlamydia: NEGATIVE
Neisseria Gonorrhea: NEGATIVE

## 2017-06-24 ENCOUNTER — Inpatient Hospital Stay (HOSPITAL_COMMUNITY): Payer: Federal, State, Local not specified - PPO

## 2017-06-24 ENCOUNTER — Inpatient Hospital Stay (HOSPITAL_COMMUNITY)
Admission: AD | Admit: 2017-06-24 | Discharge: 2017-06-24 | Disposition: A | Discharge status: Home / Self Care | Payer: Federal, State, Local not specified - PPO | Source: Ambulatory Visit | Attending: Obstetrics and Gynecology | Admitting: Obstetrics and Gynecology

## 2017-06-24 ENCOUNTER — Encounter (HOSPITAL_COMMUNITY): Payer: Self-pay | Admitting: *Deleted

## 2017-06-24 DIAGNOSIS — R109 Unspecified abdominal pain: Secondary | ICD-10-CM | POA: Insufficient documentation

## 2017-06-24 DIAGNOSIS — Z331 Pregnant state, incidental: Secondary | ICD-10-CM

## 2017-06-24 DIAGNOSIS — Z3A01 Less than 8 weeks gestation of pregnancy: Secondary | ICD-10-CM | POA: Insufficient documentation

## 2017-06-24 DIAGNOSIS — O26891 Other specified pregnancy related conditions, first trimester: Secondary | ICD-10-CM

## 2017-06-24 DIAGNOSIS — O209 Hemorrhage in early pregnancy, unspecified: Secondary | ICD-10-CM | POA: Diagnosis not present

## 2017-06-24 DIAGNOSIS — Z3490 Encounter for supervision of normal pregnancy, unspecified, unspecified trimester: Secondary | ICD-10-CM

## 2017-06-24 DIAGNOSIS — R102 Pelvic and perineal pain: Secondary | ICD-10-CM | POA: Diagnosis not present

## 2017-06-24 LAB — CBC WITH DIFFERENTIAL/PLATELET
BASOS PCT: 0 %
Basophils Absolute: 0 10*3/uL (ref 0.0–0.1)
EOS PCT: 2 %
Eosinophils Absolute: 0.2 10*3/uL (ref 0.0–0.7)
HEMATOCRIT: 37.7 % (ref 36.0–46.0)
Hemoglobin: 12.6 g/dL (ref 12.0–15.0)
Lymphocytes Relative: 30 %
Lymphs Abs: 2.5 10*3/uL (ref 0.7–4.0)
MCH: 31.8 pg (ref 26.0–34.0)
MCHC: 33.4 g/dL (ref 30.0–36.0)
MCV: 95.2 fL (ref 78.0–100.0)
MONO ABS: 0.4 10*3/uL (ref 0.1–1.0)
Monocytes Relative: 5 %
NEUTROS ABS: 5.3 10*3/uL (ref 1.7–7.7)
Neutrophils Relative %: 63 %
PLATELETS: 241 10*3/uL (ref 150–400)
RBC: 3.96 MIL/uL (ref 3.87–5.11)
RDW: 13.3 % (ref 11.5–15.5)
WBC: 8.4 10*3/uL (ref 4.0–10.5)

## 2017-06-24 LAB — URINALYSIS, ROUTINE W REFLEX MICROSCOPIC
BILIRUBIN URINE: NEGATIVE
GLUCOSE, UA: NEGATIVE mg/dL
HGB URINE DIPSTICK: NEGATIVE
KETONES UR: 20 mg/dL — AB
NITRITE: NEGATIVE
PROTEIN: NEGATIVE mg/dL
Specific Gravity, Urine: 1.013 (ref 1.005–1.030)
pH: 6 (ref 5.0–8.0)

## 2017-06-24 LAB — HCG, QUANTITATIVE, PREGNANCY: HCG, BETA CHAIN, QUANT, S: 6301 m[IU]/mL — AB (ref <5)

## 2017-06-24 MED ORDER — POLYETHYLENE GLYCOL 3350 17 GM/SCOOP PO POWD: 17.0000 g | Freq: Every day | ORAL | 2 refills | Status: DC | PRN

## 2017-06-24 NOTE — MAU Provider Note (Signed)
History    First Provider Initiated Contact with Patient 06/24/17 [phone]      Chief Complaint:  Abdominal Pain   Krista [name] is  32 y.o. [id number] Patient's last menstrual period was 05/17/2017 (exact date).. Patient is here for follow up of quantitative HCG and ongoing surveillance of pregnancy status.   She is [redacted]w[redacted]d weeks gestation  by LMP, date of conception.    Since her last visit, the patient is with new complaint.     ROS Abdominal Pain: Pulling sensation in pelvic Vaginal bleeding: none now.   Passage of clots or tissue: None Dizziness: Mild  B POS  Her previous Quantitative HCG values are:  Results for Krista, [name] (MRN [id number]) as of 06/24/2017 11:07  Ref. Range 06/11/2017 13:18  HCG, Beta Chain, Quant, S Latest Ref Range: <5 mIU/mL 53 (H)   Seen in ED 4/17. Nml Wet prep and cultures  Physical Exam   Patient Vitals for the past 24 hrs:  BP Temp Temp src Pulse Resp SpO2 Height Weight  06/24/17 0815 109/61 98.1 F (36.7 C) Oral 60 15 100 %  (1.626 m) 188 lb (85.3 kg)   Constitutional: Well-nourished female in no apparent distress. No pallor Neuro: Alert and oriented 4 Cardiovascular: Normal rate Respiratory: Normal effort and rate Abdomen: Soft, nontender Gynecological Exam: examination not indicated  Labs: Results for orders placed or performed during the hospital encounter of 06/24/17 (from the past 24 hour(s))  Urinalysis, Routine w reflex microscopic   Collection Time: 06/24/17  8:17 AM  Result Value Ref Range   Color, Urine YELLOW YELLOW   APPearance HAZY (A) CLEAR   Specific Gravity, Urine 1.013 1.005 - 1.030   pH 6.0 5.0 - 8.0   Glucose, UA NEGATIVE NEGATIVE mg/dL   Hgb urine dipstick NEGATIVE NEGATIVE   Bilirubin Urine NEGATIVE NEGATIVE   Ketones, ur 20 (A) NEGATIVE mg/dL   Protein, ur NEGATIVE NEGATIVE mg/dL   Nitrite NEGATIVE NEGATIVE   Leukocytes, UA SMALL (A) NEGATIVE   RBC / HPF 0-5 0 - 5 RBC/hpf   WBC, UA 0-5 0 - 5 WBC/hpf    Bacteria, UA RARE (A) NONE SEEN   Squamous Epithelial / LPF 0-5 0 - 5   Mucus PRESENT   hCG, quantitative, pregnancy   Collection Time: 06/24/17  9:21 AM  Result Value Ref Range   hCG, Beta Chain, Quant, S 6,301 (H) <5 mIU/mL  CBC with Differential/Platelet   Collection Time: 06/24/17  9:28 AM  Result Value Ref Range   WBC 8.4 4.0 - 10.5 K/uL   RBC 3.96 3.87 - 5.11 MIL/uL   Hemoglobin 12.6 12.0 - 15.0 g/dL   HCT 62.9 52.8 - 41.3 %   MCV 95.2 78.0 - 100.0 fL   MCH 31.8 26.0 - 34.0 pg   MCHC 33.4 30.0 - 36.0 g/dL   RDW 24.4 01.0 - 27.2 %   Platelets 241 150 - 400 K/uL   Neutrophils Relative % 63 %   Neutro Abs 5.3 1.7 - 7.7 K/uL   Lymphocytes Relative 30 %   Lymphs Abs 2.5 0.7 - 4.0 K/uL   Monocytes Relative 5 %   Monocytes Absolute 0.4 0.1 - 1.0 K/uL   Eosinophils Relative 2 %   Eosinophils Absolute 0.2 0.0 - 0.7 K/uL   Basophils Relative 0 %   Basophils Absolute 0.0 0.0 - 0.1 K/uL    Ultrasound Studies:   US Ob Comp < 14 Wks  Result Date: 06/11/2017 CLINICAL DATA:  32 year old female with vaginal bleeding and cramping. Positive pregnancy test. Quantitative beta HCG 44 and estimated gestational age by LMP 3 weeks and 4 days. EXAM: OBSTETRIC <14 WK [place] AND TRANSVAGINAL OB US TECHNIQUE: Both transabdominal and transvaginal ultrasound examinations were performed for complete evaluation of the gestation as well as the maternal uterus, adnexal regions, and pelvic cul-de-sac. Transvaginal technique was performed to assess early pregnancy. COMPARISON:  CT Abdomen and Pelvis 11/26/2016. FINDINGS: Intrauterine gestational sac: Questionable tiny, early IUP on image 58. Yolk sac:  None Embryo:  None Cardiac Activity: Not applicable Subchorionic hemorrhage:  None visualized. Maternal uterus/adnexae: Aside from the possible tiny IUP there is homogeneous thickening of the endometrium up to 16 millimeters. There is a small volume of fluid in the cul-de-sac with a few internal echoes. The right ovary  appears normal measuring 2.6 x 2.0 x 2.4 centimeters. The left ovary is larger measuring 4.8 x 2.7 x 2.4 centimeters and contains a 15 millimeter hypoechoic area which does not demonstrate surrounding hypervascularity (image 76). IMPRESSION: 1. Possible tiny IUP, but at this point the differential considerations include early IUP, failed IUP, and occult ectopic pregnancy. Recommend serial quantitative beta HCG and follow-up Pelvis Ultrasound. 2. Small volume of pelvic free fluid which may be mildly hemorrhagic. Significance doubtful. 3. No suspicious ovarian mass or finding at this time. Electronically Signed   By: Odessa Fleming M.D.   On: 06/11/2017 15:36   US Ob Transvaginal  Result Date: 06/24/2017 CLINICAL DATA:  Pelvic pain EXAM: TRANSVAGINAL OB ULTRASOUND TECHNIQUE: Transvaginal ultrasound was performed for complete evaluation of the gestation as well as the maternal uterus, adnexal regions, and pelvic cul-de-sac. COMPARISON:  None. FINDINGS: Intrauterine gestational sac: Single Yolk sac:  Visualized Embryo:  Not visualized Cardiac Activity: Not visualized Heart Rate:  bpm MSD: 7.5 mm   5 w   3 d CRL:     mm    w  d                  [place] EDC: Subchorionic hemorrhage:  None visualized. Maternal uterus/adnexae: No adnexal mass. Small amount of free fluid in the pelvis. Uterus is retroverted. IMPRESSION: Five week 3 day intrauterine pregnancy by mean sac diameter. No fetal pole currently. This could be followed with repeat ultrasound in 2 weeks to ensure expected progression. Small amount of free fluid in the pelvis. Electronically Signed   By: Charlett Nose M.D.   On: 06/24/2017 10:19   US Ob Transvaginal  Result Date: 06/11/2017 CLINICAL DATA:  31 year old female with vaginal bleeding and cramping. Positive pregnancy test. Quantitative beta HCG 44 and estimated gestational age by LMP 3 weeks and 4 days. EXAM: OBSTETRIC <14 WK [place] AND TRANSVAGINAL OB US TECHNIQUE: Both transabdominal and transvaginal ultrasound  examinations were performed for complete evaluation of the gestation as well as the maternal uterus, adnexal regions, and pelvic cul-de-sac. Transvaginal technique was performed to assess early pregnancy. COMPARISON:  CT Abdomen and Pelvis 11/26/2016. FINDINGS: Intrauterine gestational sac: Questionable tiny, early IUP on image 56. Yolk sac:  None Embryo:  None Cardiac Activity: Not applicable Subchorionic hemorrhage:  None visualized. Maternal uterus/adnexae: Aside from the possible tiny IUP there is homogeneous thickening of the endometrium up to 16 millimeters. There is a small volume of fluid in the cul-de-sac with a few internal echoes. The right ovary appears normal measuring 2.6 x 2.0 x 2.4 centimeters. The left ovary is larger measuring 4.8 x 2.7 x 2.4 centimeters and contains a 15 millimeter  hypoechoic area which does not demonstrate surrounding hypervascularity (image 76). IMPRESSION: 1. Possible tiny IUP, but at this point the differential considerations include early IUP, failed IUP, and occult ectopic pregnancy. Recommend serial quantitative beta HCG and follow-up Pelvis Ultrasound. 2. Small volume of pelvic free fluid which may be mildly hemorrhagic. Significance doubtful. 3. No suspicious ovarian mass or finding at this time. Electronically Signed   By: Odessa Fleming M.D.   On: 06/11/2017 15:36    MAU course/MDM: Quantitative hCG, [place] ordered  Pain and bleeding in early pregnancy with normal rise in Quant and hemodynamically stable.  Assessment:  [redacted]w[redacted]d weeks gestation w/ normal rise in [place] and YS seen on [place] confirming IUP  Plan: Discharge home in stable condition. First trimester precautions [place] for viability in 1-2 weeks Follow-up Information    THE [facility] OF [place] ULTRASOUND Follow up.   Specialty:  Radiology Why:  will call to schedule ultrasound Contact information: [address] [id number] mc [place] [address] [phone]       THE  [facility] OF [place] MATERNITY ADMISSIONS Follow up.   Why:  as needed in emergencies Contact information: [address] [id number] mc [place] [address] [phone]         Allergies as of 06/24/2017   No Known Allergies     Medication List    STOP taking these medications   ondansetron 4 MG tablet Commonly known as:  ZOFRAN     TAKE these medications   DHA PO Take 1 tablet by mouth daily.   fluticasone 50 MCG/ACT nasal spray Commonly known as:  FLONASE Place 1 spray into both nostrils daily.   meclizine 25 MG tablet Commonly known as:  ANTIVERT Take 1 tablet (25 mg total) by mouth 3 (three) times daily as needed for dizziness.   ONE-A-DAY WOMENS PRENATAL PO Take 1 tablet by mouth daily.   polyethylene glycol powder powder Commonly known as:  GLYCOLAX/MIRALAX Take 17 g by mouth daily as needed for moderate constipation.        Katrinka Blazing, [place], CNM 06/24/2017, 11:06 AM  2/3

## 2017-06-24 NOTE — Discharge Instructions (Signed)

## 2017-06-24 NOTE — MAU Note (Signed)
Pt reports cramping and pressure since last pm, denies bleeding. Denies dysuria

## 2017-07-08 ENCOUNTER — Ambulatory Visit (INDEPENDENT_AMBULATORY_CARE_PROVIDER_SITE_OTHER): Payer: Federal, State, Local not specified - PPO | Admitting: Family Medicine

## 2017-07-08 ENCOUNTER — Ambulatory Visit (HOSPITAL_COMMUNITY)
Admission: RE | Admit: 2017-07-08 | Discharge: 2017-07-08 | Disposition: A | Discharge status: Home / Self Care | Payer: Federal, State, Local not specified - PPO | Source: Ambulatory Visit | Attending: Advanced Practice Midwife | Admitting: Advanced Practice Midwife

## 2017-07-08 DIAGNOSIS — O34531 Maternal care for retroversion of gravid uterus, first trimester: Secondary | ICD-10-CM | POA: Diagnosis not present

## 2017-07-08 DIAGNOSIS — O2620 Pregnancy care for patient with recurrent pregnancy loss, unspecified trimester: Secondary | ICD-10-CM | POA: Diagnosis not present

## 2017-07-08 DIAGNOSIS — O021 Missed abortion: Secondary | ICD-10-CM

## 2017-07-08 DIAGNOSIS — Z3490 Encounter for supervision of normal pregnancy, unspecified, unspecified trimester: Secondary | ICD-10-CM

## 2017-07-08 DIAGNOSIS — R109 Unspecified abdominal pain: Secondary | ICD-10-CM | POA: Diagnosis not present

## 2017-07-08 DIAGNOSIS — O26891 Other specified pregnancy related conditions, first trimester: Secondary | ICD-10-CM

## 2017-07-08 DIAGNOSIS — Z3A01 Less than 8 weeks gestation of pregnancy: Secondary | ICD-10-CM | POA: Diagnosis not present

## 2017-07-08 DIAGNOSIS — O3680X1 Pregnancy with inconclusive fetal viability, fetus 1: Secondary | ICD-10-CM | POA: Diagnosis not present

## 2017-07-08 DIAGNOSIS — Z331 Pregnant state, incidental: Secondary | ICD-10-CM

## 2017-07-08 MED ORDER — MISOPROSTOL 200 MCG PO TABS: 1000.0000 ug | ORAL_TABLET | Freq: Once | ORAL | 0 refills | Status: DC

## 2017-07-08 NOTE — Patient Instructions (Signed)

## 2017-07-09 ENCOUNTER — Encounter: Payer: Self-pay | Admitting: Family Medicine

## 2017-07-09 ENCOUNTER — Ambulatory Visit: Payer: Federal, State, Local not specified - PPO | Admitting: Psychology

## 2017-07-09 LAB — HEMOGLOBIN A1C
ESTIMATED AVERAGE GLUCOSE: 108 mg/dL
Hgb A1c MFr Bld: 5.4 % (ref 4.8–5.6)

## 2017-07-09 LAB — TSH: TSH: 0.83 u[IU]/mL (ref 0.450–4.500)

## 2017-07-09 NOTE — Progress Notes (Signed)
Ultrasounds Results Note  SUBJECTIVE HPI:  [name] is a 32 y.o. [id number] at [redacted]w[redacted]d by LMP who presents to the [facility] for followup ultrasound results. The patient denies vaginal bleeding. She reports uterine cramping.  Upon review of the patient's records, patient was first seen in MAU on 06/24/17 for abdominal pain.   BHCG on that day was 6301.  Ultrasound showed IUGS with yolk sac. Repeat ultrasound was performed earlier today.   Past Medical History:  Diagnosis Date  . Anemia    History of  . Cyst of right kidney    Past Surgical History:  Procedure Laterality Date  . DILATION AND EVACUATION N/A 02/21/2016   Procedure: DILATATION AND EVACUATION;  Surgeon: Levie Heritage, DO;  Location: [facility];  Service: Gynecology;  Laterality: N/A;  . IUD REMOVAL  11/2016  . NO PAST SURGERIES    . ROBOTIC ASSITED PARTIAL NEPHRECTOMY Right 02/07/2017   Procedure: XI ROBOTIC ASSITED PARTIAL NEPHRECTOMY;  Surgeon: Sebastian Ache, MD;  Location: [facility];  Service: Urology;  Laterality: Right;   I have reviewed patient's Past Medical Hx, Surgical Hx, Family Hx, Social Hx, medications and allergies.   Review of Systems Review of Systems  Constitutional: Negative for fever and chills.  Gastrointestinal: Negative for nausea, vomiting, abdominal pain, diarrhea and constipation.  Genitourinary: Negative for dysuria.  Musculoskeletal: Negative for back pain.  Neurological: Negative for dizziness and weakness.    Physical Exam  LMP 05/17/2017 (Exact Date)   GENERAL: Well-developed, well-nourished female in no acute distress.  HEENT: Normocephalic, atraumatic.   LUNGS: Effort normal ABDOMEN: soft, non-tender HEART: Regular rate  SKIN: Warm, dry and without erythema PSYCH: Normal mood and affect NEURO: Alert and oriented x 4  IMAGING 06/24/17 Intrauterine gestational sac: Single Yolk sac:  Visualized IMPRESSION: Five week 3 day intrauterine pregnancy by mean sac  diameter. No fetal pole currently. This could be followed with repeat ultrasound in 2 weeks to ensure expected progression. Small amount of free fluid in the pelvis.  07/08/17 Intrauterine gestational sac: Single Yolk sac:  Not visualized IMPRESSION: Six week 2 day intrauterine gestational sac without yolk sac or fetal pole currently visualized. Recommend follow-up in 10-14 days to ensure expected progression. No acute maternal findings.  ASSESSMENT 1. History of recurrent abortion, currently pregnant   Appears as non-viable pregnancy Second miscarriage in a row. Options reviewed as well as likely cause of SAB Blood type is B pos  PLAN Discharge home in stable condition Patient elected for Cytotec, given. Usual expected response reviewed as well as risks and benefits. Limited w/u for recurrent AB to include A1C and TSH.  Krista Bores, MD  07/09/2017  3:53 PM

## 2017-07-10 ENCOUNTER — Telehealth: Payer: Self-pay

## 2017-07-10 NOTE — Telephone Encounter (Signed)
Pt called and stated that Krista [name] prescribed her a medication and had a question.  Also the pt wanted to know if she can go back to work.

## 2017-07-15 ENCOUNTER — Ambulatory Visit (INDEPENDENT_AMBULATORY_CARE_PROVIDER_SITE_OTHER): Payer: Federal, State, Local not specified - PPO | Admitting: Psychology

## 2017-07-15 DIAGNOSIS — F331 Major depressive disorder, recurrent, moderate: Secondary | ICD-10-CM | POA: Diagnosis not present

## 2017-07-17 NOTE — Telephone Encounter (Signed)
Returned patient call.  Left message for patient to callback if she still needed to or send mychart message.

## 2017-07-22 ENCOUNTER — Ambulatory Visit (INDEPENDENT_AMBULATORY_CARE_PROVIDER_SITE_OTHER): Payer: Federal, State, Local not specified - PPO | Admitting: Psychology

## 2017-07-22 DIAGNOSIS — F331 Major depressive disorder, recurrent, moderate: Secondary | ICD-10-CM

## 2017-07-23 ENCOUNTER — Ambulatory Visit: Payer: Federal, State, Local not specified - PPO | Admitting: Psychology

## 2017-07-24 DIAGNOSIS — F902 Attention-deficit hyperactivity disorder, combined type: Secondary | ICD-10-CM | POA: Diagnosis not present

## 2017-07-24 DIAGNOSIS — F339 Major depressive disorder, recurrent, unspecified: Secondary | ICD-10-CM | POA: Diagnosis not present

## 2017-07-24 DIAGNOSIS — F1010 Alcohol abuse, uncomplicated: Secondary | ICD-10-CM | POA: Diagnosis not present

## 2017-07-28 DIAGNOSIS — Z3480 Encounter for supervision of other normal pregnancy, unspecified trimester: Secondary | ICD-10-CM | POA: Insufficient documentation

## 2017-07-29 ENCOUNTER — Encounter: Payer: Federal, State, Local not specified - PPO | Admitting: Certified Nurse Midwife

## 2017-07-30 DIAGNOSIS — F900 Attention-deficit hyperactivity disorder, predominantly inattentive type: Secondary | ICD-10-CM | POA: Diagnosis not present

## 2017-07-30 DIAGNOSIS — F3340 Major depressive disorder, recurrent, in remission, unspecified: Secondary | ICD-10-CM | POA: Diagnosis not present

## 2017-07-30 DIAGNOSIS — F1020 Alcohol dependence, uncomplicated: Secondary | ICD-10-CM | POA: Diagnosis not present

## 2017-08-05 ENCOUNTER — Encounter: Payer: Federal, State, Local not specified - PPO | Admitting: Certified Nurse Midwife

## 2017-08-06 ENCOUNTER — Ambulatory Visit: Payer: Self-pay | Admitting: Psychology

## 2017-08-07 ENCOUNTER — Ambulatory Visit (INDEPENDENT_AMBULATORY_CARE_PROVIDER_SITE_OTHER): Payer: Federal, State, Local not specified - PPO | Admitting: Psychology

## 2017-08-07 DIAGNOSIS — F331 Major depressive disorder, recurrent, moderate: Secondary | ICD-10-CM

## 2017-08-12 ENCOUNTER — Ambulatory Visit (INDEPENDENT_AMBULATORY_CARE_PROVIDER_SITE_OTHER): Payer: Federal, State, Local not specified - PPO | Admitting: Certified Nurse Midwife

## 2017-08-12 ENCOUNTER — Ambulatory Visit: Payer: Federal, State, Local not specified - PPO | Admitting: Certified Nurse Midwife

## 2017-08-12 ENCOUNTER — Encounter: Payer: Self-pay | Admitting: Certified Nurse Midwife

## 2017-08-12 DIAGNOSIS — O039 Complete or unspecified spontaneous abortion without complication: Secondary | ICD-10-CM | POA: Diagnosis not present

## 2017-08-12 NOTE — Progress Notes (Signed)
Patient ID: Krista BaileySharon C [name], female   DOB: 03-21-1985, 32 y.o.   MRN: [id number]  Chief Complaint  Patient presents with  . Follow-up    HPI Krista [name] is a 32 y.o. female.  S/P Cytotec for failed pregnancy roughly 4 weeks ago, stopped bleeding roughly 3 weeks ago. Is currently on Wellbutrin for depression.  Is sexually active with condoms.  Desires genetics counseling.  Desires future pregnancy.  Educated to take her PNV. Blood type is B+.  Her last quant 1 month ago was 6301.    HPI  Past Medical History:  Diagnosis Date  . Anemia    History of  . Cyst of right kidney     Past Surgical History:  Procedure Laterality Date  . DILATION AND EVACUATION N/A 02/21/2016   Procedure: DILATATION AND EVACUATION;  Surgeon: Levie HeritageJacob J Stinson, DO;  Location: [facility];  Service: Gynecology;  Laterality: N/A;  . IUD REMOVAL  11/2016  . NO PAST SURGERIES    . ROBOTIC ASSITED PARTIAL NEPHRECTOMY Right 02/07/2017   Procedure: XI ROBOTIC ASSITED PARTIAL NEPHRECTOMY;  Surgeon: Sebastian AcheManny, Theodore, MD;  Location: [facility];  Service: Urology;  Laterality: Right;    Family History  Problem Relation Age of Onset  . Asthma Sister     Social History Social History   Tobacco Use  . Smoking status: Never Smoker  . Smokeless tobacco: Never Used  Substance Use Topics  . Alcohol use: Not Currently  . Drug use: No    No Known Allergies  Current Outpatient Medications  Medication Sig Dispense Refill  . buPROPion (WELLBUTRIN XL) 150 MG 24 hr tablet   0   No current facility-administered medications for this visit.     Review of Systems Review of Systems Constitutional: negative for fatigue and weight loss Respiratory: negative for cough and wheezing Cardiovascular: negative for chest pain, fatigue and palpitations Gastrointestinal: negative for abdominal pain and change in bowel habits Genitourinary:+recent SAB Integument/breast: negative for nipple discharge Musculoskeletal:negative for  myalgias Neurological: negative for gait problems and tremors Behavioral/Psych: negative for abusive relationship, depression Endocrine: negative for temperature intolerance      Blood pressure 115/76, pulse 77, height 5\' 4"  (1.626 m), weight 188 lb 1.6 oz (85.3 kg), last menstrual period 05/17/2017, unknown if currently breastfeeding.  Physical Exam Physical Exam General:   alert  Skin:   no rash or abnormalities  Lungs:   clear to auscultation bilaterally  Heart:   regular rate and rhythm, S1, S2 normal, no murmur, click, rub or gallop  Breasts:   defrred  Abdomen:  normal findings: no organomegaly, soft, non-tender and no hernia  Pelvis: deferred    90% of 20 min visit spent on counseling and coordination of care.   Data Reviewed Previous medical hx, meds, labs  Assessment     1. SAB (spontaneous abortion)     Recent SAB f/u - [facility]B [facility] REFERRAL - Beta hCG quant (ref lab)     Plan    Orders Placed This Encounter  Procedures  . Beta hCG quant (ref lab)  . AMB [facility] GENETICS REFERRAL    Referral Priority:   Routine    Referral Type:   Consultation    Referral Reason:   Specialty Services Required    Number of Visits Requested:   1   Follow up as needed.

## 2017-08-12 NOTE — Patient Instructions (Signed)

## 2017-08-12 NOTE — Progress Notes (Signed)
Pt here to follow up from SAB.

## 2017-08-13 LAB — BETA HCG QUANT (REF LAB): HCG QUANT: 2 m[IU]/mL

## 2017-08-13 NOTE — Progress Notes (Signed)
No exam performed

## 2017-08-14 ENCOUNTER — Ambulatory Visit (INDEPENDENT_AMBULATORY_CARE_PROVIDER_SITE_OTHER): Payer: Federal, State, Local not specified - PPO | Admitting: Psychology

## 2017-08-14 DIAGNOSIS — F331 Major depressive disorder, recurrent, moderate: Secondary | ICD-10-CM | POA: Diagnosis not present

## 2017-08-15 ENCOUNTER — Other Ambulatory Visit: Payer: Self-pay | Admitting: Certified Nurse Midwife

## 2017-08-15 ENCOUNTER — Encounter (HOSPITAL_COMMUNITY): Payer: Self-pay

## 2017-08-19 ENCOUNTER — Inpatient Hospital Stay (HOSPITAL_COMMUNITY): Admission: RE | Admit: 2017-08-19 | Payer: Self-pay | Source: Ambulatory Visit

## 2017-09-01 ENCOUNTER — Ambulatory Visit (INDEPENDENT_AMBULATORY_CARE_PROVIDER_SITE_OTHER): Payer: Federal, State, Local not specified - PPO | Admitting: Psychology

## 2017-09-01 DIAGNOSIS — F331 Major depressive disorder, recurrent, moderate: Secondary | ICD-10-CM | POA: Diagnosis not present

## 2017-09-02 DIAGNOSIS — F3340 Major depressive disorder, recurrent, in remission, unspecified: Secondary | ICD-10-CM | POA: Diagnosis not present

## 2017-09-02 DIAGNOSIS — N2889 Other specified disorders of kidney and ureter: Secondary | ICD-10-CM | POA: Diagnosis not present

## 2017-09-02 DIAGNOSIS — F1020 Alcohol dependence, uncomplicated: Secondary | ICD-10-CM | POA: Diagnosis not present

## 2017-09-02 DIAGNOSIS — F900 Attention-deficit hyperactivity disorder, predominantly inattentive type: Secondary | ICD-10-CM | POA: Diagnosis not present

## 2017-09-09 DIAGNOSIS — N200 Calculus of kidney: Secondary | ICD-10-CM | POA: Diagnosis not present

## 2017-09-16 ENCOUNTER — Ambulatory Visit: Payer: Federal, State, Local not specified - PPO | Admitting: Psychology

## 2017-09-24 ENCOUNTER — Ambulatory Visit (INDEPENDENT_AMBULATORY_CARE_PROVIDER_SITE_OTHER): Payer: Federal, State, Local not specified - PPO | Admitting: Psychology

## 2017-09-24 DIAGNOSIS — F331 Major depressive disorder, recurrent, moderate: Secondary | ICD-10-CM

## 2017-09-30 ENCOUNTER — Ambulatory Visit (INDEPENDENT_AMBULATORY_CARE_PROVIDER_SITE_OTHER): Payer: Federal, State, Local not specified - PPO | Admitting: Psychology

## 2017-09-30 DIAGNOSIS — F331 Major depressive disorder, recurrent, moderate: Secondary | ICD-10-CM | POA: Diagnosis not present

## 2017-10-14 ENCOUNTER — Ambulatory Visit (INDEPENDENT_AMBULATORY_CARE_PROVIDER_SITE_OTHER): Payer: Federal, State, Local not specified - PPO | Admitting: Psychology

## 2017-10-14 DIAGNOSIS — F331 Major depressive disorder, recurrent, moderate: Secondary | ICD-10-CM

## 2017-10-28 ENCOUNTER — Ambulatory Visit (INDEPENDENT_AMBULATORY_CARE_PROVIDER_SITE_OTHER): Payer: Federal, State, Local not specified - PPO | Admitting: Psychology

## 2017-10-28 DIAGNOSIS — F331 Major depressive disorder, recurrent, moderate: Secondary | ICD-10-CM | POA: Diagnosis not present

## 2017-11-04 DIAGNOSIS — F3340 Major depressive disorder, recurrent, in remission, unspecified: Secondary | ICD-10-CM | POA: Diagnosis not present

## 2017-11-04 DIAGNOSIS — F900 Attention-deficit hyperactivity disorder, predominantly inattentive type: Secondary | ICD-10-CM | POA: Diagnosis not present

## 2017-11-04 DIAGNOSIS — F1020 Alcohol dependence, uncomplicated: Secondary | ICD-10-CM | POA: Diagnosis not present

## 2017-11-05 DIAGNOSIS — R194 Change in bowel habit: Secondary | ICD-10-CM | POA: Diagnosis not present

## 2017-11-05 DIAGNOSIS — R1010 Upper abdominal pain, unspecified: Secondary | ICD-10-CM | POA: Diagnosis not present

## 2017-11-05 DIAGNOSIS — R140 Abdominal distension (gaseous): Secondary | ICD-10-CM | POA: Diagnosis not present

## 2017-11-05 DIAGNOSIS — R1011 Right upper quadrant pain: Secondary | ICD-10-CM | POA: Diagnosis not present

## 2017-11-07 ENCOUNTER — Other Ambulatory Visit: Payer: Self-pay | Admitting: Gastroenterology

## 2017-11-07 DIAGNOSIS — R1011 Right upper quadrant pain: Secondary | ICD-10-CM

## 2017-11-17 DIAGNOSIS — R140 Abdominal distension (gaseous): Secondary | ICD-10-CM | POA: Diagnosis not present

## 2017-11-17 DIAGNOSIS — R1010 Upper abdominal pain, unspecified: Secondary | ICD-10-CM | POA: Diagnosis not present

## 2017-11-17 DIAGNOSIS — K2930 Chronic superficial gastritis without bleeding: Secondary | ICD-10-CM | POA: Diagnosis not present

## 2017-11-17 DIAGNOSIS — R1011 Right upper quadrant pain: Secondary | ICD-10-CM | POA: Diagnosis not present

## 2017-11-17 DIAGNOSIS — K2980 Duodenitis without bleeding: Secondary | ICD-10-CM | POA: Diagnosis not present

## 2017-11-19 ENCOUNTER — Ambulatory Visit
Admission: RE | Admit: 2017-11-19 | Discharge: 2017-11-19 | Disposition: A | Discharge status: Home / Self Care | Payer: Federal, State, Local not specified - PPO | Source: Ambulatory Visit | Attending: Gastroenterology | Admitting: Gastroenterology

## 2017-11-19 ENCOUNTER — Ambulatory Visit (INDEPENDENT_AMBULATORY_CARE_PROVIDER_SITE_OTHER): Payer: Federal, State, Local not specified - PPO | Admitting: Psychology

## 2017-11-19 DIAGNOSIS — R1011 Right upper quadrant pain: Secondary | ICD-10-CM | POA: Diagnosis not present

## 2017-11-19 DIAGNOSIS — F331 Major depressive disorder, recurrent, moderate: Secondary | ICD-10-CM

## 2017-11-20 DIAGNOSIS — K2980 Duodenitis without bleeding: Secondary | ICD-10-CM | POA: Diagnosis not present

## 2017-11-20 DIAGNOSIS — K2930 Chronic superficial gastritis without bleeding: Secondary | ICD-10-CM | POA: Diagnosis not present

## 2017-12-03 ENCOUNTER — Ambulatory Visit (INDEPENDENT_AMBULATORY_CARE_PROVIDER_SITE_OTHER): Payer: Federal, State, Local not specified - PPO | Admitting: Psychology

## 2017-12-03 DIAGNOSIS — F331 Major depressive disorder, recurrent, moderate: Secondary | ICD-10-CM | POA: Diagnosis not present

## 2017-12-05 DIAGNOSIS — K824 Cholesterolosis of gallbladder: Secondary | ICD-10-CM | POA: Diagnosis not present

## 2017-12-10 DIAGNOSIS — K824 Cholesterolosis of gallbladder: Secondary | ICD-10-CM | POA: Diagnosis not present

## 2017-12-17 ENCOUNTER — Ambulatory Visit (INDEPENDENT_AMBULATORY_CARE_PROVIDER_SITE_OTHER): Payer: Federal, State, Local not specified - PPO | Admitting: Psychology

## 2017-12-17 DIAGNOSIS — F331 Major depressive disorder, recurrent, moderate: Secondary | ICD-10-CM | POA: Diagnosis not present

## 2018-01-14 DIAGNOSIS — J410 Simple chronic bronchitis: Secondary | ICD-10-CM | POA: Diagnosis not present

## 2018-01-14 DIAGNOSIS — J320 Chronic maxillary sinusitis: Secondary | ICD-10-CM | POA: Diagnosis not present

## 2018-01-14 DIAGNOSIS — J342 Deviated nasal septum: Secondary | ICD-10-CM | POA: Diagnosis not present

## 2018-01-14 DIAGNOSIS — J322 Chronic ethmoidal sinusitis: Secondary | ICD-10-CM | POA: Diagnosis not present

## 2018-01-19 DIAGNOSIS — F900 Attention-deficit hyperactivity disorder, predominantly inattentive type: Secondary | ICD-10-CM | POA: Diagnosis not present

## 2018-01-19 DIAGNOSIS — F1020 Alcohol dependence, uncomplicated: Secondary | ICD-10-CM | POA: Diagnosis not present

## 2018-01-19 DIAGNOSIS — F3340 Major depressive disorder, recurrent, in remission, unspecified: Secondary | ICD-10-CM | POA: Diagnosis not present

## 2018-01-28 ENCOUNTER — Other Ambulatory Visit: Payer: Self-pay | Admitting: Otolaryngology

## 2018-01-28 ENCOUNTER — Ambulatory Visit
Admission: RE | Admit: 2018-01-28 | Discharge: 2018-01-28 | Disposition: A | Discharge status: Home / Self Care | Payer: Federal, State, Local not specified - PPO | Source: Ambulatory Visit | Attending: Otolaryngology | Admitting: Otolaryngology

## 2018-01-28 DIAGNOSIS — J322 Chronic ethmoidal sinusitis: Secondary | ICD-10-CM

## 2018-01-28 DIAGNOSIS — J320 Chronic maxillary sinusitis: Secondary | ICD-10-CM

## 2018-01-28 DIAGNOSIS — R0981 Nasal congestion: Secondary | ICD-10-CM | POA: Diagnosis not present

## 2018-02-04 ENCOUNTER — Ambulatory Visit (INDEPENDENT_AMBULATORY_CARE_PROVIDER_SITE_OTHER): Payer: Federal, State, Local not specified - PPO | Admitting: Psychology

## 2018-02-04 DIAGNOSIS — F331 Major depressive disorder, recurrent, moderate: Secondary | ICD-10-CM | POA: Diagnosis not present

## 2018-03-03 ENCOUNTER — Encounter: Payer: Self-pay | Admitting: Advanced Practice Midwife

## 2018-03-03 ENCOUNTER — Other Ambulatory Visit: Payer: Self-pay

## 2018-03-03 ENCOUNTER — Ambulatory Visit (INDEPENDENT_AMBULATORY_CARE_PROVIDER_SITE_OTHER): Payer: Federal, State, Local not specified - PPO | Admitting: Advanced Practice Midwife

## 2018-03-03 VITALS — BP 130/84 | HR 89 | Ht 64.0 in | Wt 183.9 lb

## 2018-03-03 DIAGNOSIS — Z1151 Encounter for screening for human papillomavirus (HPV): Secondary | ICD-10-CM | POA: Diagnosis not present

## 2018-03-03 DIAGNOSIS — Z01419 Encounter for gynecological examination (general) (routine) without abnormal findings: Secondary | ICD-10-CM | POA: Diagnosis not present

## 2018-03-03 DIAGNOSIS — Z3009 Encounter for other general counseling and advice on contraception: Secondary | ICD-10-CM

## 2018-03-03 DIAGNOSIS — Z113 Encounter for screening for infections with a predominantly sexual mode of transmission: Secondary | ICD-10-CM

## 2018-03-03 DIAGNOSIS — Z124 Encounter for screening for malignant neoplasm of cervix: Secondary | ICD-10-CM

## 2018-03-03 MED ORDER — PRENATAL VITAMINS 0.8 MG PO TABS: 1.0000 | ORAL_TABLET | Freq: Every day | ORAL | 3 refills | Status: AC

## 2018-03-03 NOTE — Progress Notes (Signed)
Subjective:     Krista [name] is a 33 y.o. female here for a routine exam.  She is currently not using any contraception, but states she does not desire pregnancy.  However she is okay if pregnancy were to occur. Krista [name] denies issues during sexual activity and denies issues with menstruation.  She does report some labial irritation, but denies vaginal discharge, itching, burning, or odor.  She states that the irritation started at the end of December, but is intermittent and lasts only a few seconds.  She has not attempted to treat the area with any OTC medications.  Krista [name] does not perform SBE, but endorses a healthy diet and exercise 3x/week.  She is a non-smoker and drinks socially.  She endorses safety at home and has no other q/c.   Gynecologic History Patient's last menstrual period was 02/15/2018. Contraception: none Last Pap: Collected Today. Results were: Pending Last mammogram: N/A.  Obstetric History OB History  Gravida Para Term Preterm AB Living  5 2 2   3 2   SAB TAB Ectopic Multiple Live Births  2 1     2     # Outcome Date GA Lbr Len/2nd Weight Sex Delivery Anes PTL Lv  5 SAB 07/08/17 [redacted]w[redacted]d         4 SAB 02/21/16 [redacted]w[redacted]d         3 Term 07/23/09    M Vag-Spont Other  LIV  2 TAB 2008          1 Term 09/16/05    M Vag-Spont EPI  LIV     The following portions of the patient's history were reviewed and updated as appropriate: allergies, current medications, past family history, past medical history, past social history, past surgical history and problem list.  Review of Systems A comprehensive review of systems was negative except for: Genitourinary: positive for Labial irritation    Objective:    BP 130/84   Pulse 89   Ht 5\' 4"  (1.626 m)   Wt 183 lb 14.4 oz (83.4 kg)   LMP 02/15/2018   BMI 31.57 kg/m   General Appearance:    Alert, cooperative, no distress, appears stated age  Head:    Normocephalic, without obvious abnormality, atraumatic  Eyes:    PERRL,  conjunctiva/corneas clear-bilaterally  Throat:   Lips, mucosa, and tongue normal; teeth and gums normal  Neck:   Supple, symmetrical, trachea midline, no adenopathy;    thyroid:  no enlargement/tenderness/nodules; no carotid   bruit or JVD  Back:     Symmetric, no curvature, ROM normal,   Lungs:     Clear to auscultation bilaterally, respirations unlabored  Chest Wall:    No tenderness or deformity   Heart:    Regular rate and rhythm  Breast Exam:    Symmetrical, Skin Intact, No tenderness, masses, or nipple abnormality.  Abdomen:     Soft, non-tender, bowel sounds active all four quadrants,    no masses, no organomegaly  Genitalia:    -Vulva: Normal female without lesion, discharge or     tenderness.   -Vagina: Pink mucosa, No apparent lesions.  Small amt think   white discharge in vault.   -Cervix: Pink, Parous, No lesions, cysts, or polyps. Pap and    GC/CT Collected.  Rectal:    No apparent hemorrhoids  Extremities:   Extremities normal, atraumatic, no cyanosis or edema  Pulses:   2+ and symmetric all extremities  Skin:   Skin color, texture, turgor normal,  no rashes or lesions  Lymph nodes:   Cervical, supraclavicular, and axillary nodes normal         Assessment:   33 year old Well Woman Exam No Contraception Vaginal Irritation Desires STD Testing   Plan:   -Exam findings discussed -Discussed ASCCP pap guidelines based on age and history *Pap/HPV and GC/CT Pending -Encouraged monthly SBE and increased breast awareness. -Educated on breast exam techniques  -Encouraged continued healthy lifestyle behaviors including proper nutrition and exercise -Discussed initiation of PNV as woman of childbearing age *Rx for PNV-Take One Tablet Daily, Disp 90, RF 3 to pharmacy on file -Encouraged continued monitoring of vaginal irritation, discussed ways to reduce including initiation of hydrocortisone cream if appropriate -Plan to RTC in one year for physical exam or prn for  issues  Cherre Robins MSN, CNM 03/03/2018 10:58 AM

## 2018-03-03 NOTE — Progress Notes (Signed)
Presents for AEX.  She wants PAP/STD.

## 2018-03-03 NOTE — Patient Instructions (Signed)
Preventive Care 18-39 Years, Female Preventive care refers to lifestyle choices and visits with your health care provider that can promote health and wellness. What does preventive care include?   A yearly physical exam. This is also called an annual well check.  Dental exams once or twice a year.  Routine eye exams. Ask your health care provider how often you should have your eyes checked.  Personal lifestyle choices, including: ? Daily care of your teeth and gums. ? Regular physical activity. ? Eating a healthy diet. ? Avoiding tobacco and drug use. ? Limiting alcohol use. ? Practicing safe sex. ? Taking vitamin and mineral supplements as recommended by your health care provider. What happens during an annual well check? The services and screenings done by your health care provider during your annual well check will depend on your age, overall health, lifestyle risk factors, and family history of disease. Counseling Your health care provider may ask you questions about your:  Alcohol use.  Tobacco use.  Drug use.  Emotional well-being.  Home and relationship well-being.  Sexual activity.  Eating habits.  Work and work environment.  Method of birth control.  Menstrual cycle.  Pregnancy history. Screening You may have the following tests or measurements:  Height, weight, and BMI.  Diabetes screening. This is done by checking your blood sugar (glucose) after you have not eaten for a while (fasting).  Blood pressure.  Lipid and cholesterol levels. These may be checked every 5 years starting at age 20.  Skin check.  Hepatitis C blood test.  Hepatitis B blood test.  Sexually transmitted disease (STD) testing.  BRCA-related cancer screening. This may be done if you have a family history of breast, ovarian, tubal, or peritoneal cancers.  Pelvic exam and Pap test. This may be done every 3 years starting at age 21. Starting at age 30, this may be done every 5  years if you have a Pap test in combination with an HPV test. Discuss your test results, treatment options, and if necessary, the need for more tests with your health care provider. Vaccines Your health care provider may recommend certain vaccines, such as:  Influenza vaccine. This is recommended every year.  Tetanus, diphtheria, and acellular pertussis (Tdap, Td) vaccine. You may need a Td booster every 10 years.  Varicella vaccine. You may need this if you have not been vaccinated.  HPV vaccine. If you are 26 or younger, you may need three doses over 6 months.  Measles, mumps, and rubella (MMR) vaccine. You may need at least one dose of MMR. You may also need a second dose.  Pneumococcal 13-valent conjugate (PCV13) vaccine. You may need this if you have certain conditions and were not previously vaccinated.  Pneumococcal polysaccharide (PPSV23) vaccine. You may need one or two doses if you smoke cigarettes or if you have certain conditions.  Meningococcal vaccine. One dose is recommended if you are age 19-21 years and a first-year college student living in a residence hall, or if you have one of several medical conditions. You may also need additional booster doses.  Hepatitis A vaccine. You may need this if you have certain conditions or if you travel or work in places where you may be exposed to hepatitis A.  Hepatitis B vaccine. You may need this if you have certain conditions or if you travel or work in places where you may be exposed to hepatitis B.  Haemophilus influenzae type b (Hib) vaccine. You may need this if you   have certain risk factors. Talk to your health care provider about which screenings and vaccines you need and how often you need them. This information is not intended to replace advice given to you by your health care provider. Make sure you discuss any questions you have with your health care provider. Document Released: 04/09/2001 Document Revised: 09/24/2016  Document Reviewed: 12/13/2014 Elsevier Interactive Patient Education  2019 [employer].

## 2018-03-04 LAB — CERVICOVAGINAL ANCILLARY ONLY
Chlamydia: NEGATIVE
NEISSERIA GONORRHEA: NEGATIVE

## 2018-03-05 LAB — CYTOLOGY - PAP
DIAGNOSIS: NEGATIVE
HPV (WINDOPATH): NOT DETECTED

## 2018-03-09 ENCOUNTER — Encounter: Payer: Self-pay | Admitting: Advanced Practice Midwife

## 2018-03-30 ENCOUNTER — Ambulatory Visit (INDEPENDENT_AMBULATORY_CARE_PROVIDER_SITE_OTHER): Payer: Federal, State, Local not specified - PPO | Admitting: Psychology

## 2018-03-30 DIAGNOSIS — F331 Major depressive disorder, recurrent, moderate: Secondary | ICD-10-CM

## 2018-04-14 DIAGNOSIS — F900 Attention-deficit hyperactivity disorder, predominantly inattentive type: Secondary | ICD-10-CM | POA: Diagnosis not present

## 2018-04-14 DIAGNOSIS — F3340 Major depressive disorder, recurrent, in remission, unspecified: Secondary | ICD-10-CM | POA: Diagnosis not present

## 2018-04-14 DIAGNOSIS — F1020 Alcohol dependence, uncomplicated: Secondary | ICD-10-CM | POA: Diagnosis not present

## 2018-04-15 ENCOUNTER — Ambulatory Visit (INDEPENDENT_AMBULATORY_CARE_PROVIDER_SITE_OTHER): Payer: Federal, State, Local not specified - PPO | Admitting: Psychology

## 2018-04-15 DIAGNOSIS — F331 Major depressive disorder, recurrent, moderate: Secondary | ICD-10-CM

## 2018-04-27 ENCOUNTER — Ambulatory Visit (INDEPENDENT_AMBULATORY_CARE_PROVIDER_SITE_OTHER): Payer: Federal, State, Local not specified - PPO | Admitting: Psychology

## 2018-04-27 DIAGNOSIS — F331 Major depressive disorder, recurrent, moderate: Secondary | ICD-10-CM

## 2018-04-28 ENCOUNTER — Ambulatory Visit: Payer: Federal, State, Local not specified - PPO | Admitting: Psychology

## 2018-05-12 ENCOUNTER — Ambulatory Visit (INDEPENDENT_AMBULATORY_CARE_PROVIDER_SITE_OTHER): Payer: Federal, State, Local not specified - PPO | Admitting: Psychology

## 2018-05-12 DIAGNOSIS — F331 Major depressive disorder, recurrent, moderate: Secondary | ICD-10-CM | POA: Diagnosis not present

## 2018-05-14 DIAGNOSIS — J45909 Unspecified asthma, uncomplicated: Secondary | ICD-10-CM | POA: Diagnosis not present

## 2018-05-19 ENCOUNTER — Ambulatory Visit (INDEPENDENT_AMBULATORY_CARE_PROVIDER_SITE_OTHER): Payer: Federal, State, Local not specified - PPO | Admitting: Psychology

## 2018-05-19 DIAGNOSIS — F4310 Post-traumatic stress disorder, unspecified: Secondary | ICD-10-CM | POA: Diagnosis not present

## 2018-05-20 DIAGNOSIS — J4520 Mild intermittent asthma, uncomplicated: Secondary | ICD-10-CM | POA: Diagnosis not present

## 2018-05-20 DIAGNOSIS — J301 Allergic rhinitis due to pollen: Secondary | ICD-10-CM | POA: Diagnosis not present

## 2018-05-20 DIAGNOSIS — J3081 Allergic rhinitis due to animal (cat) (dog) hair and dander: Secondary | ICD-10-CM | POA: Diagnosis not present

## 2018-05-20 DIAGNOSIS — J3089 Other allergic rhinitis: Secondary | ICD-10-CM | POA: Diagnosis not present

## 2018-05-25 ENCOUNTER — Other Ambulatory Visit: Payer: Self-pay | Admitting: Allergy

## 2018-05-25 ENCOUNTER — Ambulatory Visit
Admission: RE | Admit: 2018-05-25 | Discharge: 2018-05-25 | Disposition: A | Discharge status: Home / Self Care | Payer: Federal, State, Local not specified - PPO | Source: Ambulatory Visit | Attending: Allergy | Admitting: Allergy

## 2018-05-25 DIAGNOSIS — R0602 Shortness of breath: Secondary | ICD-10-CM | POA: Diagnosis not present

## 2018-05-25 DIAGNOSIS — J4520 Mild intermittent asthma, uncomplicated: Secondary | ICD-10-CM

## 2018-05-26 ENCOUNTER — Ambulatory Visit (INDEPENDENT_AMBULATORY_CARE_PROVIDER_SITE_OTHER): Payer: Federal, State, Local not specified - PPO | Admitting: Psychology

## 2018-05-26 DIAGNOSIS — F331 Major depressive disorder, recurrent, moderate: Secondary | ICD-10-CM | POA: Diagnosis not present

## 2018-06-11 ENCOUNTER — Ambulatory Visit (INDEPENDENT_AMBULATORY_CARE_PROVIDER_SITE_OTHER): Payer: Federal, State, Local not specified - PPO | Admitting: Psychology

## 2018-06-11 DIAGNOSIS — F411 Generalized anxiety disorder: Secondary | ICD-10-CM

## 2018-06-18 ENCOUNTER — Ambulatory Visit (INDEPENDENT_AMBULATORY_CARE_PROVIDER_SITE_OTHER): Payer: Federal, State, Local not specified - PPO | Admitting: Psychology

## 2018-06-18 DIAGNOSIS — F4310 Post-traumatic stress disorder, unspecified: Secondary | ICD-10-CM

## 2018-06-23 ENCOUNTER — Ambulatory Visit: Payer: Federal, State, Local not specified - PPO | Admitting: Psychology

## 2018-06-24 ENCOUNTER — Ambulatory Visit (INDEPENDENT_AMBULATORY_CARE_PROVIDER_SITE_OTHER): Payer: Federal, State, Local not specified - PPO | Admitting: Psychology

## 2018-06-24 DIAGNOSIS — F4310 Post-traumatic stress disorder, unspecified: Secondary | ICD-10-CM

## 2018-06-25 ENCOUNTER — Ambulatory Visit (INDEPENDENT_AMBULATORY_CARE_PROVIDER_SITE_OTHER): Payer: Federal, State, Local not specified - PPO | Admitting: Psychology

## 2018-06-25 DIAGNOSIS — F331 Major depressive disorder, recurrent, moderate: Secondary | ICD-10-CM | POA: Diagnosis not present

## 2018-07-02 ENCOUNTER — Ambulatory Visit (INDEPENDENT_AMBULATORY_CARE_PROVIDER_SITE_OTHER): Payer: Federal, State, Local not specified - PPO | Admitting: Psychology

## 2018-07-02 DIAGNOSIS — F4310 Post-traumatic stress disorder, unspecified: Secondary | ICD-10-CM | POA: Diagnosis not present

## 2018-07-07 ENCOUNTER — Ambulatory Visit: Payer: Federal, State, Local not specified - PPO | Admitting: Psychology

## 2018-07-09 DIAGNOSIS — L700 Acne vulgaris: Secondary | ICD-10-CM | POA: Diagnosis not present

## 2018-07-14 ENCOUNTER — Ambulatory Visit (INDEPENDENT_AMBULATORY_CARE_PROVIDER_SITE_OTHER): Payer: Federal, State, Local not specified - PPO | Admitting: Psychology

## 2018-07-14 DIAGNOSIS — F331 Major depressive disorder, recurrent, moderate: Secondary | ICD-10-CM | POA: Diagnosis not present

## 2018-07-16 ENCOUNTER — Ambulatory Visit (INDEPENDENT_AMBULATORY_CARE_PROVIDER_SITE_OTHER): Payer: Federal, State, Local not specified - PPO | Admitting: Psychology

## 2018-07-16 DIAGNOSIS — F4310 Post-traumatic stress disorder, unspecified: Secondary | ICD-10-CM | POA: Diagnosis not present

## 2018-07-23 ENCOUNTER — Ambulatory Visit (INDEPENDENT_AMBULATORY_CARE_PROVIDER_SITE_OTHER): Payer: Federal, State, Local not specified - PPO | Admitting: Psychology

## 2018-07-23 DIAGNOSIS — F4310 Post-traumatic stress disorder, unspecified: Secondary | ICD-10-CM

## 2018-07-29 ENCOUNTER — Ambulatory Visit (INDEPENDENT_AMBULATORY_CARE_PROVIDER_SITE_OTHER): Payer: Federal, State, Local not specified - PPO | Admitting: Psychology

## 2018-07-29 DIAGNOSIS — F4310 Post-traumatic stress disorder, unspecified: Secondary | ICD-10-CM

## 2018-07-29 DIAGNOSIS — F331 Major depressive disorder, recurrent, moderate: Secondary | ICD-10-CM | POA: Diagnosis not present

## 2018-08-05 ENCOUNTER — Ambulatory Visit (INDEPENDENT_AMBULATORY_CARE_PROVIDER_SITE_OTHER): Payer: Federal, State, Local not specified - PPO | Admitting: Psychology

## 2018-08-05 DIAGNOSIS — F4310 Post-traumatic stress disorder, unspecified: Secondary | ICD-10-CM

## 2018-08-10 ENCOUNTER — Ambulatory Visit (INDEPENDENT_AMBULATORY_CARE_PROVIDER_SITE_OTHER): Payer: Federal, State, Local not specified - PPO | Admitting: Psychology

## 2018-08-10 DIAGNOSIS — F4310 Post-traumatic stress disorder, unspecified: Secondary | ICD-10-CM

## 2018-08-13 ENCOUNTER — Ambulatory Visit: Payer: Federal, State, Local not specified - PPO | Admitting: Psychology

## 2018-08-19 ENCOUNTER — Ambulatory Visit (INDEPENDENT_AMBULATORY_CARE_PROVIDER_SITE_OTHER): Payer: Federal, State, Local not specified - PPO | Admitting: Psychology

## 2018-08-19 DIAGNOSIS — F4310 Post-traumatic stress disorder, unspecified: Secondary | ICD-10-CM | POA: Diagnosis not present

## 2018-08-26 ENCOUNTER — Ambulatory Visit: Payer: Federal, State, Local not specified - PPO | Admitting: Psychology

## 2018-08-26 ENCOUNTER — Ambulatory Visit (INDEPENDENT_AMBULATORY_CARE_PROVIDER_SITE_OTHER): Payer: Federal, State, Local not specified - PPO | Admitting: Psychology

## 2018-08-26 DIAGNOSIS — F331 Major depressive disorder, recurrent, moderate: Secondary | ICD-10-CM | POA: Diagnosis not present

## 2018-09-02 ENCOUNTER — Ambulatory Visit (INDEPENDENT_AMBULATORY_CARE_PROVIDER_SITE_OTHER): Payer: Federal, State, Local not specified - PPO | Admitting: Psychology

## 2018-09-02 DIAGNOSIS — F4310 Post-traumatic stress disorder, unspecified: Secondary | ICD-10-CM

## 2018-09-10 ENCOUNTER — Ambulatory Visit (INDEPENDENT_AMBULATORY_CARE_PROVIDER_SITE_OTHER): Payer: Federal, State, Local not specified - PPO | Admitting: Psychology

## 2018-09-10 DIAGNOSIS — F331 Major depressive disorder, recurrent, moderate: Secondary | ICD-10-CM | POA: Diagnosis not present

## 2018-09-24 ENCOUNTER — Ambulatory Visit (INDEPENDENT_AMBULATORY_CARE_PROVIDER_SITE_OTHER): Payer: Federal, State, Local not specified - PPO | Admitting: Psychology

## 2018-09-24 DIAGNOSIS — F331 Major depressive disorder, recurrent, moderate: Secondary | ICD-10-CM

## 2018-10-06 DIAGNOSIS — F900 Attention-deficit hyperactivity disorder, predominantly inattentive type: Secondary | ICD-10-CM | POA: Diagnosis not present

## 2018-10-06 DIAGNOSIS — F3340 Major depressive disorder, recurrent, in remission, unspecified: Secondary | ICD-10-CM | POA: Diagnosis not present

## 2018-10-06 DIAGNOSIS — F1020 Alcohol dependence, uncomplicated: Secondary | ICD-10-CM | POA: Diagnosis not present

## 2018-10-15 ENCOUNTER — Ambulatory Visit (INDEPENDENT_AMBULATORY_CARE_PROVIDER_SITE_OTHER): Payer: Federal, State, Local not specified - PPO | Admitting: Psychology

## 2018-10-15 DIAGNOSIS — F331 Major depressive disorder, recurrent, moderate: Secondary | ICD-10-CM | POA: Diagnosis not present

## 2018-10-25 IMAGING — US US OB COMP LESS 14 WK
1 series · 15 of 28 positions shown · non-contrast
Comparison: None.

CLINICAL DATA: 30-year-old pregnant female with pelvic pain.
Estimated gestational age of 5 weeks 6 days by LMP.

EXAM:
OBSTETRIC <14 WK US AND TRANSVAGINAL OB US
TECHNIQUE: Both transabdominal and transvaginal ultrasound examinations were
performed for complete evaluation of the gestation as well as the
maternal uterus, adnexal regions, and pelvic cul-de-sac.
Transvaginal technique was performed to assess early pregnancy.

[Series 1: us ob comp less 14 wk · 15 of 59 slices shown]
[im 1/59]
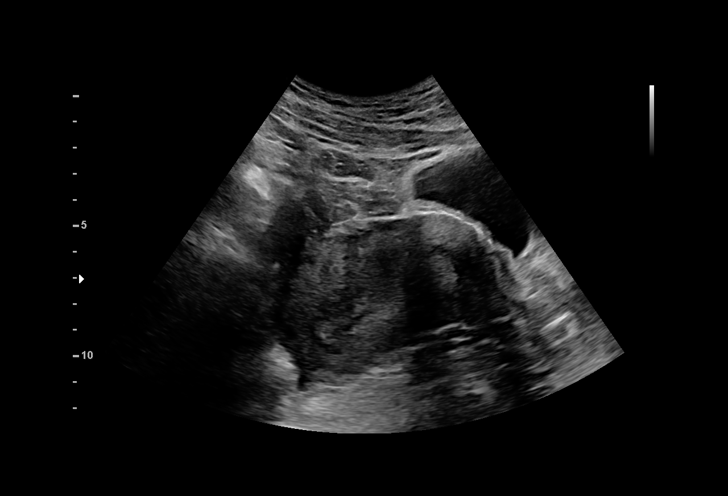
[im 5/59]
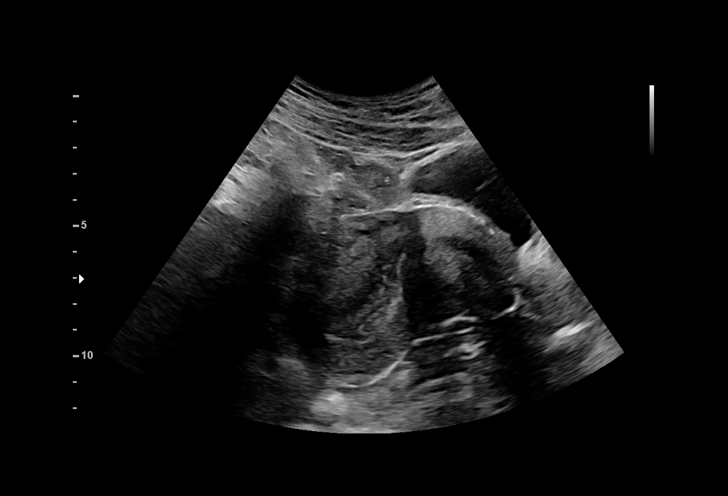
[im 9/59]
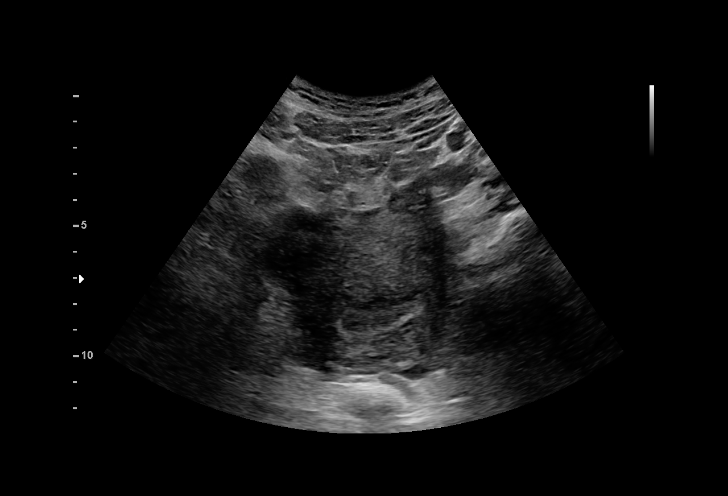
[im 13/59]
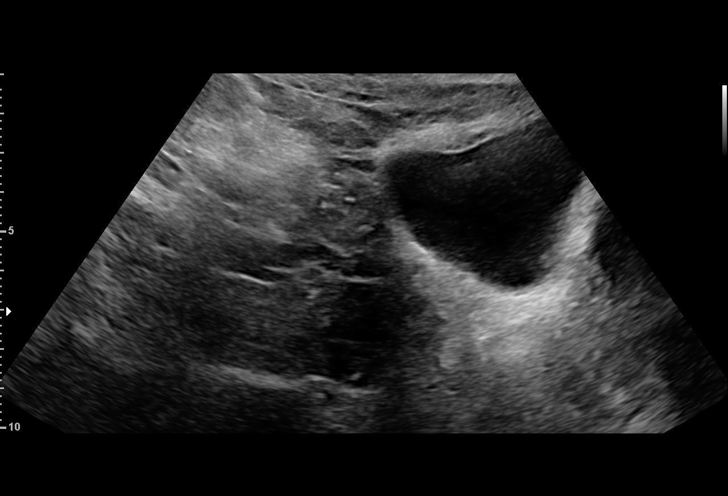
[im 18/59]
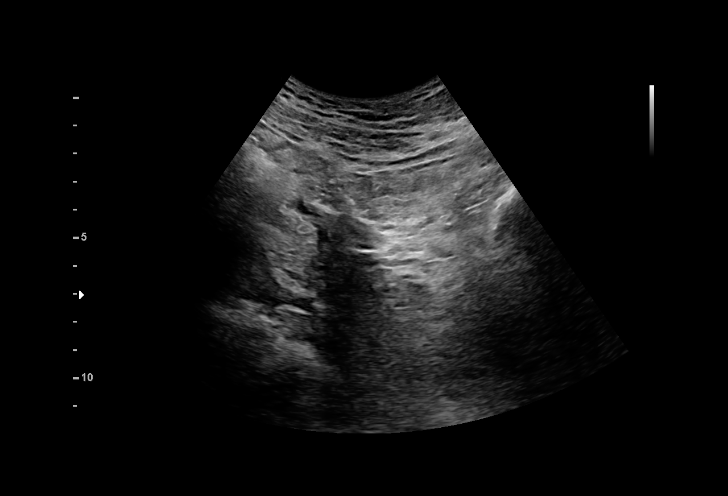
[im 22/59]
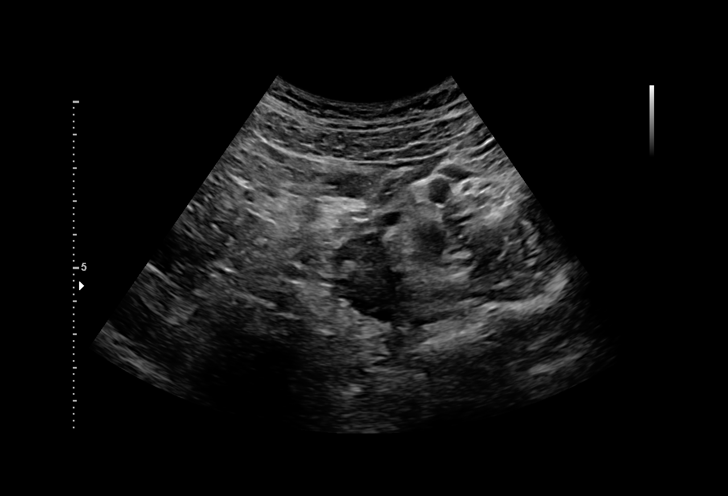
[im 26/59]
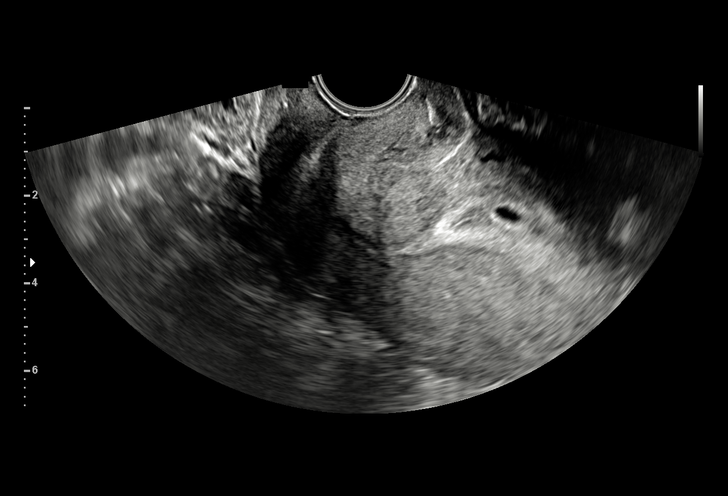
[im 31/59]
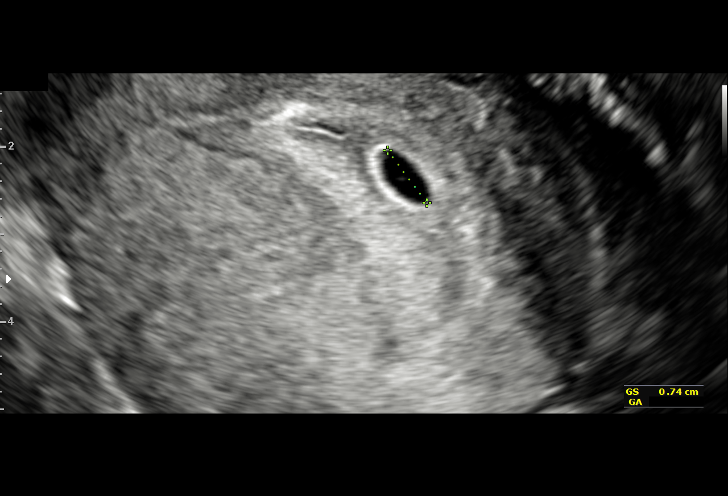
[im 33/59]
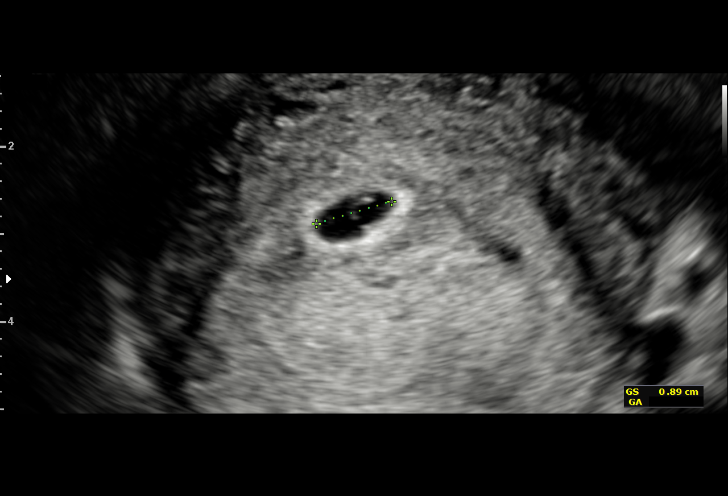
[im 37/59]
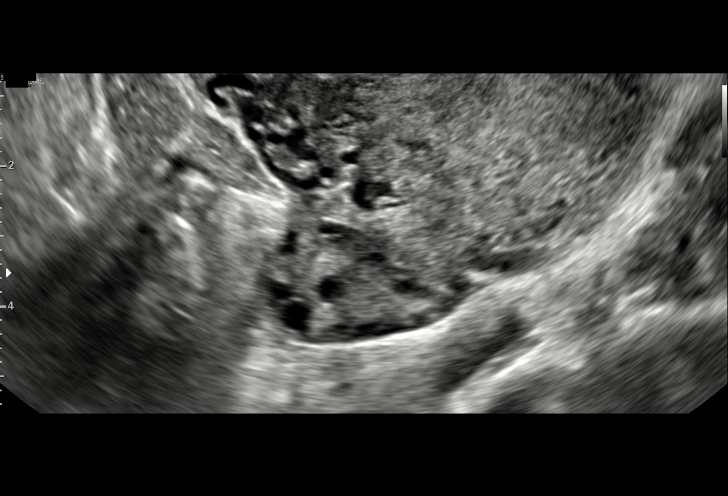
[im 41/59]
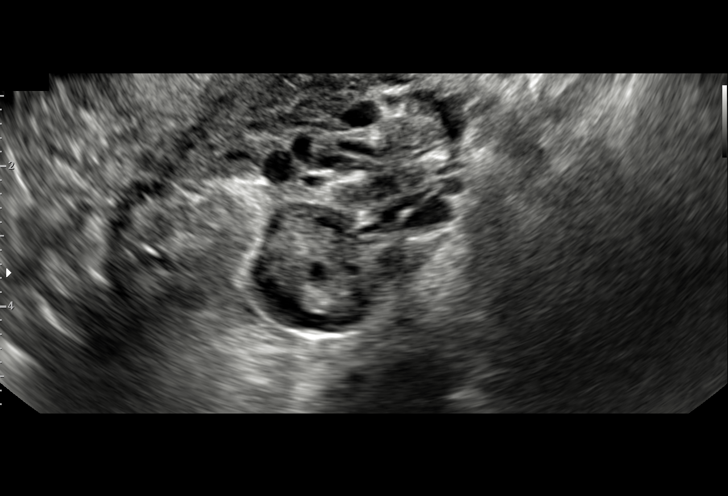
[im 46/59]
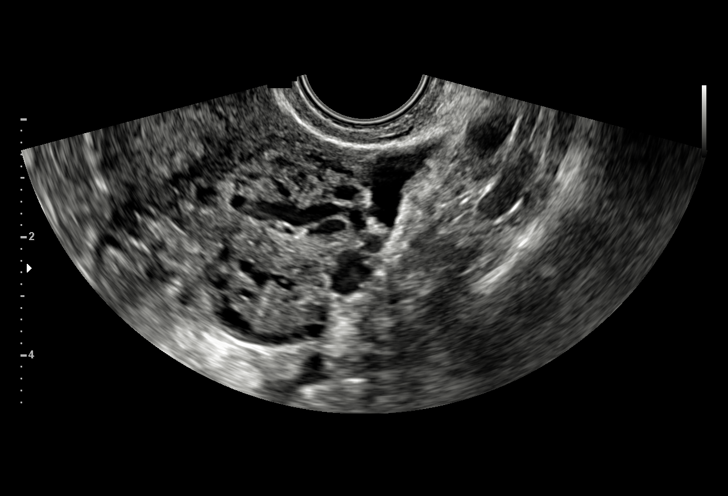
[im 50/59]
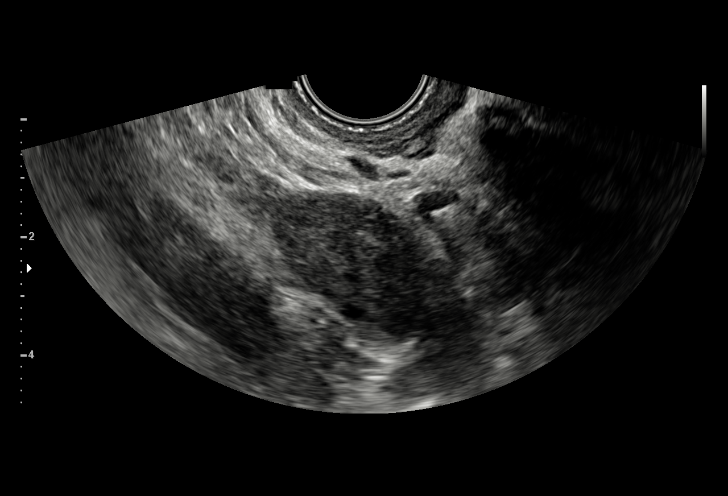
[im 54/59]
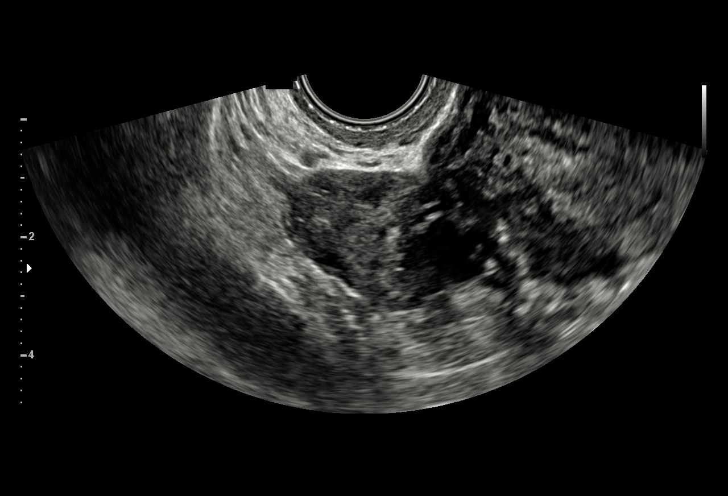
[im 59/59]
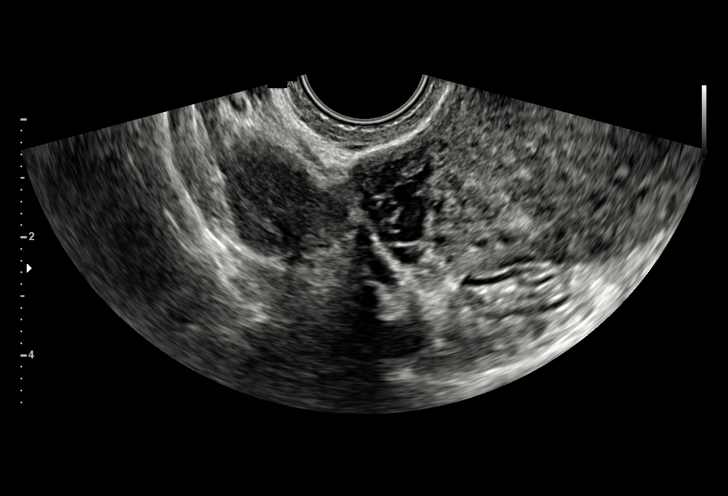

[15 of 28 positions shown; findings below may reference images not displayed]

FINDINGS: Intrauterine gestational sac: Single

Yolk sac:  Visualized.

Embryo:  Not Visualized.

MSD: 8.2  mm   5 w   4  d             US EDC: 09/09/2016

Subchorionic hemorrhage:  None visualized.

Maternal uterus/adnexae: The ovaries are unremarkable. A trace
amount of free fluid is noted. There is no evidence of adnexal mass.
IMPRESSION: Single intrauterine gestational sac with yolk sac - estimated
gestational age of 5 weeks 4 days by mean sac diameter. Fetal pole
not identified at this time.

No evidence of subchorionic hemorrhage.

Trace amount of free fluid.  No evidence of adnexal mass.

## 2018-10-29 ENCOUNTER — Ambulatory Visit (INDEPENDENT_AMBULATORY_CARE_PROVIDER_SITE_OTHER): Payer: Federal, State, Local not specified - PPO | Admitting: Psychology

## 2018-10-29 DIAGNOSIS — F331 Major depressive disorder, recurrent, moderate: Secondary | ICD-10-CM

## 2018-11-04 DIAGNOSIS — J4520 Mild intermittent asthma, uncomplicated: Secondary | ICD-10-CM | POA: Diagnosis not present

## 2018-11-04 DIAGNOSIS — J3081 Allergic rhinitis due to animal (cat) (dog) hair and dander: Secondary | ICD-10-CM | POA: Diagnosis not present

## 2018-11-04 DIAGNOSIS — F3340 Major depressive disorder, recurrent, in remission, unspecified: Secondary | ICD-10-CM | POA: Diagnosis not present

## 2018-11-04 DIAGNOSIS — F1020 Alcohol dependence, uncomplicated: Secondary | ICD-10-CM | POA: Diagnosis not present

## 2018-11-04 DIAGNOSIS — J301 Allergic rhinitis due to pollen: Secondary | ICD-10-CM | POA: Diagnosis not present

## 2018-11-04 DIAGNOSIS — J3089 Other allergic rhinitis: Secondary | ICD-10-CM | POA: Diagnosis not present

## 2018-11-04 DIAGNOSIS — F900 Attention-deficit hyperactivity disorder, predominantly inattentive type: Secondary | ICD-10-CM | POA: Diagnosis not present

## 2018-11-10 DIAGNOSIS — J301 Allergic rhinitis due to pollen: Secondary | ICD-10-CM | POA: Diagnosis not present

## 2018-11-11 DIAGNOSIS — J3089 Other allergic rhinitis: Secondary | ICD-10-CM | POA: Diagnosis not present

## 2018-11-11 DIAGNOSIS — J3081 Allergic rhinitis due to animal (cat) (dog) hair and dander: Secondary | ICD-10-CM | POA: Diagnosis not present

## 2018-11-18 DIAGNOSIS — J3089 Other allergic rhinitis: Secondary | ICD-10-CM | POA: Diagnosis not present

## 2018-11-18 DIAGNOSIS — J301 Allergic rhinitis due to pollen: Secondary | ICD-10-CM | POA: Diagnosis not present

## 2018-11-18 DIAGNOSIS — J3081 Allergic rhinitis due to animal (cat) (dog) hair and dander: Secondary | ICD-10-CM | POA: Diagnosis not present

## 2018-11-19 ENCOUNTER — Ambulatory Visit (INDEPENDENT_AMBULATORY_CARE_PROVIDER_SITE_OTHER): Payer: Federal, State, Local not specified - PPO | Admitting: Psychology

## 2018-11-19 DIAGNOSIS — F331 Major depressive disorder, recurrent, moderate: Secondary | ICD-10-CM

## 2018-11-20 DIAGNOSIS — J301 Allergic rhinitis due to pollen: Secondary | ICD-10-CM | POA: Diagnosis not present

## 2018-11-20 DIAGNOSIS — J3089 Other allergic rhinitis: Secondary | ICD-10-CM | POA: Diagnosis not present

## 2018-11-20 DIAGNOSIS — J3081 Allergic rhinitis due to animal (cat) (dog) hair and dander: Secondary | ICD-10-CM | POA: Diagnosis not present

## 2018-11-23 DIAGNOSIS — J3081 Allergic rhinitis due to animal (cat) (dog) hair and dander: Secondary | ICD-10-CM | POA: Diagnosis not present

## 2018-11-23 DIAGNOSIS — J3089 Other allergic rhinitis: Secondary | ICD-10-CM | POA: Diagnosis not present

## 2018-11-23 DIAGNOSIS — J301 Allergic rhinitis due to pollen: Secondary | ICD-10-CM | POA: Diagnosis not present

## 2018-11-26 ENCOUNTER — Ambulatory Visit (INDEPENDENT_AMBULATORY_CARE_PROVIDER_SITE_OTHER): Payer: Federal, State, Local not specified - PPO | Admitting: Psychology

## 2018-11-26 DIAGNOSIS — F331 Major depressive disorder, recurrent, moderate: Secondary | ICD-10-CM | POA: Diagnosis not present

## 2018-11-27 DIAGNOSIS — J3081 Allergic rhinitis due to animal (cat) (dog) hair and dander: Secondary | ICD-10-CM | POA: Diagnosis not present

## 2018-11-27 DIAGNOSIS — J301 Allergic rhinitis due to pollen: Secondary | ICD-10-CM | POA: Diagnosis not present

## 2018-11-27 DIAGNOSIS — J3089 Other allergic rhinitis: Secondary | ICD-10-CM | POA: Diagnosis not present

## 2018-11-30 DIAGNOSIS — J3081 Allergic rhinitis due to animal (cat) (dog) hair and dander: Secondary | ICD-10-CM | POA: Diagnosis not present

## 2018-11-30 DIAGNOSIS — J3089 Other allergic rhinitis: Secondary | ICD-10-CM | POA: Diagnosis not present

## 2018-11-30 DIAGNOSIS — J301 Allergic rhinitis due to pollen: Secondary | ICD-10-CM | POA: Diagnosis not present

## 2018-12-02 DIAGNOSIS — J3081 Allergic rhinitis due to animal (cat) (dog) hair and dander: Secondary | ICD-10-CM | POA: Diagnosis not present

## 2018-12-02 DIAGNOSIS — J3089 Other allergic rhinitis: Secondary | ICD-10-CM | POA: Diagnosis not present

## 2018-12-02 DIAGNOSIS — J301 Allergic rhinitis due to pollen: Secondary | ICD-10-CM | POA: Diagnosis not present

## 2018-12-03 ENCOUNTER — Ambulatory Visit (INDEPENDENT_AMBULATORY_CARE_PROVIDER_SITE_OTHER): Payer: Federal, State, Local not specified - PPO | Admitting: Psychology

## 2018-12-03 ENCOUNTER — Ambulatory Visit: Payer: Federal, State, Local not specified - PPO | Admitting: Psychology

## 2018-12-03 DIAGNOSIS — F331 Major depressive disorder, recurrent, moderate: Secondary | ICD-10-CM | POA: Diagnosis not present

## 2018-12-04 DIAGNOSIS — J301 Allergic rhinitis due to pollen: Secondary | ICD-10-CM | POA: Diagnosis not present

## 2018-12-04 DIAGNOSIS — J3081 Allergic rhinitis due to animal (cat) (dog) hair and dander: Secondary | ICD-10-CM | POA: Diagnosis not present

## 2018-12-04 DIAGNOSIS — J3089 Other allergic rhinitis: Secondary | ICD-10-CM | POA: Diagnosis not present

## 2018-12-07 DIAGNOSIS — J3081 Allergic rhinitis due to animal (cat) (dog) hair and dander: Secondary | ICD-10-CM | POA: Diagnosis not present

## 2018-12-07 DIAGNOSIS — J3089 Other allergic rhinitis: Secondary | ICD-10-CM | POA: Diagnosis not present

## 2018-12-07 DIAGNOSIS — J301 Allergic rhinitis due to pollen: Secondary | ICD-10-CM | POA: Diagnosis not present

## 2018-12-09 DIAGNOSIS — J301 Allergic rhinitis due to pollen: Secondary | ICD-10-CM | POA: Diagnosis not present

## 2018-12-09 DIAGNOSIS — J3089 Other allergic rhinitis: Secondary | ICD-10-CM | POA: Diagnosis not present

## 2018-12-09 DIAGNOSIS — J3081 Allergic rhinitis due to animal (cat) (dog) hair and dander: Secondary | ICD-10-CM | POA: Diagnosis not present

## 2018-12-10 ENCOUNTER — Ambulatory Visit (INDEPENDENT_AMBULATORY_CARE_PROVIDER_SITE_OTHER): Payer: Federal, State, Local not specified - PPO | Admitting: Psychology

## 2018-12-10 DIAGNOSIS — F330 Major depressive disorder, recurrent, mild: Secondary | ICD-10-CM | POA: Diagnosis not present

## 2018-12-11 DIAGNOSIS — J3081 Allergic rhinitis due to animal (cat) (dog) hair and dander: Secondary | ICD-10-CM | POA: Diagnosis not present

## 2018-12-11 DIAGNOSIS — J3089 Other allergic rhinitis: Secondary | ICD-10-CM | POA: Diagnosis not present

## 2018-12-11 DIAGNOSIS — J301 Allergic rhinitis due to pollen: Secondary | ICD-10-CM | POA: Diagnosis not present

## 2018-12-14 DIAGNOSIS — J3089 Other allergic rhinitis: Secondary | ICD-10-CM | POA: Diagnosis not present

## 2018-12-14 DIAGNOSIS — J301 Allergic rhinitis due to pollen: Secondary | ICD-10-CM | POA: Diagnosis not present

## 2018-12-14 DIAGNOSIS — J3081 Allergic rhinitis due to animal (cat) (dog) hair and dander: Secondary | ICD-10-CM | POA: Diagnosis not present

## 2018-12-16 DIAGNOSIS — J3081 Allergic rhinitis due to animal (cat) (dog) hair and dander: Secondary | ICD-10-CM | POA: Diagnosis not present

## 2018-12-16 DIAGNOSIS — J3089 Other allergic rhinitis: Secondary | ICD-10-CM | POA: Diagnosis not present

## 2018-12-16 DIAGNOSIS — J301 Allergic rhinitis due to pollen: Secondary | ICD-10-CM | POA: Diagnosis not present

## 2018-12-17 ENCOUNTER — Ambulatory Visit (INDEPENDENT_AMBULATORY_CARE_PROVIDER_SITE_OTHER): Payer: Federal, State, Local not specified - PPO | Admitting: Psychology

## 2018-12-17 DIAGNOSIS — F330 Major depressive disorder, recurrent, mild: Secondary | ICD-10-CM | POA: Diagnosis not present

## 2018-12-21 DIAGNOSIS — J3081 Allergic rhinitis due to animal (cat) (dog) hair and dander: Secondary | ICD-10-CM | POA: Diagnosis not present

## 2018-12-21 DIAGNOSIS — J3089 Other allergic rhinitis: Secondary | ICD-10-CM | POA: Diagnosis not present

## 2018-12-21 DIAGNOSIS — J301 Allergic rhinitis due to pollen: Secondary | ICD-10-CM | POA: Diagnosis not present

## 2018-12-24 ENCOUNTER — Ambulatory Visit (INDEPENDENT_AMBULATORY_CARE_PROVIDER_SITE_OTHER): Payer: Federal, State, Local not specified - PPO | Admitting: Psychology

## 2018-12-24 DIAGNOSIS — F331 Major depressive disorder, recurrent, moderate: Secondary | ICD-10-CM

## 2018-12-25 DIAGNOSIS — J3081 Allergic rhinitis due to animal (cat) (dog) hair and dander: Secondary | ICD-10-CM | POA: Diagnosis not present

## 2018-12-25 DIAGNOSIS — J3089 Other allergic rhinitis: Secondary | ICD-10-CM | POA: Diagnosis not present

## 2018-12-25 DIAGNOSIS — J301 Allergic rhinitis due to pollen: Secondary | ICD-10-CM | POA: Diagnosis not present

## 2018-12-28 DIAGNOSIS — J3081 Allergic rhinitis due to animal (cat) (dog) hair and dander: Secondary | ICD-10-CM | POA: Diagnosis not present

## 2018-12-28 DIAGNOSIS — J301 Allergic rhinitis due to pollen: Secondary | ICD-10-CM | POA: Diagnosis not present

## 2018-12-28 DIAGNOSIS — J3089 Other allergic rhinitis: Secondary | ICD-10-CM | POA: Diagnosis not present

## 2018-12-30 ENCOUNTER — Other Ambulatory Visit: Payer: Self-pay

## 2018-12-30 ENCOUNTER — Ambulatory Visit: Payer: Federal, State, Local not specified - PPO | Admitting: Medical

## 2018-12-30 ENCOUNTER — Encounter: Payer: Self-pay | Admitting: Medical

## 2018-12-30 VITALS — BP 112/72 | HR 99 | Temp 97.3°F | Ht 64.0 in | Wt 186.0 lb

## 2018-12-30 DIAGNOSIS — F3340 Major depressive disorder, recurrent, in remission, unspecified: Secondary | ICD-10-CM | POA: Diagnosis not present

## 2018-12-30 DIAGNOSIS — S39012A Strain of muscle, fascia and tendon of lower back, initial encounter: Secondary | ICD-10-CM

## 2018-12-30 DIAGNOSIS — M62830 Muscle spasm of back: Secondary | ICD-10-CM | POA: Diagnosis not present

## 2018-12-30 DIAGNOSIS — F1020 Alcohol dependence, uncomplicated: Secondary | ICD-10-CM | POA: Diagnosis not present

## 2018-12-30 DIAGNOSIS — Z23 Encounter for immunization: Secondary | ICD-10-CM | POA: Diagnosis not present

## 2018-12-30 DIAGNOSIS — F900 Attention-deficit hyperactivity disorder, predominantly inattentive type: Secondary | ICD-10-CM | POA: Diagnosis not present

## 2018-12-30 MED ORDER — CYCLOBENZAPRINE HCL 10 MG PO TABS: 10.0000 mg | ORAL_TABLET | Freq: Every evening | ORAL | 0 refills | Status: DC | PRN

## 2018-12-30 MED ORDER — IBUPROFEN 600 MG PO TABS: 600.0000 mg | ORAL_TABLET | Freq: Two times a day (BID) | ORAL | 0 refills | Status: DC

## 2018-12-30 NOTE — Progress Notes (Signed)
Subjective:  Krista [name] is a 33 y.o. female who presents for Chief Complaint  Patient presents with  . New Patient (Initial Visit)  . Back Pain    when taking deep breath-upper      Here as a new patient today  Medical team: [facility], [place] [facility]  Here for back pain.   Started about 4 days ago, left upper back, hurts in back, neck, some in left arm.  Left arm a little tingly.   No numbness, no severe pain. No recent fall, no injury, no trauma.  No fever.  No prior chronic pain.   Works as Automotive engineer.   No recent strenuous activity.   No change in sleeping position.  using nothing for symptoms.  No other aggravating or relieving factors.  No other c/o.  The following portions of the patient's history were reviewed and updated as appropriate: allergies, current medications, past family history, past medical history, past social history, past surgical history and problem list.  ROS Otherwise as in subjective above   Objective: BP 112/72   Pulse 99   Temp (!) 97.3 F (36.3 C)   Ht 5\' 4"  (1.626 m)   Wt 186 lb (84.4 kg)   SpO2 99%   BMI 31.93 kg/m   General appearance: alert, no distress, well developed, well nourished, AA female Neck: supple, no lymphadenopathy, no thyromegaly, no masses, normal ROM, mild tenderness left posterior neck, no midline tenderness Tender left upper back and supraspinatus, tender left rhomboids, +spasm, normal back ROM, no deformity Left arm normal ROM, nontender, no swelling or deformity, otherwise arm exam unremarkable Arms neurovascularly intact No ext edema Heart: RRR, normal S1, S2, no murmurs Lungs: CTA bilaterally, no wheezes, rhonchi, or rales Pulses: 2+ radial pulses, 2+ pedal pulses, normal cap refill    Assessment: Encounter Diagnoses  Name Primary?  . Back spasm Yes  . Back strain, initial encounter   . Need for influenza vaccination      Plan: Discussed findings  suggestive of mild back strain and spasm.   Discussed risks/benefits of medication, proper use of medication, caution with sedation on muscle relaxer.  Discussed the following recommendations and f/u.     Patient Instructions   Encounter Diagnoses  Name Primary?  . Back spasm Yes  . Back strain, initial encounter     Recommendations:  Rest  Use stretching  Consider massage  Use heat such as hot shower  Use the flexeril/cyclobenzaprine muscle relaxer at bedtime the next 2-3 days  Use the Ibuprofen twice daily with food for the next 3-5 days  symptoms should gradually resolve  If not improving within a week, then call or recheck     Krista [name] was seen today for new patient (initial visit) and back pain.  Diagnoses and all orders for this visit:  Back spasm  Back strain, initial encounter  Need for influenza vaccination -     Flu Vaccine QUAD 6+ mos PF IM (Fluarix Quad PF)  Other orders -     cyclobenzaprine (FLEXERIL) 10 MG tablet; Take 1 tablet (10 mg total) by mouth at bedtime as needed. -     ibuprofen (ADVIL) 600 MG tablet; Take 1 tablet (600 mg total) by mouth 2 (two) times daily.    Follow up: prn

## 2018-12-30 NOTE — Patient Instructions (Signed)
Encounter Diagnoses  Name Primary?  . Back spasm Yes  . Back strain, initial encounter     Recommendations:  Rest  Use stretching  Consider massage  Use heat such as hot shower  Use the flexeril/cyclobenzaprine muscle relaxer at bedtime the next 2-3 days  Use the Ibuprofen twice daily with food for the next 3-5 days  symptoms should gradually resolve  If not improving within a week, then call or recheck

## 2018-12-31 ENCOUNTER — Ambulatory Visit (INDEPENDENT_AMBULATORY_CARE_PROVIDER_SITE_OTHER): Payer: Federal, State, Local not specified - PPO | Admitting: Psychology

## 2018-12-31 DIAGNOSIS — F331 Major depressive disorder, recurrent, moderate: Secondary | ICD-10-CM | POA: Diagnosis not present

## 2019-01-01 DIAGNOSIS — J3089 Other allergic rhinitis: Secondary | ICD-10-CM | POA: Diagnosis not present

## 2019-01-01 DIAGNOSIS — J3081 Allergic rhinitis due to animal (cat) (dog) hair and dander: Secondary | ICD-10-CM | POA: Diagnosis not present

## 2019-01-01 DIAGNOSIS — J301 Allergic rhinitis due to pollen: Secondary | ICD-10-CM | POA: Diagnosis not present

## 2019-01-04 DIAGNOSIS — J301 Allergic rhinitis due to pollen: Secondary | ICD-10-CM | POA: Diagnosis not present

## 2019-01-04 DIAGNOSIS — J3089 Other allergic rhinitis: Secondary | ICD-10-CM | POA: Diagnosis not present

## 2019-01-04 DIAGNOSIS — J3081 Allergic rhinitis due to animal (cat) (dog) hair and dander: Secondary | ICD-10-CM | POA: Diagnosis not present

## 2019-01-07 ENCOUNTER — Ambulatory Visit: Payer: Federal, State, Local not specified - PPO | Admitting: Psychology

## 2019-01-08 DIAGNOSIS — J301 Allergic rhinitis due to pollen: Secondary | ICD-10-CM | POA: Diagnosis not present

## 2019-01-08 DIAGNOSIS — J3089 Other allergic rhinitis: Secondary | ICD-10-CM | POA: Diagnosis not present

## 2019-01-08 DIAGNOSIS — J3081 Allergic rhinitis due to animal (cat) (dog) hair and dander: Secondary | ICD-10-CM | POA: Diagnosis not present

## 2019-01-11 DIAGNOSIS — J301 Allergic rhinitis due to pollen: Secondary | ICD-10-CM | POA: Diagnosis not present

## 2019-01-11 DIAGNOSIS — J3081 Allergic rhinitis due to animal (cat) (dog) hair and dander: Secondary | ICD-10-CM | POA: Diagnosis not present

## 2019-01-11 DIAGNOSIS — J3089 Other allergic rhinitis: Secondary | ICD-10-CM | POA: Diagnosis not present

## 2019-01-12 ENCOUNTER — Ambulatory Visit (INDEPENDENT_AMBULATORY_CARE_PROVIDER_SITE_OTHER): Payer: Federal, State, Local not specified - PPO | Admitting: Psychology

## 2019-01-12 DIAGNOSIS — F331 Major depressive disorder, recurrent, moderate: Secondary | ICD-10-CM | POA: Diagnosis not present

## 2019-01-13 ENCOUNTER — Other Ambulatory Visit: Payer: Self-pay | Admitting: Medical

## 2019-01-14 ENCOUNTER — Ambulatory Visit (INDEPENDENT_AMBULATORY_CARE_PROVIDER_SITE_OTHER): Payer: Federal, State, Local not specified - PPO | Admitting: Psychology

## 2019-01-14 DIAGNOSIS — F331 Major depressive disorder, recurrent, moderate: Secondary | ICD-10-CM | POA: Diagnosis not present

## 2019-01-15 ENCOUNTER — Other Ambulatory Visit: Payer: Self-pay

## 2019-01-15 ENCOUNTER — Emergency Department (HOSPITAL_COMMUNITY)
Admission: EM | Admit: 2019-01-15 | Discharge: 2019-01-15 | Disposition: A | Discharge status: Home / Self Care | Payer: Federal, State, Local not specified - PPO | Attending: Emergency Medicine | Admitting: Emergency Medicine

## 2019-01-15 ENCOUNTER — Encounter (HOSPITAL_COMMUNITY): Payer: Self-pay | Admitting: Emergency Medicine

## 2019-01-15 DIAGNOSIS — R061 Stridor: Secondary | ICD-10-CM | POA: Diagnosis not present

## 2019-01-15 DIAGNOSIS — J3089 Other allergic rhinitis: Secondary | ICD-10-CM | POA: Diagnosis not present

## 2019-01-15 DIAGNOSIS — T886XXA Anaphylactic reaction due to adverse effect of correct drug or medicament properly administered, initial encounter: Secondary | ICD-10-CM | POA: Diagnosis not present

## 2019-01-15 DIAGNOSIS — Z79899 Other long term (current) drug therapy: Secondary | ICD-10-CM | POA: Diagnosis not present

## 2019-01-15 DIAGNOSIS — J301 Allergic rhinitis due to pollen: Secondary | ICD-10-CM | POA: Diagnosis not present

## 2019-01-15 DIAGNOSIS — T7840XA Allergy, unspecified, initial encounter: Secondary | ICD-10-CM

## 2019-01-15 DIAGNOSIS — R0789 Other chest pain: Secondary | ICD-10-CM | POA: Insufficient documentation

## 2019-01-15 DIAGNOSIS — J3081 Allergic rhinitis due to animal (cat) (dog) hair and dander: Secondary | ICD-10-CM | POA: Diagnosis not present

## 2019-01-15 DIAGNOSIS — T50995A Adverse effect of other drugs, medicaments and biological substances, initial encounter: Secondary | ICD-10-CM | POA: Diagnosis not present

## 2019-01-15 DIAGNOSIS — J80 Acute respiratory distress syndrome: Secondary | ICD-10-CM | POA: Diagnosis not present

## 2019-01-15 DIAGNOSIS — R0602 Shortness of breath: Secondary | ICD-10-CM | POA: Diagnosis not present

## 2019-01-15 MED ORDER — FAMOTIDINE 20 MG PO TABS: 20.0000 mg | ORAL_TABLET | Freq: Two times a day (BID) | ORAL | 0 refills | Status: DC

## 2019-01-15 MED ORDER — PREDNISONE 50 MG PO TABS: 50.0000 mg | ORAL_TABLET | Freq: Every day | ORAL | 0 refills | Status: DC

## 2019-01-15 MED ORDER — SODIUM CHLORIDE 0.9 % IV BOLUS
500.0000 mL | Freq: Once | INTRAVENOUS | Status: AC
  Administered 2019-01-15: 500 mL via INTRAVENOUS

## 2019-01-15 MED ORDER — DIPHENHYDRAMINE HCL 25 MG PO TABS: 25.0000 mg | ORAL_TABLET | Freq: Four times a day (QID) | ORAL | 0 refills | Status: DC | PRN

## 2019-01-15 MED ORDER — ALBUTEROL SULFATE HFA 108 (90 BASE) MCG/ACT IN AERS
4.0000 | INHALATION_SPRAY | Freq: Once | RESPIRATORY_TRACT | Status: AC
  Administered 2019-01-15: 4 via RESPIRATORY_TRACT
  Filled 2019-01-15: qty 6.7

## 2019-01-15 MED ORDER — FAMOTIDINE IN NACL 20-0.9 MG/50ML-% IV SOLN
20.0000 mg | Freq: Once | INTRAVENOUS | Status: AC
  Administered 2019-01-15: 20 mg via INTRAVENOUS
  Filled 2019-01-15: qty 50

## 2019-01-15 NOTE — ED Provider Notes (Signed)
[facility] DEPT Provider Note   CSN: [id number] Arrival date & time: 01/15/19  1615     History   Chief Complaint Chief Complaint  Patient presents with  . Allergic Reaction    HPI Krista [name] is a 33 y.o. female with history of anemia who presents with allergic reaction following allergy shots dog and pet dander.  She reports she started feeling like her tongue was tingling and swelling and then began having trouble breathing, nausea and abdominal pain.  She was given 0.3 epinephrine, 125 Solu-Medrol, 50 Benadryl.  She is feeling much better.  She has never had to use an EpiPen before.     HPI  Past Medical History:  Diagnosis Date  . Anemia    History of  . Cyst of right kidney     Patient Active Problem List   Diagnosis Date Noted  . SAB (spontaneous abortion) 08/12/2017  . 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 DILATION AND EVACUATION N/A 02/21/2016   Procedure: DILATATION AND EVACUATION;  Surgeon: Truett Mainland, DO;  Location: [facility];  Service: Gynecology;  Laterality: N/A;  . IUD REMOVAL  11/2016  . NO PAST SURGERIES    . ROBOTIC ASSITED PARTIAL NEPHRECTOMY Right 02/07/2017   Procedure: XI ROBOTIC ASSITED PARTIAL NEPHRECTOMY;  Surgeon: Alexis Frock, MD;  Location: [facility];  Service: Urology;  Laterality: Right;     OB History    Gravida  5   Para  2   Term  2   Preterm      AB  3   Living  2     SAB  2   TAB  1   Ectopic      Multiple      Live Births  2            Home Medications    Prior to Admission medications   Medication Sig Start Date End Date Taking? Authorizing Provider  albuterol (VENTOLIN HFA) 108 (90 Base) MCG/ACT inhaler Inhale 1-2 puffs into the lungs every 4 (four) hours as needed for wheezing or shortness of breath.  05/14/18 05/14/19 Yes [provider]  buPROPion (WELLBUTRIN XL) 150 MG 24 hr tablet Take 150 mg by  mouth 3 (three) times daily.  07/30/17  Yes [provider]  cyclobenzaprine (FLEXERIL) 10 MG tablet Take 1 tablet (10 mg total) by mouth at bedtime as needed. 12/30/18  Yes Tysinger, Camelia Eng, PA-C  EPINEPHrine 0.3 mg/0.3 mL IJ SOAJ injection Inject 0.3 mg into the muscle as needed for anaphylaxis.  11/04/18  Yes [provider]  escitalopram (LEXAPRO) 10 MG tablet Take 10 mg by mouth daily.   Yes [provider]  ibuprofen (ADVIL) 600 MG tablet TAKE 1 TABLET(600 MG) BY MOUTH TWICE DAILY Patient taking differently: Take 600 mg by mouth 2 (two) times daily.  01/13/19  Yes Tysinger, Camelia Eng, PA-C  mometasone (NASONEX) 50 MCG/ACT nasal spray Place 2 sprays into the nose daily.  12/29/18  Yes [provider]  Prenatal Multivit-Min-Fe-FA (PRENATAL VITAMINS) 0.8 MG tablet Take 1 tablet by mouth daily. 03/03/18  Yes Gavin Pound, CNM  tretinoin (RETIN-A) 0.05 % cream Apply 1 application topically at bedtime.  07/09/18  Yes [provider]  diphenhydrAMINE (BENADRYL) 25 MG tablet Take 1 tablet (25 mg total) by mouth every 6 (six) hours as needed. 01/15/19   Sahan Pen, Bea Graff, PA-C  famotidine (PEPCID)  20 MG tablet Take 1 tablet (20 mg total) by mouth 2 (two) times daily. 01/15/19   Demarquis Osley, Waylan Boga, PA-C  predniSONE (DELTASONE) 50 MG tablet Take 1 tablet (50 mg total) by mouth daily with breakfast. 01/15/19   Bearett Porcaro, Waylan Boga, PA-C    Family History Family History  Problem Relation Age of Onset  . Asthma Sister     Social History Social History   Tobacco Use  . Smoking status: Never Smoker  . Smokeless tobacco: Never Used  Substance Use Topics  . Alcohol use: Not Currently  . Drug use: No     Allergies   Shellfish allergy   Review of Systems Review of Systems  Constitutional: Negative for chills and fever.  HENT: Positive for facial swelling. Negative for sore throat.   Respiratory: Positive for shortness of breath.   Cardiovascular: Negative for  chest pain.  Gastrointestinal: Positive for abdominal pain and nausea. Negative for vomiting.  Genitourinary: Negative for dysuria.  Musculoskeletal: Negative for back pain.  Skin: Negative for rash and wound.  Neurological: Negative for headaches.  Psychiatric/Behavioral: The patient is not nervous/anxious.      Physical Exam Updated Vital Signs BP 115/71   Pulse 85   Temp 97.8 F (36.6 C) (Oral)   Resp 16   Ht 5\' 4"  (1.626 m)   Wt 84.8 kg   SpO2 100%   BMI 32.10 kg/m   Physical Exam Vitals signs and nursing note reviewed.  Constitutional:      General: She is not in acute distress.    Appearance: She is well-developed. She is not diaphoretic.  HENT:     Head: Normocephalic and atraumatic.     Mouth/Throat:     Pharynx: No oropharyngeal exudate.     Comments: Patent airway without swelling of the lips, tongue, posterior oropharynx Eyes:     General: No scleral icterus.       Right eye: No discharge.        Left eye: No discharge.     Conjunctiva/sclera: Conjunctivae normal.     Pupils: Pupils are equal, round, and reactive to light.  Neck:     Musculoskeletal: Normal range of motion and neck supple.     Thyroid: No thyromegaly.  Cardiovascular:     Rate and Rhythm: Normal rate and regular rhythm.     Heart sounds: Normal heart sounds. No murmur. No friction rub. No gallop.   Pulmonary:     Effort: Pulmonary effort is normal. No respiratory distress.     Breath sounds: Normal breath sounds. No stridor. No wheezing or rales.  Abdominal:     General: Bowel sounds are normal. There is no distension.     Palpations: Abdomen is soft.     Tenderness: There is no abdominal tenderness. There is no guarding or rebound.  Lymphadenopathy:     Cervical: No cervical adenopathy.  Skin:    General: Skin is warm and dry.     Coloration: Skin is not pale.     Findings: No rash.  Neurological:     Mental Status: She is alert.     Coordination: Coordination normal.       ED Treatments / Results  Labs (all labs ordered are listed, but only abnormal results are displayed) Labs Reviewed - No data to display  EKG None  Radiology No results found.  Procedures Procedures (including critical care time)  Medications Ordered in ED Medications  famotidine (PEPCID) IVPB 20 mg premix (0 mg  Intravenous Stopped 01/15/19 1740)  sodium chloride 0.9 % bolus 500 mL (0 mLs Intravenous Stopped 01/15/19 1841)  albuterol (VENTOLIN HFA) 108 (90 Base) MCG/ACT inhaler 4 puff (4 puffs Inhalation Given 01/15/19 1850)     Initial Impression / Assessment and Plan / ED Course  I have reviewed the triage vital signs and the nursing notes.  Pertinent labs & imaging results that were available during my care of the patient were reviewed by me and considered in my medical decision making (see chart for details).        Patient presenting following allergic reaction from allergy shots. She received EpiPen, benadryl, and solu-medrol PTA. She was already felling improved on arrival. Pepcid given additionally. Patient was also feeling a little chest tightness that felt similar to her asthma which was resolved with albuterol. No wheezing, lungs clear. No indication for chest x-ray at this time. O2 sats stable in upper 90s. Patient monitored in the ED for over 5 hours and no rebound symptoms. Will discharge home with prednisone, Benadryl, Pepcid. Patient already has EpiPen. Follow up with allergist as planned. Return precautions discussed. Patient vitals stable throughout ED course and discharged in satisfactory condition.  Final Clinical Impressions(s) / ED Diagnoses   Final diagnoses:  Allergic reaction, initial encounter    ED Discharge Orders         Ordered    predniSONE (DELTASONE) 50 MG tablet  Daily with breakfast     01/15/19 2145    diphenhydrAMINE (BENADRYL) 25 MG tablet  Every 6 hours PRN     01/15/19 2145    famotidine (PEPCID) 20 MG tablet  2 times daily      01/15/19 2145           Emi HolesLaw, Kadince Boxley M, PA-C 01/16/19 [id number]    Glynn Octaveancour, Stephen, MD 01/16/19 [phone]

## 2019-01-15 NOTE — Discharge Instructions (Signed)
Take prednisone as prescribed for 5 days.  Take Benadryl every 6 hours for 24 hours.  Take Pepcid twice daily for a week.  Use EpiPen if you develop any recurrent symptoms.  Please return the emergency department if you develop any new or worsening symptoms.

## 2019-01-15 NOTE — ED Triage Notes (Signed)
33 yo female BIB [facility]. Pt went to her allergist for routine visit. After shot administration pt began to c/o SOB. clinic called for allergic reaction. EMS states pt was diaphoretic upon their arrival. PT was given 2 rounds of epi prior to EMS arrival. Pts vitals stable upon EMS arrival as per EMS.  Vitals  bp 122/76 Hr 84 spo2 94% room air cbg 111 12 lead NSR  Pt given 50mg  of IM benadryl 2x 0.3mg  of IM epi 120mg  Depomedrol IM

## 2019-01-18 ENCOUNTER — Ambulatory Visit (INDEPENDENT_AMBULATORY_CARE_PROVIDER_SITE_OTHER): Payer: Federal, State, Local not specified - PPO | Admitting: Psychology

## 2019-01-18 DIAGNOSIS — F331 Major depressive disorder, recurrent, moderate: Secondary | ICD-10-CM

## 2019-01-20 DIAGNOSIS — J4520 Mild intermittent asthma, uncomplicated: Secondary | ICD-10-CM | POA: Diagnosis not present

## 2019-01-20 DIAGNOSIS — J3089 Other allergic rhinitis: Secondary | ICD-10-CM | POA: Diagnosis not present

## 2019-01-20 DIAGNOSIS — J301 Allergic rhinitis due to pollen: Secondary | ICD-10-CM | POA: Diagnosis not present

## 2019-01-20 DIAGNOSIS — J3081 Allergic rhinitis due to animal (cat) (dog) hair and dander: Secondary | ICD-10-CM | POA: Diagnosis not present

## 2019-01-28 ENCOUNTER — Ambulatory Visit (INDEPENDENT_AMBULATORY_CARE_PROVIDER_SITE_OTHER): Payer: Federal, State, Local not specified - PPO | Admitting: Psychology

## 2019-01-28 DIAGNOSIS — F331 Major depressive disorder, recurrent, moderate: Secondary | ICD-10-CM | POA: Diagnosis not present

## 2019-02-02 ENCOUNTER — Ambulatory Visit (INDEPENDENT_AMBULATORY_CARE_PROVIDER_SITE_OTHER): Payer: Federal, State, Local not specified - PPO | Admitting: Psychology

## 2019-02-02 DIAGNOSIS — F331 Major depressive disorder, recurrent, moderate: Secondary | ICD-10-CM | POA: Diagnosis not present

## 2019-02-04 ENCOUNTER — Ambulatory Visit: Payer: Federal, State, Local not specified - PPO | Admitting: Psychology

## 2019-02-11 ENCOUNTER — Ambulatory Visit (INDEPENDENT_AMBULATORY_CARE_PROVIDER_SITE_OTHER): Payer: Federal, State, Local not specified - PPO | Admitting: Psychology

## 2019-02-11 DIAGNOSIS — F331 Major depressive disorder, recurrent, moderate: Secondary | ICD-10-CM

## 2019-03-04 ENCOUNTER — Ambulatory Visit (INDEPENDENT_AMBULATORY_CARE_PROVIDER_SITE_OTHER): Payer: Federal, State, Local not specified - PPO | Admitting: Psychology

## 2019-03-04 DIAGNOSIS — F331 Major depressive disorder, recurrent, moderate: Secondary | ICD-10-CM | POA: Diagnosis not present

## 2019-03-09 ENCOUNTER — Ambulatory Visit (INDEPENDENT_AMBULATORY_CARE_PROVIDER_SITE_OTHER): Payer: Federal, State, Local not specified - PPO | Admitting: Psychology

## 2019-03-09 DIAGNOSIS — F331 Major depressive disorder, recurrent, moderate: Secondary | ICD-10-CM

## 2019-03-17 DIAGNOSIS — F900 Attention-deficit hyperactivity disorder, predominantly inattentive type: Secondary | ICD-10-CM | POA: Diagnosis not present

## 2019-03-17 DIAGNOSIS — F3340 Major depressive disorder, recurrent, in remission, unspecified: Secondary | ICD-10-CM | POA: Diagnosis not present

## 2019-03-17 DIAGNOSIS — F1020 Alcohol dependence, uncomplicated: Secondary | ICD-10-CM | POA: Diagnosis not present

## 2019-03-18 ENCOUNTER — Ambulatory Visit (INDEPENDENT_AMBULATORY_CARE_PROVIDER_SITE_OTHER): Payer: Federal, State, Local not specified - PPO | Admitting: Psychology

## 2019-03-18 DIAGNOSIS — F331 Major depressive disorder, recurrent, moderate: Secondary | ICD-10-CM

## 2019-03-22 ENCOUNTER — Encounter: Payer: Self-pay | Admitting: Medical

## 2019-03-22 ENCOUNTER — Other Ambulatory Visit: Payer: Self-pay

## 2019-03-22 ENCOUNTER — Ambulatory Visit: Payer: Federal, State, Local not specified - PPO | Admitting: Medical

## 2019-03-22 VITALS — BP 100/70 | HR 74 | Temp 98.2°F | Ht 64.0 in | Wt 191.4 lb

## 2019-03-22 DIAGNOSIS — R5383 Other fatigue: Secondary | ICD-10-CM | POA: Diagnosis not present

## 2019-03-22 DIAGNOSIS — F419 Anxiety disorder, unspecified: Secondary | ICD-10-CM | POA: Diagnosis not present

## 2019-03-22 DIAGNOSIS — E559 Vitamin D deficiency, unspecified: Secondary | ICD-10-CM | POA: Insufficient documentation

## 2019-03-22 DIAGNOSIS — E569 Vitamin deficiency, unspecified: Secondary | ICD-10-CM

## 2019-03-22 DIAGNOSIS — F32A Depression, unspecified: Secondary | ICD-10-CM | POA: Insufficient documentation

## 2019-03-22 DIAGNOSIS — Z862 Personal history of diseases of the blood and blood-forming organs and certain disorders involving the immune mechanism: Secondary | ICD-10-CM | POA: Diagnosis not present

## 2019-03-22 DIAGNOSIS — F329 Major depressive disorder, single episode, unspecified: Secondary | ICD-10-CM | POA: Diagnosis not present

## 2019-03-22 NOTE — Progress Notes (Signed)
Subjective: Chief Complaint  Patient presents with  . Fatigue   Here for fatigue.  Sees counselor Uvaldo Rising, but they advised she come in for testing to rule out cause of fatigue.   Sees therapist weekly x 1+ year.    Curious about vitamin deficiency or hormonal related.   Fatigue seems to interfere with day to day activity.   No prior abnormal thyroids issue. No family history of thyroid disease  Sleep apnea screening - Son says she snores, no witnessed apnea.  She notes feeling ok in the morning, not necessarily non restful sleeps.  Works at [employer] doing [employer], but gets up at 2:40am for work.   Shift is 4am - 12:30pm.  No family hx/o sleep apnea.   No prior sleep study.    Exercise - walks her dog.   Eats on average 3 times per day.   Eats fast food more than she should.  Cooks for her children.   Asthma - doing ok.  Using Symbicort daily for prevention, albuterol prn.  Recently hasn't had to use albuterol much.   Gets around 6-7 hours of sleep per night.  Her mental health [facility]   Past Medical History:  Diagnosis Date  . Anemia    History of  . Cyst of right kidney    Current Outpatient Medications on File Prior to Visit  Medication Sig Dispense Refill  . albuterol (VENTOLIN HFA) 108 (90 Base) MCG/ACT inhaler Inhale 1-2 puffs into the lungs every 4 (four) hours as needed for wheezing or shortness of breath.     . budesonide-formoterol (SYMBICORT) 160-4.5 MCG/ACT inhaler     . buPROPion (WELLBUTRIN XL) 150 MG 24 hr tablet Take 150 mg by mouth 3 (three) times daily.   0  . clindamycin-benzoyl peroxide (BENZACLIN) gel Apply 1 application topically every morning.    . escitalopram (LEXAPRO) 10 MG tablet Take 10 mg by mouth daily.    . mometasone (NASONEX) 50 MCG/ACT nasal spray Place 2 sprays into the nose daily.     . montelukast (SINGULAIR) 10 MG tablet Take 10 mg by mouth daily.    . Prenatal Multivit-Min-Fe-FA (PRENATAL  VITAMINS) 0.8 MG tablet Take 1 tablet by mouth daily. 90 tablet 3  . tretinoin (RETIN-A) 0.05 % cream Apply 1 application topically at bedtime.     . cyclobenzaprine (FLEXERIL) 10 MG tablet Take 1 tablet (10 mg total) by mouth at bedtime as needed. (Patient not taking: Reported on 03/22/2019) 10 tablet 0  . diphenhydrAMINE (BENADRYL) 25 MG tablet Take 1 tablet (25 mg total) by mouth every 6 (six) hours as needed. (Patient not taking: Reported on 03/22/2019) 30 tablet 0  . EPINEPHrine 0.3 mg/0.3 mL IJ SOAJ injection Inject 0.3 mg into the muscle as needed for anaphylaxis.     . famotidine (PEPCID) 20 MG tablet Take 1 tablet (20 mg total) by mouth 2 (two) times daily. (Patient not taking: Reported on 03/22/2019) 14 tablet 0  . ibuprofen (ADVIL) 600 MG tablet TAKE 1 TABLET(600 MG) BY MOUTH TWICE DAILY (Patient not taking: No sig reported) 30 tablet 0  . predniSONE (DELTASONE) 50 MG tablet Take 1 tablet (50 mg total) by mouth daily with breakfast. (Patient not taking: Reported on 03/22/2019) 5 tablet 0   No current facility-administered medications on file prior to visit.   ROS as in subjective   Objective: BP 100/70   Pulse 74   Temp 98.2 F (36.8 C)   Ht  5\' 4"  (1.626 m)   Wt 191 lb 6.4 oz (86.8 kg)   SpO2 98%   BMI 32.85 kg/m   Wt Readings from Last 3 Encounters:  03/22/19 191 lb 6.4 oz (86.8 kg)  01/15/19 187 lb (84.8 kg)  12/30/18 186 lb (84.4 kg)     General appearence: alert, no distress, WD/WN, African-American female HEENT: normocephalic, sclerae anicteric, PERRLA, EOMi, nares patent, no discharge or erythema, pharynx normal Oral cavity: MMM, no lesions Neck: supple, no lymphadenopathy, no thyromegaly, no masses, no bruits Heart: RRR, normal S1, S2, no murmurs Lungs: CTA bilaterally, no wheezes, rhonchi, or rales Abdomen: +bs, soft, non tender, non distended, no masses, no hepatomegaly, no splenomegaly Extremities: no edema, no cyanosis, no clubbing Pulses: 2+ symmetric, upper  and lower extremities, normal cap refill Neurological: alert, oriented x 3, CN2-12 intact, strength normal upper extremities and lower extremities, sensation normal throughout, DTRs 2+ throughout, no cerebellar signs, gait normal Psychiatric: normal affect, behavior normal, pleasant     Assessment: Encounter Diagnoses  Name Primary?  . Fatigue, unspecified type Yes  . History of anemia   . Anxiety and depression   . Vitamin deficiency      Plan: We discussed numerous causes of fatigue.  She does have underlying asthma which seems to be controlled, she does get up early at 2:30 AM to start her early workday.  She is being treated for depression and anxiety through the treatment center.  We discussed importance of regular exercise, healthy diet, eating 3 meals daily instead of 1 or 2 meals daily, drinking plenty of water throughout the day.  She can take a multivitamin daily.  Lab screening today for concerns   Krista [name] was seen today for fatigue.  Diagnoses and all orders for this visit:  Fatigue, unspecified type -     TSH -     Comprehensive metabolic panel -     CBC with Differential/Platelet -     VITAMIN D 25 Hydroxy (Vit-D Deficiency, Fractures)  History of anemia -     TSH -     Comprehensive metabolic panel -     CBC with Differential/Platelet -     VITAMIN D 25 Hydroxy (Vit-D Deficiency, Fractures)  Anxiety and depression -     TSH -     Comprehensive metabolic panel -     CBC with Differential/Platelet -     VITAMIN D 25 Hydroxy (Vit-D Deficiency, Fractures)  Vitamin deficiency -     TSH -     Comprehensive metabolic panel -     CBC with Differential/Platelet -     VITAMIN D 25 Hydroxy (Vit-D Deficiency, Fractures)  Follow-up pending labs

## 2019-03-23 ENCOUNTER — Other Ambulatory Visit: Payer: Self-pay | Admitting: Medical

## 2019-03-23 LAB — VITAMIN D 25 HYDROXY (VIT D DEFICIENCY, FRACTURES): Vit D, 25-Hydroxy: 19.4 ng/mL — ABNORMAL LOW (ref 30.0–100.0)

## 2019-03-23 LAB — CBC WITH DIFFERENTIAL/PLATELET
Basophils Absolute: 0 10*3/uL (ref 0.0–0.2)
Basos: 0 %
EOS (ABSOLUTE): 0.3 10*3/uL (ref 0.0–0.4)
Eos: 4 %
Hematocrit: 37.8 % (ref 34.0–46.6)
Hemoglobin: 12.8 g/dL (ref 11.1–15.9)
Immature Grans (Abs): 0 10*3/uL (ref 0.0–0.1)
Immature Granulocytes: 0 %
Lymphocytes Absolute: 2.7 10*3/uL (ref 0.7–3.1)
Lymphs: 36 %
MCH: 33 pg (ref 26.6–33.0)
MCHC: 33.9 g/dL (ref 31.5–35.7)
MCV: 97 fL (ref 79–97)
Monocytes Absolute: 1 10*3/uL — ABNORMAL HIGH (ref 0.1–0.9)
Monocytes: 14 %
Neutrophils Absolute: 3.3 10*3/uL (ref 1.4–7.0)
Neutrophils: 46 %
Platelets: 262 10*3/uL (ref 150–450)
RBC: 3.88 x10E6/uL (ref 3.77–5.28)
RDW: 11.7 % (ref 11.7–15.4)
WBC: 7.3 10*3/uL (ref 3.4–10.8)

## 2019-03-23 LAB — COMPREHENSIVE METABOLIC PANEL
ALT: 15 IU/L (ref 0–32)
AST: 15 IU/L (ref 0–40)
Albumin/Globulin Ratio: 1.9 (ref 1.2–2.2)
Albumin: 4.5 g/dL (ref 3.8–4.8)
Alkaline Phosphatase: 87 IU/L (ref 39–117)
BUN/Creatinine Ratio: 15 (ref 9–23)
BUN: 12 mg/dL (ref 6–20)
Bilirubin Total: 0.3 mg/dL (ref 0.0–1.2)
CO2: 26 mmol/L (ref 20–29)
Calcium: 9.9 mg/dL (ref 8.7–10.2)
Chloride: 104 mmol/L (ref 96–106)
Creatinine, Ser: 0.8 mg/dL (ref 0.57–1.00)
GFR calc Af Amer: 110 mL/min/{1.73_m2} (ref >59)
GFR calc non Af Amer: 96 mL/min/{1.73_m2} (ref >59)
Globulin, Total: 2.4 g/dL (ref 1.5–4.5)
Glucose: 66 mg/dL (ref 65–99)
Potassium: 4.3 mmol/L (ref 3.5–5.2)
Sodium: 142 mmol/L (ref 134–144)
Total Protein: 6.9 g/dL (ref 6.0–8.5)

## 2019-03-23 LAB — TSH: TSH: 0.741 u[IU]/mL (ref 0.450–4.500)

## 2019-03-23 MED ORDER — VITAMIN D 25 MCG (1000 UNIT) PO TABS: 1000.0000 [IU] | ORAL_TABLET | Freq: Every day | ORAL | 3 refills | Status: DC

## 2019-03-24 ENCOUNTER — Other Ambulatory Visit: Payer: Federal, State, Local not specified - PPO

## 2019-03-25 ENCOUNTER — Ambulatory Visit (INDEPENDENT_AMBULATORY_CARE_PROVIDER_SITE_OTHER): Payer: Federal, State, Local not specified - PPO | Admitting: Psychology

## 2019-03-25 DIAGNOSIS — F331 Major depressive disorder, recurrent, moderate: Secondary | ICD-10-CM | POA: Diagnosis not present

## 2019-04-01 ENCOUNTER — Ambulatory Visit (INDEPENDENT_AMBULATORY_CARE_PROVIDER_SITE_OTHER): Payer: Federal, State, Local not specified - PPO | Admitting: Psychology

## 2019-04-01 DIAGNOSIS — F331 Major depressive disorder, recurrent, moderate: Secondary | ICD-10-CM | POA: Diagnosis not present

## 2019-04-08 ENCOUNTER — Ambulatory Visit (INDEPENDENT_AMBULATORY_CARE_PROVIDER_SITE_OTHER): Payer: Federal, State, Local not specified - PPO | Admitting: Psychology

## 2019-04-08 DIAGNOSIS — F331 Major depressive disorder, recurrent, moderate: Secondary | ICD-10-CM

## 2019-04-15 ENCOUNTER — Ambulatory Visit (INDEPENDENT_AMBULATORY_CARE_PROVIDER_SITE_OTHER): Payer: Federal, State, Local not specified - PPO | Admitting: Psychology

## 2019-04-15 DIAGNOSIS — F331 Major depressive disorder, recurrent, moderate: Secondary | ICD-10-CM | POA: Diagnosis not present

## 2019-04-22 ENCOUNTER — Ambulatory Visit (INDEPENDENT_AMBULATORY_CARE_PROVIDER_SITE_OTHER): Payer: Federal, State, Local not specified - PPO | Admitting: Psychology

## 2019-04-22 DIAGNOSIS — F331 Major depressive disorder, recurrent, moderate: Secondary | ICD-10-CM | POA: Diagnosis not present

## 2019-04-29 ENCOUNTER — Ambulatory Visit (INDEPENDENT_AMBULATORY_CARE_PROVIDER_SITE_OTHER): Payer: Federal, State, Local not specified - PPO | Admitting: Psychology

## 2019-04-29 DIAGNOSIS — F331 Major depressive disorder, recurrent, moderate: Secondary | ICD-10-CM

## 2019-05-06 ENCOUNTER — Ambulatory Visit: Payer: Federal, State, Local not specified - PPO | Admitting: Psychology

## 2019-05-11 ENCOUNTER — Encounter: Payer: Self-pay | Admitting: Advanced Practice Midwife

## 2019-05-11 ENCOUNTER — Ambulatory Visit (INDEPENDENT_AMBULATORY_CARE_PROVIDER_SITE_OTHER): Payer: Federal, State, Local not specified - PPO | Admitting: Advanced Practice Midwife

## 2019-05-11 ENCOUNTER — Other Ambulatory Visit: Payer: Self-pay

## 2019-05-11 VITALS — BP 112/77 | HR 96 | Ht 64.0 in | Wt 196.4 lb

## 2019-05-11 DIAGNOSIS — Z0000 Encounter for general adult medical examination without abnormal findings: Secondary | ICD-10-CM

## 2019-05-11 DIAGNOSIS — R102 Pelvic and perineal pain: Secondary | ICD-10-CM | POA: Diagnosis not present

## 2019-05-11 DIAGNOSIS — Z113 Encounter for screening for infections with a predominantly sexual mode of transmission: Secondary | ICD-10-CM | POA: Diagnosis not present

## 2019-05-11 DIAGNOSIS — R5383 Other fatigue: Secondary | ICD-10-CM | POA: Diagnosis not present

## 2019-05-11 NOTE — Progress Notes (Signed)
Subjective:     Krista [name] is a 34 y.o. female here at [facility] for a routine exam.  Current complaints: Cramping before and during periods and fatigue during periods. Menses are light/moderate, last 3-4 days and regular. She declines need for contraception.   Personal health questionnaire reviewed: yes.  Do you have a primary care provider? Yes  Do you feel safe at home? yes Has anyone hit, slapped, or kicked you recently? No     Gynecologic History Patient's last menstrual period was 04/23/2019 (within days). Contraception: none Last Pap: 03/03/2018 Results were: normal Last mammogram: n/a.   Obstetric History OB History  Gravida Para Term Preterm AB Living  5 2 2   3 2   SAB TAB Ectopic Multiple Live Births  2 1     2     # Outcome Date GA Lbr Len/2nd Weight Sex Delivery Anes PTL Lv  5 SAB 07/08/17 [redacted]w[redacted]d         4 SAB 02/21/16 [redacted]w[redacted]d         3 Term 07/23/09    M Vag-Spont Other  LIV  2 TAB 2008          1 Term 09/16/05    M Vag-Spont EPI  LIV     The following portions of the patient's history were reviewed and updated as appropriate: allergies, current medications, past family history, past medical history, past social history, past surgical history and problem list.  Review of Systems Pertinent items noted in HPI and remainder of comprehensive ROS otherwise negative.    Objective:  BP 112/77   Pulse 96   Ht 5\' 4"  (1.626 m)   Wt 196 lb 6.4 oz (89.1 kg)   LMP 04/23/2019 (Within Days)   BMI 33.71 kg/m   VS reviewed, nursing note reviewed,  Constitutional: well developed, well nourished, no distress HEENT: normocephalic CV: normal rate Pulm/chest wall: normal effort Breast Exam: Deferred with shared decision making due to current recommendations Abdomen: soft Neuro: alert and oriented x 3 Skin: warm, dry Psych: affect normal Pelvic exam: Speculum exam deferred Bimanual exam: Cervix closed, firm, anterior, neg CMT, uterus nontender, nonenlarged, tenderness in  right adnexa on exam, no enlargement or mass palpable, left adnexa without tenderness, enlargement, or mass      Assessment/Plan:   1. Screen for STD (sexually transmitted disease) - Cervicovaginal ancillary only( [facility]) - Hepatitis B surface antigen - Hepatitis C antibody - HIV Antibody (routine testing w rflx) - RPR  2. Other fatigue --Pt with fatigue during menses. Menses are light, regular, and last 3-4 days only. --Pt had CBC and TSH recently at PCP, reviewed results with pt and Hgb 12.8, TSH 0.7, so no cause for fatigue identified.  3. Well woman exam (no gynecological exam) --Doing well, overall no problems. Declines contraception at this time.  4. Adnexal pain --Pain in RLQ x 3-4 months, at first associated with menses but now occurs at random in between. It is a sharp shooting groin/abdominal pain that is intermittent. - 09/18/05 PELVIC COMPLETE WITH TRANSVAGINAL; Future     Follow up in: 1 year  or as needed.   , CNM 1:44 PM

## 2019-05-11 NOTE — Patient Instructions (Signed)
Pelvic Pain, Female Pelvic pain is pain in your lower abdomen, below your belly button and between your hips. The pain may start suddenly (be acute), keep coming back (be recurring), or last a long time (become chronic). Pelvic pain that lasts longer than 6 months is considered chronic. Pelvic pain may affect your:  Reproductive organs.  Urinary system.  Digestive tract.  Musculoskeletal system. There are many potential causes of pelvic pain. Sometimes, the pain can be a result of digestive or urinary conditions, strained muscles or ligaments, or reproductive conditions. Sometimes the cause of pelvic pain is not known. Follow these instructions at home:   Take over-the-counter and prescription medicines only as told by your health care provider.  Rest as told by your health care provider.  Do not have sex if it hurts.  Keep a journal of your pelvic pain. Write down: ? When the pain started. ? Where the pain is located. ? What seems to make the pain better or worse, such as food or your period (menstrual cycle). ? Any symptoms you have along with the pain.  Keep all follow-up visits as told by your health care provider. This is important. Contact a health care provider if:  Medicine does not help your pain.  Your pain comes back.  You have new symptoms.  You have abnormal vaginal discharge or bleeding, including bleeding after menopause.  You have a fever or chills.  You are constipated.  You have blood in your urine or stool.  You have foul-smelling urine.  You feel weak or light-headed. Get help right away if:  You have sudden severe pain.  Your pain gets steadily worse.  You have severe pain along with fever, nausea, vomiting, or excessive sweating.  You lose consciousness. Summary  Pelvic pain is pain in your lower abdomen, below your belly button and between your hips.  There are many potential causes of pelvic pain.  Keep a journal of your pelvic  pain. This information is not intended to replace advice given to you by your health care provider. Make sure you discuss any questions you have with your health care provider. Document Revised: 07/30/2017 Document Reviewed: 07/30/2017 Elsevier Patient Education  2020 Elsevier Inc.  

## 2019-05-11 NOTE — Progress Notes (Signed)
Pt presents for annual and all STD testing.  Pt c/o premenstrual cramps and fatigue during menstrual cycles, denies heavy periods Declines BC at this time  Negative pap 02/2018

## 2019-05-12 LAB — CERVICOVAGINAL ANCILLARY ONLY
Chlamydia: NEGATIVE
Comment: NEGATIVE
Comment: NEGATIVE
Comment: NORMAL
Neisseria Gonorrhea: NEGATIVE
Trichomonas: NEGATIVE

## 2019-05-12 LAB — HIV ANTIBODY (ROUTINE TESTING W REFLEX): HIV Screen 4th Generation wRfx: NONREACTIVE

## 2019-05-12 LAB — HEPATITIS B SURFACE ANTIGEN: Hepatitis B Surface Ag: NEGATIVE

## 2019-05-12 LAB — HEPATITIS C ANTIBODY: Hep C Virus Ab: <0.1 s/co ratio (ref 0.0–0.9)

## 2019-05-12 LAB — RPR: RPR Ser Ql: NONREACTIVE

## 2019-05-13 ENCOUNTER — Ambulatory Visit (INDEPENDENT_AMBULATORY_CARE_PROVIDER_SITE_OTHER): Payer: Federal, State, Local not specified - PPO | Admitting: Psychology

## 2019-05-13 DIAGNOSIS — F331 Major depressive disorder, recurrent, moderate: Secondary | ICD-10-CM

## 2019-05-14 ENCOUNTER — Ambulatory Visit
Admission: RE | Admit: 2019-05-14 | Discharge: 2019-05-14 | Disposition: A | Discharge status: Home / Self Care | Payer: Federal, State, Local not specified - PPO | Source: Ambulatory Visit | Attending: Advanced Practice Midwife | Admitting: Advanced Practice Midwife

## 2019-05-14 DIAGNOSIS — R102 Pelvic and perineal pain: Secondary | ICD-10-CM

## 2019-05-20 ENCOUNTER — Ambulatory Visit (INDEPENDENT_AMBULATORY_CARE_PROVIDER_SITE_OTHER): Payer: Federal, State, Local not specified - PPO | Admitting: Psychology

## 2019-05-20 DIAGNOSIS — F331 Major depressive disorder, recurrent, moderate: Secondary | ICD-10-CM

## 2019-05-21 ENCOUNTER — Telehealth: Payer: Self-pay | Admitting: Medical

## 2019-05-21 NOTE — Telephone Encounter (Signed)
Referral has been placed and patient has been informed of the needs to call and schedule follow up

## 2019-05-21 NOTE — Telephone Encounter (Signed)
Please let her know we will refer for sleep study through [facility], home study.   Go ahead and make her a follow up appt for about 2-3 weeks to give time to have study done.

## 2019-05-27 ENCOUNTER — Ambulatory Visit (INDEPENDENT_AMBULATORY_CARE_PROVIDER_SITE_OTHER): Payer: Federal, State, Local not specified - PPO | Admitting: Psychology

## 2019-05-27 DIAGNOSIS — F331 Major depressive disorder, recurrent, moderate: Secondary | ICD-10-CM

## 2019-06-03 DIAGNOSIS — Z20822 Contact with and (suspected) exposure to covid-19: Secondary | ICD-10-CM | POA: Diagnosis not present

## 2019-06-03 DIAGNOSIS — R05 Cough: Secondary | ICD-10-CM | POA: Diagnosis not present

## 2019-06-03 DIAGNOSIS — J029 Acute pharyngitis, unspecified: Secondary | ICD-10-CM | POA: Diagnosis not present

## 2019-06-07 DIAGNOSIS — J4520 Mild intermittent asthma, uncomplicated: Secondary | ICD-10-CM | POA: Diagnosis not present

## 2019-06-07 DIAGNOSIS — J3081 Allergic rhinitis due to animal (cat) (dog) hair and dander: Secondary | ICD-10-CM | POA: Diagnosis not present

## 2019-06-07 DIAGNOSIS — J301 Allergic rhinitis due to pollen: Secondary | ICD-10-CM | POA: Diagnosis not present

## 2019-06-07 DIAGNOSIS — J3089 Other allergic rhinitis: Secondary | ICD-10-CM | POA: Diagnosis not present

## 2019-06-10 ENCOUNTER — Ambulatory Visit: Payer: Federal, State, Local not specified - PPO | Admitting: Psychology

## 2019-06-14 ENCOUNTER — Ambulatory Visit (INDEPENDENT_AMBULATORY_CARE_PROVIDER_SITE_OTHER): Payer: Federal, State, Local not specified - PPO | Admitting: Psychology

## 2019-06-14 DIAGNOSIS — F3181 Bipolar II disorder: Secondary | ICD-10-CM

## 2019-06-15 ENCOUNTER — Encounter: Payer: Self-pay | Admitting: Medical

## 2019-06-15 ENCOUNTER — Other Ambulatory Visit: Payer: Self-pay

## 2019-06-15 ENCOUNTER — Ambulatory Visit: Payer: Federal, State, Local not specified - PPO | Admitting: Medical

## 2019-06-15 VITALS — BP 110/70 | HR 89 | Temp 98.2°F | Ht 64.0 in | Wt 200.2 lb

## 2019-06-15 DIAGNOSIS — Z91013 Allergy to seafood: Secondary | ICD-10-CM

## 2019-06-15 DIAGNOSIS — R5383 Other fatigue: Secondary | ICD-10-CM | POA: Diagnosis not present

## 2019-06-15 DIAGNOSIS — R0683 Snoring: Secondary | ICD-10-CM | POA: Insufficient documentation

## 2019-06-15 DIAGNOSIS — F419 Anxiety disorder, unspecified: Secondary | ICD-10-CM

## 2019-06-15 DIAGNOSIS — R400 Somnolence: Secondary | ICD-10-CM | POA: Insufficient documentation

## 2019-06-15 DIAGNOSIS — F32A Depression, unspecified: Secondary | ICD-10-CM

## 2019-06-15 DIAGNOSIS — E559 Vitamin D deficiency, unspecified: Secondary | ICD-10-CM

## 2019-06-15 DIAGNOSIS — F329 Major depressive disorder, single episode, unspecified: Secondary | ICD-10-CM

## 2019-06-15 NOTE — Progress Notes (Signed)
Subjective: Chief Complaint  Patient presents with  . Follow-up    vitamin d    Here for f/u on fatigue.   I saw her in March for same.   We did screening labs and she was low on Vitamin D but otherwise BMET, CBC, and TSH normal.  She is compliant with daily vitamin D 1000u.  She does not eat fish due to seafood allergy  She had her sleep study 2 weeks ago and here to f/u on this.  She is not exercising.  She is the heaviest she has ever been  She is on medication for mood and depression but gets bad lows with her periods.   Not currently on OCP.  Not currently sexually active.  Past Medical History:  Diagnosis Date  . Anemia    History of  . Cyst of right kidney    Current Outpatient Medications on File Prior to Visit  Medication Sig Dispense Refill  . budesonide-formoterol (SYMBICORT) 160-4.5 MCG/ACT inhaler     . buPROPion (WELLBUTRIN XL) 150 MG 24 hr tablet Take 150 mg by mouth 3 (three) times daily.   0  . cetirizine (ZYRTEC) 10 MG tablet Take 10 mg by mouth daily.    . cholecalciferol (VITAMIN D3) 25 MCG (1000 UNIT) tablet Take 1 tablet (1,000 Units total) by mouth daily. 90 tablet 3  . clindamycin-benzoyl peroxide (BENZACLIN) gel Apply 1 application topically every morning.    . escitalopram (LEXAPRO) 10 MG tablet Take 10 mg by mouth daily.    . mometasone (NASONEX) 50 MCG/ACT nasal spray Place 2 sprays into the nose daily.     . montelukast (SINGULAIR) 10 MG tablet Take 10 mg by mouth daily.    . Prenatal Multivit-Min-Fe-FA (PRENATAL VITAMINS) 0.8 MG tablet Take 1 tablet by mouth daily. 90 tablet 3  . tretinoin (RETIN-A) 0.05 % cream Apply 1 application topically at bedtime.     Marland Kitchen EPINEPHrine 0.3 mg/0.3 mL IJ SOAJ injection Inject 0.3 mg into the muscle as needed for anaphylaxis.      No current facility-administered medications on file prior to visit.   ROS as in subjective   Objective: BP 110/70   Pulse 89   Temp 98.2 F (36.8 C)   Ht 5\' 4"  (1.626 m)   Wt 200  lb 3.2 oz (90.8 kg)   SpO2 97%   BMI 34.36 kg/m   Wt Readings from Last 3 Encounters:  06/15/19 200 lb 3.2 oz (90.8 kg)  05/11/19 196 lb 6.4 oz (89.1 kg)  03/22/19 191 lb 6.4 oz (86.8 kg)   Gen: wd, wn, nad Psych pleasant, good eye contact, answers questions appropriately    Assessment: Encounter Diagnoses  Name Primary?  . Fatigue, unspecified type Yes  . Vitamin D deficiency   . Snoring   . Daytime somnolence   . Seafood allergy   . Anxiety and depression       Plan: Discussed her fatigue concerns, possible causes including likely PMDD, depression, exercise intolerance/ deconditioning, obesity, or other.   We reviewed her recent home sleep study showing AHI 5, mild sleep apnea, not enough to pursue CPAP.  Strongly advised she begin exercise program.  counseled on diet, exercise, strategies to help fatigue.  I recommended she see gynecology about possible OCP to help with hormones, mood in light of likely PMDD.      Repeat Vit D lab today.  C/t supplement.   Wen was seen today for follow-up.  Diagnoses and all orders  for this visit:  Fatigue, unspecified type  Vitamin D deficiency -     VITAMIN D 25 Hydroxy (Vit-D Deficiency, Fractures)  Snoring  Daytime somnolence  Seafood allergy  Anxiety and depression  f/u 1-2 months if not making progress on her fatigue and lifestyle changes

## 2019-06-16 ENCOUNTER — Other Ambulatory Visit: Payer: Self-pay | Admitting: Medical

## 2019-06-16 ENCOUNTER — Other Ambulatory Visit: Payer: Self-pay

## 2019-06-16 LAB — VITAMIN D 25 HYDROXY (VIT D DEFICIENCY, FRACTURES): Vit D, 25-Hydroxy: 22.9 ng/mL — ABNORMAL LOW (ref 30.0–100.0)

## 2019-06-16 MED ORDER — VITAMIN D 25 MCG (1000 UNIT) PO TABS: 2000.0000 [IU] | ORAL_TABLET | Freq: Every day | ORAL | 3 refills | Status: DC

## 2019-06-17 ENCOUNTER — Ambulatory Visit: Payer: Federal, State, Local not specified - PPO | Admitting: Psychology

## 2019-06-22 ENCOUNTER — Ambulatory Visit (INDEPENDENT_AMBULATORY_CARE_PROVIDER_SITE_OTHER): Payer: Federal, State, Local not specified - PPO | Admitting: Psychology

## 2019-06-22 DIAGNOSIS — F331 Major depressive disorder, recurrent, moderate: Secondary | ICD-10-CM

## 2019-06-24 ENCOUNTER — Ambulatory Visit: Payer: Federal, State, Local not specified - PPO | Admitting: Psychology

## 2019-06-25 ENCOUNTER — Encounter: Payer: Self-pay | Admitting: Medical

## 2019-06-29 ENCOUNTER — Ambulatory Visit (INDEPENDENT_AMBULATORY_CARE_PROVIDER_SITE_OTHER): Payer: Federal, State, Local not specified - PPO | Admitting: Psychology

## 2019-06-29 DIAGNOSIS — F331 Major depressive disorder, recurrent, moderate: Secondary | ICD-10-CM

## 2019-07-01 ENCOUNTER — Ambulatory Visit: Payer: Federal, State, Local not specified - PPO | Admitting: Psychology

## 2019-07-06 ENCOUNTER — Ambulatory Visit (INDEPENDENT_AMBULATORY_CARE_PROVIDER_SITE_OTHER): Payer: Federal, State, Local not specified - PPO | Admitting: Psychology

## 2019-07-06 DIAGNOSIS — F331 Major depressive disorder, recurrent, moderate: Secondary | ICD-10-CM

## 2019-07-12 ENCOUNTER — Ambulatory Visit (INDEPENDENT_AMBULATORY_CARE_PROVIDER_SITE_OTHER): Payer: Federal, State, Local not specified - PPO | Admitting: Psychology

## 2019-07-12 DIAGNOSIS — F331 Major depressive disorder, recurrent, moderate: Secondary | ICD-10-CM

## 2019-07-14 ENCOUNTER — Ambulatory Visit (INDEPENDENT_AMBULATORY_CARE_PROVIDER_SITE_OTHER): Payer: Federal, State, Local not specified - PPO | Admitting: Psychology

## 2019-07-14 DIAGNOSIS — F331 Major depressive disorder, recurrent, moderate: Secondary | ICD-10-CM

## 2019-07-19 ENCOUNTER — Encounter: Payer: Self-pay | Admitting: Obstetrics & Gynecology

## 2019-07-19 ENCOUNTER — Ambulatory Visit: Payer: Federal, State, Local not specified - PPO | Admitting: Obstetrics & Gynecology

## 2019-07-19 ENCOUNTER — Other Ambulatory Visit: Payer: Self-pay

## 2019-07-19 VITALS — BP 119/75 | HR 96 | Ht 61.0 in | Wt 198.8 lb

## 2019-07-19 DIAGNOSIS — R102 Pelvic and perineal pain: Secondary | ICD-10-CM | POA: Diagnosis not present

## 2019-07-19 DIAGNOSIS — G8929 Other chronic pain: Secondary | ICD-10-CM

## 2019-07-19 DIAGNOSIS — Z23 Encounter for immunization: Secondary | ICD-10-CM | POA: Diagnosis not present

## 2019-07-19 DIAGNOSIS — Z30011 Encounter for initial prescription of contraceptive pills: Secondary | ICD-10-CM | POA: Diagnosis not present

## 2019-07-19 DIAGNOSIS — IMO0001 Reserved for inherently not codable concepts without codable children: Secondary | ICD-10-CM

## 2019-07-19 DIAGNOSIS — Z30019 Encounter for initial prescription of contraceptives, unspecified: Secondary | ICD-10-CM

## 2019-07-19 DIAGNOSIS — F3281 Premenstrual dysphoric disorder: Secondary | ICD-10-CM

## 2019-07-19 MED ORDER — LEVONORGEST-ETH ESTRAD 91-DAY 0.15-0.03 MG PO TABS: 1.0000 | ORAL_TABLET | Freq: Every day | ORAL | 4 refills | Status: DC

## 2019-07-19 NOTE — Progress Notes (Signed)
GYNECOLOGY OFFICE VISIT NOTE  History:   Krista [name] is a 34 y.o. [phone] here today for evaluation and management of chronic RLQ pain that worsens during menstruation.  Was seen by Fatima Blank, CNM on 05/11/2019 and her symptoms were concerning for possible endometriosis.  Patient also has PMDD, she is interested in starting oral contraception.  She denies current any abnormal vaginal discharge, bleeding, pelvic pain or other concerns.    Past Medical History:  Diagnosis Date  . Anemia    History of  . Cyst of right kidney     Past Surgical History:  Procedure Laterality Date  . DILATION AND EVACUATION N/A 02/21/2016   Procedure: DILATATION AND EVACUATION;  Surgeon: Truett Mainland, DO;  Location: [facility];  Service: Gynecology;  Laterality: N/A;  . IUD REMOVAL  11/2016  . NO PAST SURGERIES    . ROBOTIC ASSITED PARTIAL NEPHRECTOMY Right 02/07/2017   Procedure: XI ROBOTIC ASSITED PARTIAL NEPHRECTOMY;  Surgeon: Alexis Frock, MD;  Location: [facility];  Service: Urology;  Laterality: Right;    The following portions of the patient's history were reviewed and updated as appropriate: allergies, current medications, past family history, past medical history, past social history, past surgical history and problem list.   Health Maintenance:  Normal pap and negative HRHPV on 02/26/2018.    Review of Systems:  Pertinent items noted in HPI and remainder of comprehensive ROS otherwise negative.  Physical Exam:  BP 119/75   Pulse 96   Ht 5\' 1"  (1.549 m)   Wt 198 lb 12.8 oz (90.2 kg)   LMP 07/19/2019   BMI 37.56 kg/m  CONSTITUTIONAL: Well-developed, well-nourished female in no acute distress.  HEENT:  Normocephalic, atraumatic. External right and left ear normal. No scleral icterus.  NECK: Normal range of motion, supple, no masses noted on observation SKIN: No rash noted. Not diaphoretic. No erythema. No pallor. MUSCULOSKELETAL: Normal range of motion. No edema  noted. NEUROLOGIC: Alert and oriented to person, place, and time. Normal muscle tone coordination. No cranial nerve deficit noted. PSYCHIATRIC: Normal mood and affect. Normal behavior. Normal judgment and thought content. CARDIOVASCULAR: Normal heart rate noted RESPIRATORY: Effort and breath sounds normal, no problems with respiration noted ABDOMEN: No masses noted. No other overt distention noted.   PELVIC: Deferred  Labs and Imaging US PELVIC COMPLETE WITH TRANSVAGINAL  Result Date: 05/14/2019 CLINICAL DATA:  RIGHT adnexal pain; LMP third week of February EXAM: TRANSABDOMINAL AND TRANSVAGINAL ULTRASOUND OF PELVIS TECHNIQUE: Both transabdominal and transvaginal ultrasound examinations of the pelvis were performed. Transabdominal technique was performed for global imaging of the pelvis including uterus, ovaries, adnexal regions, and pelvic cul-de-sac. It was necessary to proceed with endovaginal exam following the transabdominal exam to visualize the endometrium and LEFT ovary. COMPARISON:  None FINDINGS: Uterus Measurements: 11.0 x 4.3 x 5.5 cm = volume: 136 mL. Retroverted. Slightly heterogeneous myometrium without focal mass Endometrium Thickness: 11 mm.  No endometrial fluid or focal abnormality Right ovary Measurements: 2.2 x 2.4 x 2.3 cm = volume: 6.3 mL. Normal morphology without mass Left ovary Measurements: 3.4 x 1.8 x 2.1 cm = volume: 7.0 mL. Normal morphology without mass Other findings Small amount of free pelvic fluid.  No adnexal masses. IMPRESSION: Normal exam. Electronically Signed   By: Lavonia Dana M.D.   On: 05/14/2019 16:37      Assessment and Plan:      1. Chronic female pelvic pain Ultrasound findings reviewed. Discussed pelvic pain in detail, possibility of endometriosis  given association to menstrual cycles. Emphasized that the only way to diagnose it would be via laparoscopy, but we can presumptively treat.  Recommended OCP, especially continuous OCPs.  Discussed that this  would also help with her PMDD.   She was told that if her pain persisted, laparoscopy may be the next step, or will need to consider other pelvic organs as the etiology of her pain (currently denies any GI or GU symptoms associated with this pain). - levonorgestrel-ethinyl estradiol (SEASONALE) 0.15-0.03 MG tablet; Take 1 tablet by mouth daily.  Dispense: 1 Package; Refill: 4  2. PMDD (premenstrual dysphoric disorder) OCPs prescribed, will monitor response. - levonorgestrel-ethinyl estradiol (SEASONALE) 0.15-0.03 MG tablet; Take 1 tablet by mouth daily.  Dispense: 1 Package; Refill: 4  3. Encounter for prescription of female contraceptives Encouraged use of condoms for STI prevention. - levonorgestrel-ethinyl estradiol (SEASONALE) 0.15-0.03 MG tablet; Take 1 tablet by mouth daily.  Dispense: 1 Package; Refill: 4  4. Human papilloma virus (HPV) type 9 vaccine administered Counseled about HPV vaccine series, she wants to start this today.  - HPV 9-valent vaccine,Recombinat   Routine preventative health maintenance measures emphasized. Please refer to After Visit Summary for other counseling recommendations.   Return in about 2 months (around 09/18/2019) for Follow up pelvic pain and HPV Vaccine #2.    Total face-to-face time with patient: 20 minutes.  Over 50% of encounter was spent on counseling and coordination of care.   Jaynie Collins, MD, FACOG Obstetrician & Gynecologist, [facility] for [employer], [facility]

## 2019-07-19 NOTE — Patient Instructions (Signed)
HPV (Human Papillomavirus) Vaccine: What You Need to Know 1. Why get vaccinated? HPV (Human papillomavirus) vaccine can prevent infection with some types of human papillomavirus. HPV infections can cause certain types of cancers including:  cervical, vaginal and vulvar cancers in women,  penile cancer in men, and  anal cancers in both men and women. HPV vaccine prevents infection from the HPV types that cause over 90% of these cancers. HPV is spread through intimate skin-to-skin or sexual contact. HPV infections are so common that nearly all men and women will get at least one type of HPV at some time in their lives. Most HPV infections go away by themselves within 2 years. But sometimes HPV infections will last longer and can cause cancers later in life. 2. HPV vaccine HPV vaccine is routinely recommended for adolescents at 76 or 36 years of age to ensure they are protected before they are exposed to the virus. HPV vaccine may be given beginning at age 31 years, and as late as age 17 years. Most people older than 26 years will not benefit from HPV vaccination. Talk with your health care provider if you want more information. Most children who get the first dose before 46 years of age need 2 doses of HPV vaccine. Anyone who gets the first dose on or after 36 years of age, and younger people with certain immunocompromising conditions, need 3 doses. Your health care provider can give you more information. HPV vaccine may be given at the same time as other vaccines. 3. Talk with your health care provider Tell your vaccine provider if the person getting the vaccine:  Has had an allergic reaction after a previous dose of HPV vaccine, or has any severe, life-threatening allergies.  Is pregnant. In some cases, your health care provider may decide to postpone HPV vaccination to a future visit. People with minor illnesses, such as a cold, may be vaccinated. People who are moderately or severely ill  should usually wait until they recover before getting HPV vaccine. Your health care provider can give you more information. 4. Risks of a vaccine reaction  Soreness, redness, or swelling where the shot is given can happen after HPV vaccine.  Fever or headache can happen after HPV vaccine. People sometimes faint after medical procedures, including vaccination. Tell your provider if you feel dizzy or have vision changes or ringing in the ears. As with any medicine, there is a very remote chance of a vaccine causing a severe allergic reaction, other serious injury, or death. 5. What if there is a serious problem? An allergic reaction could occur after the vaccinated person leaves the clinic. If you see signs of a severe allergic reaction (hives, swelling of the face and throat, difficulty breathing, a fast heartbeat, dizziness, or weakness), call 9-1-1 and get the person to the nearest hospital. For other signs that concern you, call your health care provider. Adverse reactions should be reported to the Vaccine Adverse Event Reporting System (VAERS). Your health care provider will usually file this report, or you can do it yourself. Visit the VAERS website at www.vaers.SamedayNews.es or call 959-256-4543. VAERS is only for reporting reactions, and VAERS staff do not give medical advice. 6. The National Vaccine Injury Compensation Program The [employer] Vaccine Injury Compensation Program (VICP) is a federal program that was created to compensate people who may have been injured by certain vaccines. Visit the VICP website at GoldCloset.com.ee or call 973 792 1440 to learn about the program and about filing a claim. There  is a time limit to file a claim for compensation. 7. How can I learn more?  Ask your health care provider.  Call your local or state health department.  Contact the Centers for Disease Control and Prevention (CDC): ? Call 478-888-4999 (1-800-CDC-INFO) or ? Visit CDC's  website at http://hunter.com/ Vaccine Information Statement HPV Vaccine (12/24/2017) This information is not intended to replace advice given to you by your health care provider. Make sure you discuss any questions you have with your health care provider. Document Revised: 06/02/2018 Document Reviewed: 09/23/2017 Elsevier Patient Education  [employer].   Endometriosis  Endometriosis is a condition in which the tissue that lines the uterus (endometrium) grows outside of its normal location. The tissue may grow in many locations close to the uterus, but it commonly grows on the ovaries, fallopian tubes, vagina, or bowel. When the uterus sheds the endometrium every menstrual cycle, there is bleeding wherever the endometrial tissue is located. This can cause pain because blood is irritating to tissues that are not normally exposed to it. What are the causes? The cause of endometriosis is not known. What increases the risk? You may be more likely to develop endometriosis if you:  Have a family history of endometriosis.  Have never given birth.  Started your period at age 1 or younger.  Have high levels of estrogen in your body.  Were exposed to a certain medicine (diethylstilbestrol) before you were born (in utero).  Had low birth weight.  Were born as a twin, triplet, or other multiple.  Have a BMI of less than 25. BMI is an estimate of body fat and is calculated from height and weight. What are the signs or symptoms? Often, there are no symptoms of this condition. If you do have symptoms, they may:  Vary depending on where your endometrial tissue is growing.  Occur during your menstrual period (most common) or midcycle.  Come and go, or you may go months with no symptoms at all.  Stop with menopause. Symptoms may include:  Pain in the back or abdomen.  Heavier bleeding during periods.  Pain during sex.  Painful bowel movements.  Infertility.  Pelvic  pain.  Bleeding more than once a month. How is this diagnosed? This condition is diagnosed based on your symptoms and a physical exam. You may have tests, such as:  Blood tests and urine tests. These may be done to help rule out other possible causes of your symptoms.  Ultrasound, to look for abnormal tissues.  An X-ray of the lower bowel (barium enema).  An ultrasound that is done through the vagina (transvaginally).  CT scan.  MRI.  Laparoscopy. In this procedure, a lighted, pencil-sized instrument called a laparoscope is inserted into your abdomen through an incision. The laparoscope allows your health care provider to look at the organs inside your body and check for abnormal tissue to confirm the diagnosis. If abnormal tissue is found, your health care provider may remove a small piece of tissue (biopsy) to be examined under a microscope. How is this treated? Treatment for this condition may include:  Medicines to relieve pain, such as NSAIDs.  Hormone therapy. This involves using artificial (synthetic) hormones to reduce endometrial tissue growth. Your health care provider may recommend using a hormonal form of birth control, or other medicines.  Surgery. This may be done to remove abnormal endometrial tissue. ? In some cases, tissue may be removed using a laparoscope and a laser (laparoscopic laser treatment). ? In  severe cases, surgery may be done to remove the fallopian tubes, uterus, and ovaries (hysterectomy). Follow these instructions at home:  Take over-the-counter and prescription medicines only as told by your health care provider.  Do not drive or use heavy machinery while taking prescription pain medicine.  Try to avoid activities that cause pain, including sexual activity.  Keep all follow-up visits as told by your health care provider. This is important. Contact a health care provider if:  You have pain in the area between your hip bones (pelvic area) that  occurs: ? Before, during, or after your period. ? In between your period and gets worse during your period. ? During or after sex. ? With bowel movements or urination, especially during your period.  You have problems getting pregnant.  You have a fever. Get help right away if:  You have severe pain that does not get better with medicine.  You have severe nausea and vomiting, or you cannot eat without vomiting.  You have pain that affects only the lower, right side of your abdomen.  You have abdominal pain that gets worse.  You have abdominal swelling.  You have blood in your stool. This information is not intended to replace advice given to you by your health care provider. Make sure you discuss any questions you have with your health care provider. Document Revised: 01/24/2017 Document Reviewed: 07/15/2015 Elsevier Patient Education  2020 [employer].

## 2019-07-22 ENCOUNTER — Ambulatory Visit (INDEPENDENT_AMBULATORY_CARE_PROVIDER_SITE_OTHER): Payer: Federal, State, Local not specified - PPO | Admitting: Psychology

## 2019-07-22 DIAGNOSIS — F331 Major depressive disorder, recurrent, moderate: Secondary | ICD-10-CM | POA: Diagnosis not present

## 2019-07-28 ENCOUNTER — Telehealth: Payer: Federal, State, Local not specified - PPO | Admitting: Medical

## 2019-07-28 ENCOUNTER — Other Ambulatory Visit: Payer: Self-pay

## 2019-07-28 ENCOUNTER — Telehealth: Payer: Self-pay | Admitting: Medical

## 2019-07-28 ENCOUNTER — Encounter: Payer: Self-pay | Admitting: Medical

## 2019-07-28 VITALS — Ht 64.0 in | Wt 190.0 lb

## 2019-07-28 DIAGNOSIS — R6883 Chills (without fever): Secondary | ICD-10-CM | POA: Diagnosis not present

## 2019-07-28 DIAGNOSIS — R102 Pelvic and perineal pain: Secondary | ICD-10-CM

## 2019-07-28 DIAGNOSIS — R52 Pain, unspecified: Secondary | ICD-10-CM

## 2019-07-28 DIAGNOSIS — G8929 Other chronic pain: Secondary | ICD-10-CM

## 2019-07-28 DIAGNOSIS — R110 Nausea: Secondary | ICD-10-CM | POA: Diagnosis not present

## 2019-07-28 MED ORDER — ONDANSETRON HCL 4 MG PO TABS: 4.0000 mg | ORAL_TABLET | Freq: Three times a day (TID) | ORAL | 0 refills | Status: DC | PRN

## 2019-07-28 NOTE — Progress Notes (Signed)
Subjective:     Patient ID: Krista [name], female   DOB: 12/16/1985, 33 y.o.   MRN: [id number]  This visit type was conducted due to national recommendations for restrictions regarding the COVID-19 Pandemic (e.g. social distancing) in an effort to limit this patient's exposure and mitigate transmission in our community.  Due to their co-morbid illnesses, this patient is at least at moderate risk for complications without adequate follow up.  This format is felt to be most appropriate for this patient at this time.    Documentation for virtual audio and video telecommunications through Zoom encounter:  The patient was located at home. The provider was located in the office. The patient did consent to this visit and is aware of possible charges through their insurance for this visit.  The other persons participating in this telemedicine service were none. Time spent on call was 20 minutes and in review of previous records 20 minutes total.  This virtual service is not related to other E/M service within previous 7 days.   HPI Chief Complaint  Patient presents with  . Sore Throat    vomiting 1x and sore corner of mouth    Virtual consult today for vomiting, sore throat.  She notes a week ago she had chills sweats and did not feel well.  Those symptoms calmed down a bit.  But then in the last 2 days she has had nausea, one episode of vomiting.  Occasional cough.  She has pelvic pain but this is also chronic.  She saw gynecology recently for evaluation of pelvic pain, had ultrasound, was started on birth control.  She denies urinary symptoms, no burning with urination no polyuria.  No diarrhea.  No upper abdominal pain.  No back pain.  No concern for STD, no vaginal discharge, no new sexual partners.  No sick contacts, no Covid contacts.  No tick exposure.  No fever.  Denies sneezing, congestion, shortness of breath, no additional vomiting.  She had a negative Covid test last week.  No  prior Covid vaccine.  [place] all [place].  Review of Systems As in subjective    Objective:   Physical Exam Due to coronavirus pandemic stay at home measures, patient visit was virtual and they were not examined in person.   Ht 5\' 4"  (1.626 m)   Wt 190 lb (86.2 kg)   LMP 07/19/2019   BMI 32.61 kg/m      Assessment:     Encounter Diagnoses  Name Primary?  . Nausea Yes  . Chills   . Body aches   . Chronic pelvic pain in female        Plan:     We discussed that her symptoms are somewhat vague and nonspecific.  We discussed limitations of virtual consult.  She will begin Zofran for nausea  I reviewed her recent gynecology notes, limited testing done in their notes suggest that she could have endometriosis as a cause of her chronic pelvic pain.  She was started on hormonal contraception.  I encouraged her to possibly get a second repeat Covid test.  At this point advise rest, hydration, Zofran as needed.  If symptoms change, worsen, or if not improving over the next few days call back.  I asked her to give me a call report in 2 days either way  If not improving, bring her in for in person eval and exam  Krista [name] was seen today for  sore throat.  Diagnoses and all orders for this visit:  Nausea  Chills  Body aches  Chronic pelvic pain in female  Other orders -     ondansetron (ZOFRAN) 4 MG tablet; Take 1 tablet (4 mg total) by mouth every 8 (eight) hours as needed for nausea or vomiting.

## 2019-07-28 NOTE — Telephone Encounter (Signed)
Have her take a picture of the corners of her mouth that she was describing and sent through my chart or email  [email or web address]

## 2019-07-28 NOTE — Telephone Encounter (Signed)
Sending via mychart

## 2019-07-29 ENCOUNTER — Ambulatory Visit (INDEPENDENT_AMBULATORY_CARE_PROVIDER_SITE_OTHER): Payer: Federal, State, Local not specified - PPO | Admitting: Psychology

## 2019-07-29 DIAGNOSIS — F331 Major depressive disorder, recurrent, moderate: Secondary | ICD-10-CM | POA: Diagnosis not present

## 2019-07-31 ENCOUNTER — Other Ambulatory Visit: Payer: Self-pay

## 2019-07-31 ENCOUNTER — Encounter (HOSPITAL_COMMUNITY): Payer: Self-pay | Admitting: Emergency Medicine

## 2019-07-31 ENCOUNTER — Emergency Department (HOSPITAL_COMMUNITY)
Admission: EM | Admit: 2019-07-31 | Discharge: 2019-08-01 | Disposition: A | Discharge status: Home / Self Care | Payer: Federal, State, Local not specified - PPO | Attending: Emergency Medicine | Admitting: Emergency Medicine

## 2019-07-31 DIAGNOSIS — R102 Pelvic and perineal pain: Secondary | ICD-10-CM | POA: Insufficient documentation

## 2019-07-31 DIAGNOSIS — R0789 Other chest pain: Secondary | ICD-10-CM | POA: Diagnosis not present

## 2019-07-31 DIAGNOSIS — R1011 Right upper quadrant pain: Secondary | ICD-10-CM | POA: Diagnosis not present

## 2019-07-31 DIAGNOSIS — Z79899 Other long term (current) drug therapy: Secondary | ICD-10-CM | POA: Diagnosis not present

## 2019-07-31 DIAGNOSIS — R079 Chest pain, unspecified: Secondary | ICD-10-CM | POA: Insufficient documentation

## 2019-07-31 DIAGNOSIS — R109 Unspecified abdominal pain: Secondary | ICD-10-CM | POA: Diagnosis not present

## 2019-07-31 DIAGNOSIS — K2900 Acute gastritis without bleeding: Secondary | ICD-10-CM | POA: Diagnosis not present

## 2019-07-31 DIAGNOSIS — R1013 Epigastric pain: Secondary | ICD-10-CM | POA: Diagnosis not present

## 2019-07-31 HISTORY — DX: Anxiety disorder, unspecified: F41.9

## 2019-07-31 HISTORY — DX: Depression, unspecified: F32.A

## 2019-07-31 HISTORY — DX: Unspecified asthma, uncomplicated: J45.909

## 2019-07-31 LAB — LIPASE, BLOOD: Lipase: 31 U/L (ref 11–51)

## 2019-07-31 LAB — COMPREHENSIVE METABOLIC PANEL
ALT: 12 U/L (ref 0–44)
AST: 13 U/L — ABNORMAL LOW (ref 15–41)
Albumin: 3.5 g/dL (ref 3.5–5.0)
Alkaline Phosphatase: 57 U/L (ref 38–126)
Anion gap: 9 (ref 5–15)
BUN: 12 mg/dL (ref 6–20)
CO2: 25 mmol/L (ref 22–32)
Calcium: 9.2 mg/dL (ref 8.9–10.3)
Chloride: 107 mmol/L (ref 98–111)
Creatinine, Ser: 1.12 mg/dL — ABNORMAL HIGH (ref 0.44–1.00)
GFR calc Af Amer: >60 mL/min (ref >60)
GFR calc non Af Amer: >60 mL/min (ref >60)
Glucose, Bld: 128 mg/dL — ABNORMAL HIGH (ref 70–99)
Potassium: 3.8 mmol/L (ref 3.5–5.1)
Sodium: 141 mmol/L (ref 135–145)
Total Bilirubin: 0.1 mg/dL — ABNORMAL LOW (ref 0.3–1.2)
Total Protein: 6.8 g/dL (ref 6.5–8.1)

## 2019-07-31 LAB — URINALYSIS, ROUTINE W REFLEX MICROSCOPIC
Bilirubin Urine: NEGATIVE
Glucose, UA: NEGATIVE mg/dL
Hgb urine dipstick: NEGATIVE
Ketones, ur: NEGATIVE mg/dL
Leukocytes,Ua: NEGATIVE
Nitrite: NEGATIVE
Protein, ur: NEGATIVE mg/dL
Specific Gravity, Urine: 1.027 (ref 1.005–1.030)
pH: 6 (ref 5.0–8.0)

## 2019-07-31 LAB — CBC
HCT: 38.2 % (ref 36.0–46.0)
Hemoglobin: 12 g/dL (ref 12.0–15.0)
MCH: 32.1 pg (ref 26.0–34.0)
MCHC: 31.4 g/dL (ref 30.0–36.0)
MCV: 102.1 fL — ABNORMAL HIGH (ref 80.0–100.0)
Platelets: 268 10*3/uL (ref 150–400)
RBC: 3.74 MIL/uL — ABNORMAL LOW (ref 3.87–5.11)
RDW: 12.2 % (ref 11.5–15.5)
WBC: 9.6 10*3/uL (ref 4.0–10.5)
nRBC: 0 % (ref 0.0–0.2)

## 2019-07-31 LAB — TROPONIN I (HIGH SENSITIVITY)
Troponin I (High Sensitivity): <2 ng/L (ref <18)
Troponin I (High Sensitivity): <2 ng/L (ref <18)

## 2019-07-31 LAB — I-STAT BETA HCG BLOOD, ED (MC, WL, AP ONLY): I-stat hCG, quantitative: <5 m[IU]/mL (ref <5)

## 2019-07-31 MED ORDER — SODIUM CHLORIDE 0.9% FLUSH: 3.0000 mL | Freq: Once | INTRAVENOUS | Status: DC

## 2019-07-31 NOTE — ED Triage Notes (Signed)
C/o R sided chest and abd pain x 2 weeks with nausea and vomiting.  States it started with chills 2 weeks ago and she had a negative COVID test.

## 2019-07-31 NOTE — ED Provider Notes (Signed)
[facility] EMERGENCY DEPARTMENT Provider Note   CSN: [id number] Arrival date & time: 07/31/19  1830     History Chief Complaint  Patient presents with  . Abdominal Pain  . Chest Pain    Krista [name] is a 34 y.o. female with a hx of anemia, anxiety, depression presents to the Emergency Department complaining of gradual, persistent, progressively worsening epigastric abd pain and chest pain onset 3 weeks ago.  Pt reports the pain was intermittent, but has become more constant in the last week.  Pt reports pain is exacerbated by eating and drinking, nothing makes it better.  Pt reports last EtOH was 2 mos ago.  Pt denies ASA and NSAID usage.  Previous abd surgeries include cyst removal from her kidney.  Pt reports associated vomiting after eating and radiation of pain into the right shoulder. No pain with inspiration.  Pt reports a feeling of chills, but no fevers.  Denies headache, neck pain, SOB, weakness, dizziness, syncope.  Pt reports she initially thought she had COVID, but had a neg COVID test on 5/23.  No treatments for pain.     The history is provided by the patient and medical records. No language interpreter was used.       Past Medical History:  Diagnosis Date  . Anemia    History of  . Anxiety   . Asthma   . Cyst of right kidney   . Depression     Patient Active Problem List   Diagnosis Date Noted  . Chronic female pelvic pain 07/19/2019  . PMDD (premenstrual dysphoric disorder) 07/19/2019  . Snoring 06/15/2019  . Daytime somnolence 06/15/2019  . Seafood allergy 06/15/2019  . Fatigue 03/22/2019  . History of anemia 03/22/2019  . Anxiety and depression 03/22/2019  . Vitamin D deficiency 03/22/2019  . SAB (spontaneous abortion) 08/12/2017  . 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 DILATION AND EVACUATION N/A 02/21/2016   Procedure: DILATATION AND EVACUATION;  Surgeon: Levie Heritage,  DO;  Location: [facility];  Service: Gynecology;  Laterality: N/A;  . IUD REMOVAL  11/2016  . NO PAST SURGERIES    . ROBOTIC ASSITED PARTIAL NEPHRECTOMY Right 02/07/2017   Procedure: XI ROBOTIC ASSITED PARTIAL NEPHRECTOMY;  Surgeon: Sebastian Ache, MD;  Location: [facility];  Service: Urology;  Laterality: Right;     OB History    Gravida  5   Para  2   Term  2   Preterm      AB  3   Living  2     SAB  2   TAB  1   Ectopic      Multiple      Live Births  2           Family History  Problem Relation Age of Onset  . Asthma Sister     Social History   Tobacco Use  . Smoking status: Never Smoker  . Smokeless tobacco: Never Used  Substance Use Topics  . Alcohol use: Yes    Comment: socially   . Drug use: No    Home Medications Prior to Admission medications   Medication Sig Start Date End Date Taking? Authorizing Provider  budesonide-formoterol (SYMBICORT) 160-4.5 MCG/ACT inhaler Inhale 2 puffs into the lungs 2 (two) times daily.  03/22/19  Yes [provider]  buPROPion (WELLBUTRIN XL) 150 MG 24 hr tablet Take 150 mg by mouth 3 (  three) times daily.  07/30/17  Yes [provider]  cetirizine (ZYRTEC) 10 MG tablet Take 10 mg by mouth daily as needed for allergies or rhinitis.  06/07/19  Yes [provider]  cholecalciferol (VITAMIN D3) 25 MCG (1000 UNIT) tablet Take 2 tablets (2,000 Units total) by mouth daily. 06/16/19  Yes Tysinger, Kermit Baloavid S, PA-C  clindamycin-benzoyl peroxide (BENZACLIN) gel Apply 1 application topically every morning. 02/22/19  Yes [provider]  EPINEPHrine 0.3 mg/0.3 mL IJ SOAJ injection Inject 0.3 mg into the muscle as needed for anaphylaxis.  11/04/18  Yes [provider]  escitalopram (LEXAPRO) 10 MG tablet Take 10 mg by mouth daily.   Yes [provider]  levonorgestrel-ethinyl estradiol (SEASONALE) 0.15-0.03 MG tablet Take 1 tablet by mouth daily. 07/19/19  Yes Anyanwu, Jethro BastosUgonna A, MD    mometasone (NASONEX) 50 MCG/ACT nasal spray Place 2 sprays into the nose daily.  12/29/18  Yes [provider]  Prenatal Multivit-Min-Fe-FA (PRENATAL VITAMINS) 0.8 MG tablet Take 1 tablet by mouth daily. 03/03/18  Yes Gerrit HeckEmly, Jessica, CNM  tretinoin (RETIN-A) 0.05 % cream Apply 1 application topically at bedtime.  07/09/18  Yes [provider]  ondansetron (ZOFRAN) 4 MG tablet Take 1 tablet (4 mg total) by mouth every 8 (eight) hours as needed for nausea or vomiting. 07/28/19   Tysinger, Kermit Baloavid S, PA-C  pantoprazole (PROTONIX) 20 MG tablet Take 1 tablet (20 mg total) by mouth daily. 08/01/19   Dillard Pascal, Dahlia ClientHannah, PA-C  sucralfate (CARAFATE) 1 g tablet Take 1 tablet (1 g total) by mouth 4 (four) times daily -  with meals and at bedtime. 08/01/19   Sharen Youngren, Dahlia ClientHannah, PA-C    Allergies    Shellfish allergy  Review of Systems   Review of Systems  Constitutional: Negative for appetite change, diaphoresis, fatigue, fever and unexpected weight change.  HENT: Negative for mouth sores.   Eyes: Negative for visual disturbance.  Respiratory: Negative for cough, chest tightness, shortness of breath and wheezing.   Cardiovascular: Positive for chest pain.  Gastrointestinal: Positive for abdominal pain, nausea and vomiting. Negative for constipation and diarrhea.  Endocrine: Negative for polydipsia, polyphagia and polyuria.  Genitourinary: Negative for dysuria, frequency, hematuria and urgency.  Musculoskeletal: Negative for arthralgias, back pain and neck stiffness.  Skin: Negative for rash.  Allergic/Immunologic: Negative for immunocompromised state.  Neurological: Negative for syncope, light-headedness and headaches.  Hematological: Does not bruise/bleed easily.  Psychiatric/Behavioral: Negative for sleep disturbance. The patient is not nervous/anxious.     Physical Exam Updated Vital Signs BP 113/67   Pulse 72   Temp 98.3 F (36.8 C)   Resp 17   Ht 5\' 4"  (1.626 m)   Wt 86.2 kg    LMP 07/19/2019   SpO2 98%   BMI 32.61 kg/m   Physical Exam Vitals and nursing note reviewed.  Constitutional:      General: She is not in acute distress.    Appearance: She is not diaphoretic.  HENT:     Head: Normocephalic.  Eyes:     General: No scleral icterus.    Conjunctiva/sclera: Conjunctivae normal.  Cardiovascular:     Rate and Rhythm: Normal rate and regular rhythm.     Pulses: Normal pulses.          Radial pulses are 2+ on the right side and 2+ on the left side.  Pulmonary:     Effort: No tachypnea, accessory muscle usage, prolonged expiration, respiratory distress or retractions.     Breath sounds:  No stridor.     Comments: Equal chest rise. No increased work of breathing. Abdominal:     General: There is no distension.     Palpations: Abdomen is soft.     Tenderness: There is abdominal tenderness in the right upper quadrant. There is guarding. There is no rebound. Positive signs include Murphy's sign.  Musculoskeletal:     Cervical back: Normal range of motion.     Comments: Moves all extremities equally and without difficulty.  Skin:    General: Skin is warm and dry.     Capillary Refill: Capillary refill takes less than 2 seconds.  Neurological:     Mental Status: She is alert.     GCS: GCS eye subscore is 4. GCS verbal subscore is 5. GCS motor subscore is 6.     Comments: Speech is clear and goal oriented.  Psychiatric:        Mood and Affect: Mood normal.     ED Results / Procedures / Treatments   Labs (all labs ordered are listed, but only abnormal results are displayed) Labs Reviewed  COMPREHENSIVE METABOLIC PANEL - Abnormal; Notable for the following components:      Result Value   Glucose, Bld 128 (*)    Creatinine, Ser 1.12 (*)    AST 13 (*)    Total Bilirubin 0.1 (*)    All other components within normal limits  CBC - Abnormal; Notable for the following components:   RBC 3.74 (*)    MCV 102.1 (*)    All other components within normal  limits  LIPASE, BLOOD  URINALYSIS, ROUTINE W REFLEX MICROSCOPIC  I-STAT BETA HCG BLOOD, ED ([facility], [facility], [facility] ONLY)  TROPONIN I (HIGH SENSITIVITY)  TROPONIN I (HIGH SENSITIVITY)    EKG EKG Interpretation  Date/Time:  Saturday July 31 2019 19:53:23 EDT Ventricular Rate:  80 PR Interval:  142 QRS Duration: 80 QT Interval:  362 QTC Calculation: 417 R Axis:   54 Text Interpretation: Normal sinus rhythm Normal ECG When compared with ECG of 06/19/2013, No significant change was found Confirmed by Dione Booze ([id number]) on 07/31/2019 11:18:16 PM   Radiology US Abdomen Limited  Result Date: 08/01/2019 CLINICAL DATA:  Right upper quadrant abdominal pain. Nausea and vomiting. EXAM: ULTRASOUND ABDOMEN LIMITED RIGHT UPPER QUADRANT COMPARISON:  Abdominal ultrasound 10/30/2017 FINDINGS: Gallbladder: No gallstones or wall thickening visualized. No sonographic Murphy sign noted by sonographer. Common bile duct: Diameter: 3 mm, normal. Liver: A subcentimeter echogenic focus on prior exam is not demonstrated on the current exam. Within normal limits in parenchymal echogenicity. Portal vein is patent on color Doppler imaging with normal direction of blood flow towards the liver. Other: No right upper quadrant free fluid. IMPRESSION: Unremarkable right upper quadrant ultrasound.  No gallstones. Electronically Signed   By: Narda Rutherford M.D.   On: 08/01/2019 01:16   CT Renal Stone Study  Result Date: 08/01/2019 CLINICAL DATA:  Right flank pain.  History of stones. EXAM: CT ABDOMEN AND PELVIS WITHOUT CONTRAST TECHNIQUE: Multidetector CT imaging of the abdomen and pelvis was performed following the standard protocol without IV contrast. COMPARISON:  Abdominal ultrasound earlier this day. Abdominopelvic CT 11/26/2016 FINDINGS: Lower chest: The lung bases are clear. Hepatobiliary: Subcentimeter subcapsular low-density lesion in the right lobe of the liver, incompletely characterized on noncontrast exam. Gallbladder  physiologically distended, no calcified stone. No biliary dilatation. Pancreas: No ductal dilatation or inflammation. Spleen: Normal in size without focal abnormality. Adrenals/Urinary Tract: No adrenal nodule. Scarring and postsurgical  change in the right kidney. No hydronephrosis. A 6 mm calcification in the posterior mid right kidney appears parenchymal rather than nonobstructing stone. Left kidney is unremarkable. No ureteral calculi. Urinary bladder is nondistended. No bladder stone. No bladder wall thickening. Stomach/Bowel: Nondistended stomach. No small bowel obstruction or inflammation. Normal appendix. Moderate stool burden in the colon. No colonic wall thickening. Vascular/Lymphatic: Normal caliber abdominal aorta. No abdominopelvic adenopathy. Reproductive: Retroverted uterus with questionable fibroids. No adnexal mass. Other: Tiny fat containing umbilical hernia. Free air or free fluid. Musculoskeletal: There are no acute or suspicious osseous abnormalities. IMPRESSION: 1. No hydronephrosis or obstructive uropathy. A 6 mm calcification in the posterior mid right kidney appears parenchymal rather than nonobstructing stone. 2. Scarring and postsurgical change in the right kidney. 3. No acute findings or explanation for right-sided pain. Normal appendix. Electronically Signed   By: Keith Rake M.D.   On: 08/01/2019 02:35    Procedures Procedures (including critical care time)  Medications Ordered in ED Medications  sodium chloride flush (NS) 0.9 % injection 3 mL (has no administration in time range)  sodium chloride 0.9 % bolus 1,000 mL (0 mLs Intravenous Stopped 08/01/19 0229)  morphine 4 MG/ML injection 4 mg (4 mg Intravenous Given 08/01/19 0047)  ondansetron (ZOFRAN) injection 4 mg (4 mg Intravenous Given 08/01/19 0047)  pantoprazole (PROTONIX) injection 40 mg (40 mg Intravenous Given 08/01/19 0246)  alum & mag hydroxide-simeth (MAALOX/MYLANTA) 200-200-20 MG/5ML suspension 30 mL (30 mLs Oral  Given 08/01/19 0246)    ED Course  I have reviewed the triage vital signs and the nursing notes.  Pertinent labs & imaging results that were available during my care of the patient were reviewed by me and considered in my medical decision making (see chart for details).  Clinical Course as of Aug 01 714  Sun Aug 01, 2019  0113 Elevated serum creatinine noted.  Unclear if this is secondary to persistent vomiting.  Fluids given.  Creatinine(!): 1.12 [HM]  0115 No evidence of urinary tract infection.  Ketones, ur: NEGATIVE [HM]    Clinical Course User Index [HM] Ravyn Nikkel, Gwenlyn Perking   MDM Rules/Calculators/A&P                       Patient presents with right upper quadrant abdominal pain worse after eating that radiates into her right flank and right shoulder.  Associated nausea and vomiting.  Positive Murphy sign.  Labs are reassuring.  No evidence of sepsis.  No elevation in AST/ALT or lipase.  Ultrasound ordered to assess for possible cholelithiasis, cholecystitis or choledocholithiasis.  Ultrasound without evidence of cholelithiasis, cholecystitis or choledocholithiasis.  Given pain radiation to the back and flank CT scan was obtained.  No hydronephrosis or evidence of pyelonephritis.  No nephrolithiasis.  Suspect most likely gastritis versus peptic ulcer disease.  Will treat for same and refer to gastroenterology.  Patient well-appearing and has been able to tolerate p.o. without difficulty.  Discussed reasons to return to the emergency department.  Patient states understanding and is in agreement with the plan.   Final Clinical Impression(s) / ED Diagnoses Final diagnoses:  Epigastric pain  Acute gastritis without hemorrhage, unspecified gastritis type    Rx / DC Orders ED Discharge Orders         Ordered    pantoprazole (PROTONIX) 20 MG tablet  Daily     08/01/19 0343    sucralfate (CARAFATE) 1 g tablet  3 times daily with meals & bedtime  08/01/19 0343             Breionna Punt, Dahlia Client, PA-C 08/01/19 0719    Dione Booze, MD 08/01/19 [phone]

## 2019-08-01 ENCOUNTER — Emergency Department (HOSPITAL_COMMUNITY): Payer: Federal, State, Local not specified - PPO

## 2019-08-01 DIAGNOSIS — R1011 Right upper quadrant pain: Secondary | ICD-10-CM | POA: Diagnosis not present

## 2019-08-01 DIAGNOSIS — R109 Unspecified abdominal pain: Secondary | ICD-10-CM | POA: Diagnosis not present

## 2019-08-01 MED ORDER — ALUM & MAG HYDROXIDE-SIMETH 200-200-20 MG/5ML PO SUSP
30.0000 mL | Freq: Once | ORAL | Status: AC
  Administered 2019-08-01: 30 mL via ORAL
  Filled 2019-08-01: qty 30

## 2019-08-01 MED ORDER — ONDANSETRON HCL 4 MG/2ML IJ SOLN
4.0000 mg | Freq: Once | INTRAMUSCULAR | Status: AC
  Administered 2019-08-01: 4 mg via INTRAVENOUS
  Filled 2019-08-01: qty 2

## 2019-08-01 MED ORDER — PANTOPRAZOLE SODIUM 20 MG PO TBEC
20.0000 mg | DELAYED_RELEASE_TABLET | Freq: Every day | ORAL | 0 refills | Status: DC
Start: 2019-08-01 — End: 2019-10-28

## 2019-08-01 MED ORDER — PANTOPRAZOLE SODIUM 40 MG IV SOLR
40.0000 mg | Freq: Once | INTRAVENOUS | Status: AC
  Administered 2019-08-01: 40 mg via INTRAVENOUS
  Filled 2019-08-01: qty 40

## 2019-08-01 MED ORDER — SUCRALFATE 1 G PO TABS: 1.0000 g | ORAL_TABLET | Freq: Three times a day (TID) | ORAL | 0 refills | Status: DC

## 2019-08-01 MED ORDER — MORPHINE SULFATE (PF) 4 MG/ML IV SOLN
4.0000 mg | Freq: Once | INTRAVENOUS | Status: AC
  Administered 2019-08-01: 4 mg via INTRAVENOUS
  Filled 2019-08-01: qty 1

## 2019-08-01 MED ORDER — SODIUM CHLORIDE 0.9 % IV BOLUS
1000.0000 mL | Freq: Once | INTRAVENOUS | Status: AC
  Administered 2019-08-01: 1000 mL via INTRAVENOUS

## 2019-08-01 NOTE — ED Notes (Signed)
Pt verbalized understanding of d/c instructions, follow up care and s/s requiring return to ed. Pt had no further questions at this time and refused wheelchair. Pt ambulated to exit.  

## 2019-08-01 NOTE — ED Notes (Signed)
Pt to ct 

## 2019-08-01 NOTE — Discharge Instructions (Signed)
1. Medications: protonix, carafate, usual home medications 2. Treatment: rest, drink plenty of fluids, advance diet slowly 3. Follow Up: Please followup with your primary doctor in 2 days for discussion of your diagnoses and further evaluation after today's visit; if you do not have a primary care doctor use the resource guide provided to find one; Please return to the ER for persistent vomiting, high fevers or worsening symptoms

## 2019-08-02 ENCOUNTER — Telehealth: Payer: Self-pay

## 2019-08-02 NOTE — Telephone Encounter (Signed)
Patient is asking if she has fibroids. She had CT scan done over the weekend for abdominal pain and under the reproductive section on the report it states "questionable fibroids" so she is concerned. Please advise.

## 2019-08-03 ENCOUNTER — Encounter: Payer: Self-pay | Admitting: Medical

## 2019-08-04 ENCOUNTER — Encounter: Payer: Self-pay | Admitting: Physician Assistant

## 2019-08-05 ENCOUNTER — Ambulatory Visit (INDEPENDENT_AMBULATORY_CARE_PROVIDER_SITE_OTHER): Payer: Federal, State, Local not specified - PPO | Admitting: Psychology

## 2019-08-05 DIAGNOSIS — F330 Major depressive disorder, recurrent, mild: Secondary | ICD-10-CM

## 2019-08-08 DIAGNOSIS — Z131 Encounter for screening for diabetes mellitus: Secondary | ICD-10-CM | POA: Diagnosis not present

## 2019-08-12 ENCOUNTER — Ambulatory Visit (INDEPENDENT_AMBULATORY_CARE_PROVIDER_SITE_OTHER): Payer: Federal, State, Local not specified - PPO | Admitting: Psychology

## 2019-08-12 DIAGNOSIS — F331 Major depressive disorder, recurrent, moderate: Secondary | ICD-10-CM

## 2019-08-17 ENCOUNTER — Ambulatory Visit (INDEPENDENT_AMBULATORY_CARE_PROVIDER_SITE_OTHER): Payer: Federal, State, Local not specified - PPO | Admitting: Psychology

## 2019-08-17 DIAGNOSIS — F330 Major depressive disorder, recurrent, mild: Secondary | ICD-10-CM | POA: Diagnosis not present

## 2019-08-19 ENCOUNTER — Ambulatory Visit (INDEPENDENT_AMBULATORY_CARE_PROVIDER_SITE_OTHER): Payer: Federal, State, Local not specified - PPO | Admitting: Psychology

## 2019-08-19 DIAGNOSIS — F330 Major depressive disorder, recurrent, mild: Secondary | ICD-10-CM | POA: Diagnosis not present

## 2019-08-23 ENCOUNTER — Ambulatory Visit (INDEPENDENT_AMBULATORY_CARE_PROVIDER_SITE_OTHER): Payer: Federal, State, Local not specified - PPO | Admitting: Psychology

## 2019-08-23 DIAGNOSIS — F330 Major depressive disorder, recurrent, mild: Secondary | ICD-10-CM | POA: Diagnosis not present

## 2019-08-31 ENCOUNTER — Ambulatory Visit (INDEPENDENT_AMBULATORY_CARE_PROVIDER_SITE_OTHER): Payer: Federal, State, Local not specified - PPO | Admitting: Psychology

## 2019-08-31 DIAGNOSIS — F3342 Major depressive disorder, recurrent, in full remission: Secondary | ICD-10-CM

## 2019-09-01 DIAGNOSIS — F900 Attention-deficit hyperactivity disorder, predominantly inattentive type: Secondary | ICD-10-CM | POA: Diagnosis not present

## 2019-09-01 DIAGNOSIS — F339 Major depressive disorder, recurrent, unspecified: Secondary | ICD-10-CM | POA: Diagnosis not present

## 2019-09-01 DIAGNOSIS — F1010 Alcohol abuse, uncomplicated: Secondary | ICD-10-CM | POA: Diagnosis not present

## 2019-09-02 ENCOUNTER — Ambulatory Visit: Payer: Federal, State, Local not specified - PPO | Admitting: Physician Assistant

## 2019-09-13 ENCOUNTER — Ambulatory Visit (INDEPENDENT_AMBULATORY_CARE_PROVIDER_SITE_OTHER): Payer: Federal, State, Local not specified - PPO | Admitting: Psychology

## 2019-09-13 DIAGNOSIS — F3342 Major depressive disorder, recurrent, in full remission: Secondary | ICD-10-CM

## 2019-09-15 ENCOUNTER — Ambulatory Visit: Payer: Federal, State, Local not specified - PPO | Admitting: Psychology

## 2019-09-20 ENCOUNTER — Ambulatory Visit (INDEPENDENT_AMBULATORY_CARE_PROVIDER_SITE_OTHER): Payer: Federal, State, Local not specified - PPO | Admitting: Psychology

## 2019-09-20 ENCOUNTER — Ambulatory Visit: Payer: Federal, State, Local not specified - PPO | Admitting: Obstetrics and Gynecology

## 2019-09-20 DIAGNOSIS — F3342 Major depressive disorder, recurrent, in full remission: Secondary | ICD-10-CM

## 2019-09-27 ENCOUNTER — Ambulatory Visit (INDEPENDENT_AMBULATORY_CARE_PROVIDER_SITE_OTHER): Payer: Federal, State, Local not specified - PPO | Admitting: Psychology

## 2019-09-27 DIAGNOSIS — F3342 Major depressive disorder, recurrent, in full remission: Secondary | ICD-10-CM

## 2019-10-04 ENCOUNTER — Ambulatory Visit (INDEPENDENT_AMBULATORY_CARE_PROVIDER_SITE_OTHER): Payer: Federal, State, Local not specified - PPO | Admitting: Psychology

## 2019-10-04 DIAGNOSIS — F3342 Major depressive disorder, recurrent, in full remission: Secondary | ICD-10-CM

## 2019-10-11 ENCOUNTER — Ambulatory Visit (INDEPENDENT_AMBULATORY_CARE_PROVIDER_SITE_OTHER): Payer: Federal, State, Local not specified - PPO | Admitting: Psychology

## 2019-10-11 DIAGNOSIS — F3342 Major depressive disorder, recurrent, in full remission: Secondary | ICD-10-CM

## 2019-10-12 DIAGNOSIS — F1010 Alcohol abuse, uncomplicated: Secondary | ICD-10-CM | POA: Diagnosis not present

## 2019-10-12 DIAGNOSIS — F900 Attention-deficit hyperactivity disorder, predominantly inattentive type: Secondary | ICD-10-CM | POA: Diagnosis not present

## 2019-10-12 DIAGNOSIS — F339 Major depressive disorder, recurrent, unspecified: Secondary | ICD-10-CM | POA: Diagnosis not present

## 2019-10-18 ENCOUNTER — Ambulatory Visit (INDEPENDENT_AMBULATORY_CARE_PROVIDER_SITE_OTHER): Payer: Federal, State, Local not specified - PPO | Admitting: Psychology

## 2019-10-18 DIAGNOSIS — F3342 Major depressive disorder, recurrent, in full remission: Secondary | ICD-10-CM

## 2019-10-25 ENCOUNTER — Ambulatory Visit (INDEPENDENT_AMBULATORY_CARE_PROVIDER_SITE_OTHER): Payer: Federal, State, Local not specified - PPO | Admitting: Psychology

## 2019-10-25 DIAGNOSIS — F3342 Major depressive disorder, recurrent, in full remission: Secondary | ICD-10-CM

## 2019-10-28 ENCOUNTER — Other Ambulatory Visit: Payer: Self-pay

## 2019-10-28 ENCOUNTER — Encounter: Payer: Self-pay | Admitting: Family Medicine

## 2019-10-28 ENCOUNTER — Telehealth: Payer: Federal, State, Local not specified - PPO | Admitting: Family Medicine

## 2019-10-28 VITALS — Temp 98.4°F | Wt 185.0 lb

## 2019-10-28 DIAGNOSIS — R059 Cough, unspecified: Secondary | ICD-10-CM

## 2019-10-28 DIAGNOSIS — R6883 Chills (without fever): Secondary | ICD-10-CM

## 2019-10-28 DIAGNOSIS — J4530 Mild persistent asthma, uncomplicated: Secondary | ICD-10-CM

## 2019-10-28 DIAGNOSIS — R05 Cough: Secondary | ICD-10-CM

## 2019-10-28 DIAGNOSIS — Z20822 Contact with and (suspected) exposure to covid-19: Secondary | ICD-10-CM

## 2019-10-28 DIAGNOSIS — R52 Pain, unspecified: Secondary | ICD-10-CM

## 2019-10-28 LAB — POC COVID19 BINAXNOW: SARS Coronavirus 2 Ag: NEGATIVE

## 2019-10-28 LAB — POCT INFLUENZA A/B
Influenza A, POC: NEGATIVE
Influenza B, POC: NEGATIVE

## 2019-10-28 MED ORDER — BENZONATATE 200 MG PO CAPS: 200.0000 mg | ORAL_CAPSULE | Freq: Two times a day (BID) | ORAL | 0 refills | Status: DC | PRN

## 2019-10-28 MED ORDER — ALBUTEROL SULFATE HFA 108 (90 BASE) MCG/ACT IN AERS: 2.0000 | INHALATION_SPRAY | Freq: Four times a day (QID) | RESPIRATORY_TRACT | 0 refills | Status: DC | PRN

## 2019-10-28 NOTE — Progress Notes (Signed)
   Subjective:  Documentation for virtual audio and video telecommunications through [place] encounter. She also came to our office for a Covid test  The patient was located at home. 2 patient identifiers used.  The provider was located in the office. The patient did consent to this visit and is aware of possible charges through their insurance for this visit.  The other persons participating in this telemedicine service were none. Time spent on call was 16 minutes and in review of previous records 20 minutes total.  This virtual service is not related to other E/M service within previous 7 days.   Patient ID: Krista [name], female    DOB: Oct 13, 1985, 34 y.o.   MRN: [id number]  HPI Chief Complaint  Patient presents with  . possible covid    possible covid- rapid negative, chills, achy, sore throat, fatigue, pains in ear. sinus pressure x 3 days   Complains of a 3 day history of fatigue, chills, body aches, sinus pressure, sore throat and cough. These symptoms were preceded by right ear pain last week and then resolved.    Complains of worsening asthma with chest tightness and mild shortness of breath but has not need her albuterol inhaler. After I asked about her inhaler she realized she does not have one actually. She has been using Symbicort daily.   No fever, abdominal pain, N/V/D.   She did not get the Covid vaccine.   She took today and tomorrow off of work.      Review of Systems Pertinent positives and negatives in the history of present illness.     Objective:   Physical Exam Temp 98.4 F (36.9 C) (Oral)   Wt 185 lb (83.9 kg)   BMI 31.76 kg/m         Assessment & Plan:  Suspected 2019 novel coronavirus infection  Chills - Plan: POC COVID-19, Novel Coronavirus, NAA ([facility]), Influenza A/B, CANCELED: Influenza a and b  Body aches - Plan: POC COVID-19, Novel Coronavirus, NAA ([facility]), Influenza A/B, CANCELED: Influenza a and b  Cough - Plan: POC  COVID-19, Novel Coronavirus, NAA ([facility]), Influenza A/B, benzonatate (TESSALON) 200 MG capsule, CANCELED: Influenza a and b  Mild persistent asthma without complication - Plan: albuterol (VENTOLIN HFA) 108 (90 Base) MCG/ACT inhaler  Rapid Covid test negative in our office. PCR was sent as well.  Discussed that her symptoms are suspicious for Covid and that she should quarantine.  Discussed symptomatic treatment. Continue with Symbicort inhaler and I will prescribe albuterol for her. I also sent in Tessalon for cough. Recommend using Mucinex DM and hydrating. She will go to the ED or urgent care if she gets much worse over the long weekend. Follow up with me virtually next week. I will need to give her a work note from 10/28/19

## 2019-10-29 LAB — NOVEL CORONAVIRUS, NAA: SARS-CoV-2, NAA: NOT DETECTED

## 2019-10-30 ENCOUNTER — Encounter: Payer: Self-pay | Admitting: Family Medicine

## 2019-11-02 ENCOUNTER — Encounter: Payer: Self-pay | Admitting: Medical

## 2019-11-02 ENCOUNTER — Telehealth: Payer: Self-pay | Admitting: Medical

## 2019-11-02 NOTE — Telephone Encounter (Signed)
Done pt work note pt is going to do a virtual with shane next week state she has had breathing problems since she was a kid

## 2019-11-02 NOTE — Telephone Encounter (Signed)
I am ok with the out of work note for those dates since I saw her. The breathing issue needs to be addressed however. What does she mean by that? She will need a visit with someone or go to the urgent care.

## 2019-11-02 NOTE — Telephone Encounter (Signed)
Pt called and states that she needs a work note for Thursday 10/28/2019 to 11/01/2019 states she felt bad and didn't go to work, and she had to wait until her covid test come back before she went back to work,   Also pt wants to know about a breathing maching states she is still having a hard time breathing   She can be reached at [phone]

## 2019-11-08 ENCOUNTER — Telehealth: Payer: Self-pay | Admitting: Medical

## 2019-11-08 ENCOUNTER — Ambulatory Visit (INDEPENDENT_AMBULATORY_CARE_PROVIDER_SITE_OTHER): Payer: Federal, State, Local not specified - PPO | Admitting: Psychology

## 2019-11-08 DIAGNOSIS — F3342 Major depressive disorder, recurrent, in full remission: Secondary | ICD-10-CM

## 2019-11-08 NOTE — Telephone Encounter (Signed)
I never heard back on dates she was absent for the work note.   I assume she is back at work.  Please call and get exact dates she missed for the recent respiratory infection.

## 2019-11-08 NOTE — Telephone Encounter (Signed)
Reached out to patient via mychart. Waiting for her to let us know if she needs the work note or not.

## 2019-11-09 ENCOUNTER — Telehealth: Payer: Federal, State, Local not specified - PPO | Admitting: Medical

## 2019-11-11 DIAGNOSIS — Z713 Dietary counseling and surveillance: Secondary | ICD-10-CM | POA: Diagnosis not present

## 2019-11-21 ENCOUNTER — Other Ambulatory Visit: Payer: Self-pay | Admitting: Family Medicine

## 2019-11-21 DIAGNOSIS — J4530 Mild persistent asthma, uncomplicated: Secondary | ICD-10-CM

## 2019-11-22 ENCOUNTER — Ambulatory Visit (INDEPENDENT_AMBULATORY_CARE_PROVIDER_SITE_OTHER): Payer: Federal, State, Local not specified - PPO | Admitting: Psychology

## 2019-11-22 DIAGNOSIS — F3342 Major depressive disorder, recurrent, in full remission: Secondary | ICD-10-CM

## 2019-11-24 DIAGNOSIS — Z713 Dietary counseling and surveillance: Secondary | ICD-10-CM | POA: Diagnosis not present

## 2019-12-14 DIAGNOSIS — Z713 Dietary counseling and surveillance: Secondary | ICD-10-CM | POA: Diagnosis not present

## 2019-12-20 ENCOUNTER — Ambulatory Visit (INDEPENDENT_AMBULATORY_CARE_PROVIDER_SITE_OTHER): Payer: Federal, State, Local not specified - PPO | Admitting: Psychology

## 2019-12-20 DIAGNOSIS — F3342 Major depressive disorder, recurrent, in full remission: Secondary | ICD-10-CM

## 2019-12-29 DIAGNOSIS — F339 Major depressive disorder, recurrent, unspecified: Secondary | ICD-10-CM | POA: Diagnosis not present

## 2019-12-29 DIAGNOSIS — F1010 Alcohol abuse, uncomplicated: Secondary | ICD-10-CM | POA: Diagnosis not present

## 2019-12-29 DIAGNOSIS — F900 Attention-deficit hyperactivity disorder, predominantly inattentive type: Secondary | ICD-10-CM | POA: Diagnosis not present

## 2020-01-03 ENCOUNTER — Ambulatory Visit (INDEPENDENT_AMBULATORY_CARE_PROVIDER_SITE_OTHER): Payer: Federal, State, Local not specified - PPO | Admitting: Psychology

## 2020-01-03 DIAGNOSIS — F3342 Major depressive disorder, recurrent, in full remission: Secondary | ICD-10-CM

## 2020-01-12 DIAGNOSIS — Z713 Dietary counseling and surveillance: Secondary | ICD-10-CM | POA: Diagnosis not present

## 2020-01-17 ENCOUNTER — Ambulatory Visit (INDEPENDENT_AMBULATORY_CARE_PROVIDER_SITE_OTHER): Payer: Federal, State, Local not specified - PPO | Admitting: Psychology

## 2020-01-17 DIAGNOSIS — F3342 Major depressive disorder, recurrent, in full remission: Secondary | ICD-10-CM

## 2020-01-19 ENCOUNTER — Encounter: Payer: Self-pay | Admitting: Physician Assistant

## 2020-01-31 ENCOUNTER — Ambulatory Visit (INDEPENDENT_AMBULATORY_CARE_PROVIDER_SITE_OTHER): Payer: Federal, State, Local not specified - PPO | Admitting: Psychology

## 2020-01-31 DIAGNOSIS — F3342 Major depressive disorder, recurrent, in full remission: Secondary | ICD-10-CM

## 2020-02-09 ENCOUNTER — Ambulatory Visit: Payer: Federal, State, Local not specified - PPO | Admitting: Physician Assistant

## 2020-02-09 DIAGNOSIS — F339 Major depressive disorder, recurrent, unspecified: Secondary | ICD-10-CM | POA: Diagnosis not present

## 2020-02-09 DIAGNOSIS — F900 Attention-deficit hyperactivity disorder, predominantly inattentive type: Secondary | ICD-10-CM | POA: Diagnosis not present

## 2020-02-14 ENCOUNTER — Ambulatory Visit: Payer: Federal, State, Local not specified - PPO | Admitting: Psychology

## 2020-02-25 DIAGNOSIS — Z713 Dietary counseling and surveillance: Secondary | ICD-10-CM | POA: Diagnosis not present

## 2020-02-28 ENCOUNTER — Ambulatory Visit (INDEPENDENT_AMBULATORY_CARE_PROVIDER_SITE_OTHER): Payer: Federal, State, Local not specified - PPO | Admitting: Psychology

## 2020-02-28 DIAGNOSIS — F3342 Major depressive disorder, recurrent, in full remission: Secondary | ICD-10-CM

## 2020-03-01 ENCOUNTER — Ambulatory Visit: Payer: Federal, State, Local not specified - PPO | Admitting: Gastroenterology

## 2020-03-01 ENCOUNTER — Ambulatory Visit: Payer: Federal, State, Local not specified - PPO | Admitting: Medical

## 2020-03-09 DIAGNOSIS — F900 Attention-deficit hyperactivity disorder, predominantly inattentive type: Secondary | ICD-10-CM | POA: Diagnosis not present

## 2020-03-09 DIAGNOSIS — F339 Major depressive disorder, recurrent, unspecified: Secondary | ICD-10-CM | POA: Diagnosis not present

## 2020-03-13 ENCOUNTER — Ambulatory Visit (INDEPENDENT_AMBULATORY_CARE_PROVIDER_SITE_OTHER): Payer: Federal, State, Local not specified - PPO | Admitting: Psychology

## 2020-03-13 DIAGNOSIS — F3342 Major depressive disorder, recurrent, in full remission: Secondary | ICD-10-CM | POA: Diagnosis not present

## 2020-03-20 ENCOUNTER — Ambulatory Visit: Payer: Federal, State, Local not specified - PPO | Admitting: Nurse Practitioner

## 2020-03-20 NOTE — Progress Notes (Deleted)
03/20/2020 Krista [name] [id number] [birth year]   CHIEF COMPLAINT:   HISTORY OF PRESENT ILLNESS:  Krista [name] is a 35 year old female with a past medical history of anxiety, depression, asthma, anemia  S/P right partial nephrectomy 2018  Abdominal sonogram 08/01/2019: Unremarkable right upper quadrant ultrasound.  No gallstones.  CBC Latest Ref Rng & Units 07/31/2019 03/22/2019 06/24/2017  WBC 4.0 - 10.5 K/uL 9.6 7.3 8.4  Hemoglobin 12.0 - 15.0 g/dL 67.6 72.0 94.7  Hematocrit 36.0 - 46.0 % 38.2 37.8 37.7  Platelets 150 - 400 K/uL 268 262 241    CMP Latest Ref Rng & Units 07/31/2019 03/22/2019 02/08/2017  Glucose 70 - 99 mg/dL 096(G) 66 836(O)  BUN 6 - 20 mg/dL 12 12 6   Creatinine 0.44 - 1.00 mg/dL ) 2.94(T 6.54  Sodium 135 - 145 mmol/L 141 142 136  Potassium 3.5 - 5.1 mmol/L 3.8 4.3 3.8  Chloride 98 - 111 mmol/L 107 104 106  CO2 22 - 32 mmol/L 25 26 24   Calcium 8.9 - 10.3 mg/dL 9.2 9.9 6.50)  Total Protein 6.5 - 8.1 g/dL 6.8 6.9 -  Total Bilirubin 0.3 - 1.2 mg/dL ) 0.3 -  Alkaline Phos 38 - 126 U/L 57 87 -  AST 15 - 41 U/L 13(L) 15 -  ALT 0 - 44 U/L 12 15 -     Past Medical History:  Diagnosis Date  . Anemia    History of  . Anxiety   . Asthma   . Cyst of right kidney   . Depression    Past Surgical History:  Procedure Laterality Date  . DILATION AND EVACUATION N/A 02/21/2016   Procedure: DILATATION AND EVACUATION;  Surgeon: 6.5(K, DO;  Location: [facility];  Service: Gynecology;  Laterality: N/A;  . IUD REMOVAL  11/2016  . NO PAST SURGERIES    . ROBOTIC ASSITED PARTIAL NEPHRECTOMY Right 02/07/2017   Procedure: XI ROBOTIC ASSITED PARTIAL NEPHRECTOMY;  Surgeon: 12/2016, MD;  Location: [facility];  Service: Urology;  Laterality: Right;   Social History:  Family History:    reports that she has never smoked. She has never used smokeless tobacco. She reports current alcohol use. She reports that she does not use drugs. family history  includes Asthma in her sister. Allergies  Allergen Reactions  . Shellfish Allergy Itching    Tongue itch and gets numb       Outpatient Encounter Medications as of 03/20/2020  Medication Sig  . albuterol (VENTOLIN HFA) 108 (90 Base) MCG/ACT inhaler INHALE 2 PUFFS INTO THE LUNGS EVERY 6 HOURS AS NEEDED FOR WHEEZING OR SHORTNESS OF BREATH  . benzonatate (TESSALON) 200 MG capsule Take 1 capsule (200 mg total) by mouth 2 (two) times daily as needed for cough.  . budesonide-formoterol (SYMBICORT) 160-4.5 MCG/ACT inhaler Inhale 2 puffs into the lungs 2 (two) times daily.   Sebastian Ache buPROPion (WELLBUTRIN XL) 150 MG 24 hr tablet Take 150 mg by mouth 3 (three) times daily.   . cetirizine (ZYRTEC) 10 MG tablet Take 10 mg by mouth daily as needed for allergies or rhinitis.   . cholecalciferol (VITAMIN D3) 25 MCG (1000 UNIT) tablet Take 2 tablets (2,000 Units total) by mouth daily.  . clindamycin-benzoyl peroxide (BENZACLIN) gel Apply 1 application topically every morning.  03/22/2020 EPINEPHrine 0.3 mg/0.3 mL IJ SOAJ injection Inject 0.3 mg into the muscle as needed for anaphylaxis.   Marland Kitchen escitalopram (LEXAPRO) 10 MG tablet Take 10 mg by mouth  daily.  . levonorgestrel-ethinyl estradiol (SEASONALE) 0.15-0.03 MG tablet Take 1 tablet by mouth daily.  . mometasone (NASONEX) 50 MCG/ACT nasal spray Place 2 sprays into the nose daily.   . montelukast (SINGULAIR) 10 MG tablet Take by mouth.  . Prenatal Multivit-Min-Fe-FA (PRENATAL VITAMINS) 0.8 MG tablet Take 1 tablet by mouth daily.  Marland Kitchen tretinoin (RETIN-A) 0.05 % cream Apply 1 application topically at bedtime.    No facility-administered encounter medications on file as of 03/20/2020.     REVIEW OF SYSTEMS:  Gen: Denies fever, sweats or chills. No weight loss.  CV: Denies chest pain, palpitations or edema. Resp: Denies cough, shortness of breath of hemoptysis.  GI: Denies heartburn, dysphagia, stomach or lower abdominal pain. No diarrhea or constipation.  GU : Denies  urinary burning, blood in urine, increased urinary frequency or incontinence. MS: Denies joint pain, muscles aches or weakness. Derm: Denies rash, itchiness, skin lesions or unhealing ulcers. Psych: Denies depression, anxiety, memory loss, suicidal ideation and confusion. Heme: Denies bruising, bleeding. Neuro:  Denies headaches, dizziness or paresthesias. Endo:  Denies any problems with DM, thyroid or adrenal function.    PHYSICAL EXAM: There were no vitals taken for this visit. General: Well developed ... in no acute distress. Head: Normocephalic and atraumatic. Eyes:  Sclerae non-icteric, conjunctive pink. Ears: Normal auditory acuity. Mouth: Dentition intact. No ulcers or lesions.  Neck: Supple, no lymphadenopathy or thyromegaly.  Lungs: Clear bilaterally to auscultation without wheezes, crackles or rhonchi. Heart: Regular rate and rhythm. No murmur, rub or gallop appreciated.  Abdomen: Soft, nontender, non distended. No masses. No hepatosplenomegaly. Normoactive bowel sounds x 4 quadrants.  Rectal:  Musculoskeletal: Symmetrical with no gross deformities. Skin: Warm and dry. No rash or lesions on visible extremities. Extremities: No edema. Neurological: Alert oriented x 4, no focal deficits.  Psychological:  Alert and cooperative. Normal mood and affect.  ASSESSMENT AND PLAN:    CC:  Tysinger, Kermit Balo, PA-C

## 2020-03-21 DIAGNOSIS — Z713 Dietary counseling and surveillance: Secondary | ICD-10-CM | POA: Diagnosis not present

## 2020-03-27 ENCOUNTER — Ambulatory Visit: Payer: Federal, State, Local not specified - PPO | Admitting: Psychology

## 2020-03-30 ENCOUNTER — Ambulatory Visit: Payer: Federal, State, Local not specified - PPO | Admitting: Psychology

## 2020-04-10 ENCOUNTER — Ambulatory Visit (INDEPENDENT_AMBULATORY_CARE_PROVIDER_SITE_OTHER): Payer: Federal, State, Local not specified - PPO | Admitting: Psychology

## 2020-04-10 DIAGNOSIS — F3342 Major depressive disorder, recurrent, in full remission: Secondary | ICD-10-CM

## 2020-04-20 ENCOUNTER — Ambulatory Visit (INDEPENDENT_AMBULATORY_CARE_PROVIDER_SITE_OTHER): Payer: Federal, State, Local not specified - PPO | Admitting: Psychology

## 2020-04-20 DIAGNOSIS — F3342 Major depressive disorder, recurrent, in full remission: Secondary | ICD-10-CM

## 2020-04-24 ENCOUNTER — Ambulatory Visit (INDEPENDENT_AMBULATORY_CARE_PROVIDER_SITE_OTHER): Payer: Federal, State, Local not specified - PPO | Admitting: Psychology

## 2020-04-24 DIAGNOSIS — Z713 Dietary counseling and surveillance: Secondary | ICD-10-CM | POA: Diagnosis not present

## 2020-04-24 DIAGNOSIS — F3342 Major depressive disorder, recurrent, in full remission: Secondary | ICD-10-CM | POA: Diagnosis not present

## 2020-05-04 ENCOUNTER — Ambulatory Visit (INDEPENDENT_AMBULATORY_CARE_PROVIDER_SITE_OTHER): Payer: Federal, State, Local not specified - PPO | Admitting: Psychology

## 2020-05-04 DIAGNOSIS — F330 Major depressive disorder, recurrent, mild: Secondary | ICD-10-CM

## 2020-05-08 ENCOUNTER — Ambulatory Visit: Payer: Federal, State, Local not specified - PPO | Admitting: Psychology

## 2020-05-09 ENCOUNTER — Ambulatory Visit: Payer: Federal, State, Local not specified - PPO | Admitting: Psychology

## 2020-05-11 ENCOUNTER — Ambulatory Visit (INDEPENDENT_AMBULATORY_CARE_PROVIDER_SITE_OTHER): Payer: Federal, State, Local not specified - PPO | Admitting: Psychology

## 2020-05-11 DIAGNOSIS — F331 Major depressive disorder, recurrent, moderate: Secondary | ICD-10-CM

## 2020-05-22 ENCOUNTER — Ambulatory Visit (INDEPENDENT_AMBULATORY_CARE_PROVIDER_SITE_OTHER): Payer: Federal, State, Local not specified - PPO | Admitting: Psychology

## 2020-05-22 DIAGNOSIS — F331 Major depressive disorder, recurrent, moderate: Secondary | ICD-10-CM | POA: Diagnosis not present

## 2020-05-24 DIAGNOSIS — Z713 Dietary counseling and surveillance: Secondary | ICD-10-CM | POA: Diagnosis not present

## 2020-05-29 ENCOUNTER — Ambulatory Visit (INDEPENDENT_AMBULATORY_CARE_PROVIDER_SITE_OTHER): Payer: Federal, State, Local not specified - PPO | Admitting: Psychology

## 2020-05-29 DIAGNOSIS — F330 Major depressive disorder, recurrent, mild: Secondary | ICD-10-CM | POA: Diagnosis not present

## 2020-06-05 ENCOUNTER — Ambulatory Visit: Payer: Federal, State, Local not specified - PPO | Admitting: Psychology

## 2020-06-06 ENCOUNTER — Other Ambulatory Visit: Payer: Self-pay | Admitting: Medical

## 2020-06-14 DIAGNOSIS — J3081 Allergic rhinitis due to animal (cat) (dog) hair and dander: Secondary | ICD-10-CM | POA: Diagnosis not present

## 2020-06-14 DIAGNOSIS — J3089 Other allergic rhinitis: Secondary | ICD-10-CM | POA: Diagnosis not present

## 2020-06-14 DIAGNOSIS — J4520 Mild intermittent asthma, uncomplicated: Secondary | ICD-10-CM | POA: Diagnosis not present

## 2020-06-14 DIAGNOSIS — J301 Allergic rhinitis due to pollen: Secondary | ICD-10-CM | POA: Diagnosis not present

## 2020-06-19 ENCOUNTER — Ambulatory Visit (INDEPENDENT_AMBULATORY_CARE_PROVIDER_SITE_OTHER): Payer: Federal, State, Local not specified - PPO | Admitting: Psychology

## 2020-06-19 DIAGNOSIS — F330 Major depressive disorder, recurrent, mild: Secondary | ICD-10-CM

## 2020-06-26 ENCOUNTER — Encounter: Payer: Self-pay | Admitting: Family Medicine

## 2020-06-26 ENCOUNTER — Ambulatory Visit: Payer: Federal, State, Local not specified - PPO | Admitting: Family Medicine

## 2020-06-26 ENCOUNTER — Other Ambulatory Visit: Payer: Self-pay

## 2020-06-26 VITALS — BP 122/78 | HR 84 | Temp 98.1°F | Wt 208.6 lb

## 2020-06-26 DIAGNOSIS — M791 Myalgia, unspecified site: Secondary | ICD-10-CM | POA: Diagnosis not present

## 2020-06-26 DIAGNOSIS — M2550 Pain in unspecified joint: Secondary | ICD-10-CM | POA: Diagnosis not present

## 2020-06-26 DIAGNOSIS — R42 Dizziness and giddiness: Secondary | ICD-10-CM | POA: Diagnosis not present

## 2020-06-26 DIAGNOSIS — M1990 Unspecified osteoarthritis, unspecified site: Secondary | ICD-10-CM | POA: Diagnosis not present

## 2020-06-26 NOTE — Progress Notes (Signed)
   Subjective:    Patient ID: Krista [name], female    DOB: 1985-04-10, 35 y.o.   MRN: [id number]  HPI She complains of a 1 week history of generalized aches and pains but no fever, chills, sore throat, coughing.  She continues on her present medication regimen.  She has had COVID-vaccine.  [place].   Review of Systems     Objective:   Physical Exam Alert and in no distress. Tympanic membranes and canals are normal. Pharyngeal area is normal. Neck is supple without adenopathy or thyromegaly. Cardiac exam shows a regular sinus rhythm without murmurs or gallops. Lungs are clear to auscultation.        Assessment & Plan:  Myalgia, unspecified site  Arthralgia, unspecified joint I explained that I did not find a cause for these symptoms but we will start with symptomatic care.  Explained that I did not find any red flags to be concerned about.  If she continues have difficulty she will return here for recheck.  Lets start with 2 Tylenol 4 times per day and you can also take 2 Aleve twice per day.  Do this for about a week

## 2020-06-26 NOTE — Patient Instructions (Signed)
Lets start with 2 Tylenol 4 times per day and you can also take 2 Aleve twice per day.  Do this for about a week

## 2020-06-28 ENCOUNTER — Ambulatory Visit (INDEPENDENT_AMBULATORY_CARE_PROVIDER_SITE_OTHER): Payer: Federal, State, Local not specified - PPO | Admitting: Psychology

## 2020-06-28 DIAGNOSIS — F330 Major depressive disorder, recurrent, mild: Secondary | ICD-10-CM | POA: Diagnosis not present

## 2020-06-28 DIAGNOSIS — Z713 Dietary counseling and surveillance: Secondary | ICD-10-CM | POA: Diagnosis not present

## 2020-07-03 ENCOUNTER — Ambulatory Visit: Payer: Federal, State, Local not specified - PPO | Admitting: Psychology

## 2020-07-05 ENCOUNTER — Ambulatory Visit: Payer: Federal, State, Local not specified - PPO | Admitting: Medical

## 2020-07-05 ENCOUNTER — Encounter: Payer: Self-pay | Admitting: Medical

## 2020-07-05 ENCOUNTER — Other Ambulatory Visit: Payer: Self-pay

## 2020-07-05 VITALS — BP 110/70 | HR 87 | Ht 61.0 in | Wt 208.8 lb

## 2020-07-05 DIAGNOSIS — R5382 Chronic fatigue, unspecified: Secondary | ICD-10-CM | POA: Diagnosis not present

## 2020-07-05 DIAGNOSIS — R42 Dizziness and giddiness: Secondary | ICD-10-CM | POA: Insufficient documentation

## 2020-07-05 DIAGNOSIS — M779 Enthesopathy, unspecified: Secondary | ICD-10-CM

## 2020-07-05 DIAGNOSIS — H9319 Tinnitus, unspecified ear: Secondary | ICD-10-CM | POA: Insufficient documentation

## 2020-07-05 DIAGNOSIS — R200 Anesthesia of skin: Secondary | ICD-10-CM | POA: Insufficient documentation

## 2020-07-05 DIAGNOSIS — G9332 Myalgic encephalomyelitis/chronic fatigue syndrome: Secondary | ICD-10-CM | POA: Insufficient documentation

## 2020-07-05 DIAGNOSIS — R202 Paresthesia of skin: Secondary | ICD-10-CM

## 2020-07-05 DIAGNOSIS — M2550 Pain in unspecified joint: Secondary | ICD-10-CM | POA: Diagnosis not present

## 2020-07-05 NOTE — Progress Notes (Signed)
Subjective:  Krista [name] is a 35 y.o. female who presents for Chief Complaint  Patient presents with  . Dizziness    Felt dizzy this morning while getting ready. Wants blood work done      She notes symptoms of vertigo, body aches, fatigue, and joint pains.    She notes fatigue every day and all the day x months.   (of note, sleep study 05/2019 showing mild sleep apnea).  Exercise - walking 2 days per week.  Works in Office manager at [employer]    She notes vertigo/dizziness that lasts for seconds but this happens often.  Lately feels like she is going to fall over.  Sometimes it can feel like the room spinning.  Doesn't get this sensation in bed.  No palpitations.   No chest pain.  No sob.  No swelling in legs.  Denies muscle twitching or spasm.  She notes aching in both elbows, sometimes right knee, right wrist.  No joint swelling.    She notes pains in right forearm.  Nonsmoker, no heavy alcohol use.     She notes regular periods, not heavy.  No blood in stool or urine.   Occasional abdominal discomfort on right but not significant.  She denies hearing loss, but does get some ringing in ears.   No other aggravating or relieving factors.    No other c/o.  Past Medical History:  Diagnosis Date  . Anemia    History of  . Anxiety   . Asthma   . Cyst of right kidney   . Depression   ' Family History  Problem Relation Age of Onset  . Asthma Sister   . Arthritis Mother        OA?, not specifically RA  . COPD Mother   . Arthritis Maternal Grandmother   . Heart disease Neg Hx   . Cancer Neg Hx   . Stroke Neg Hx      The following portions of the patient's history were reviewed and updated as appropriate: allergies, current medications, past family history, past medical history, past social history, past surgical history and problem list.  ROS Otherwise as in subjective above  Objective: BP 110/70   Pulse 87   Ht 5\' 1"  (1.549 m)   Wt 208 lb 12.8 oz (94.7 kg)   SpO2 94%   BMI  39.45 kg/m   General appearance: alert, no distress, well developed, well nourished, African American female HEENT: normocephalic, sclerae anicteric, conjunctiva pink and moist, TMs pearly, nares patent, no discharge or erythema, pharynx normal Neck: supple, no lymphadenopathy, no thyromegaly, no masses Heart: RRR, normal S1, S2, no murmurs Lungs: CTA bilaterally, no wheezes, rhonchi, or rales Abdomen: +bs, soft, non tender, non distended, no masses, no hepatomegaly, no splenomegaly MSK: mild tenderness right forearm in general, but nontender of wrists, no swelling or deformity of wrists or hands, elbows nontender, normal ROM, right knee with mild pain with mcmurray, but no laxity, no swelling, otherwise UE and LE unremarkable Pulses: 2+ radial pulses, 2+ pedal pulses, normal cap refill Ext: no edema Neuro: cn2-12 intake, nonfocal exam Psych: pleasant, good eye contact, answers questions approprietly   Assessment: Encounter Diagnoses  Name Primary?  . Chronic fatigue syndrome Yes  . Polyarthralgia   . Dizziness   . Tinnitus, unspecified laterality   . Numbness and tingling in right hand   . Tendonitis      Plan: We discussed your symptoms and concerns.  We will check some labs today  to further evaluate your concerns  You appear to have some right arm tendinitis.  I would recommend you wear a reinforced wrist splint at night for the next 7 to 10 days given the wrist and forearm pain and numbness in the hand.  I recommend you drink plenty of water throughout the day.  Avoid soda and high sugar drinks.  Try to get 7 to 8 hours of sleep per night  Avoid a lot of processed and junk food or sweets.  Eat a good variety of healthy fruits and vegetables, whole grains, and in general may be cut back or cut out more animal products.  For example sometimes some people have the symptoms people will go vegetarian for period of time and avoid animal products at all.  Sometimes this can be  very helpful  Consider taking a multivitamin such as prenatal vitamin daily  We will call with lab results  Jasiyah was seen today for dizziness.  Diagnoses and all orders for this visit:  Chronic fatigue syndrome -     Comprehensive metabolic panel -     CBC with Differential/Platelet -     TSH -     ANA 12 Plus Profile (RDL)  Polyarthralgia -     Comprehensive metabolic panel -     CBC with Differential/Platelet -     TSH -     ANA 12 Plus Profile (RDL)  Dizziness -     Comprehensive metabolic panel -     CBC with Differential/Platelet -     TSH -     ANA 12 Plus Profile (RDL)  Tinnitus, unspecified laterality -     Comprehensive metabolic panel -     CBC with Differential/Platelet -     TSH -     ANA 12 Plus Profile (RDL)  Numbness and tingling in right hand -     Comprehensive metabolic panel -     CBC with Differential/Platelet -     TSH -     ANA 12 Plus Profile (RDL)  Tendonitis    Follow up: pending labs

## 2020-07-07 ENCOUNTER — Other Ambulatory Visit: Payer: Self-pay | Admitting: Medical

## 2020-07-07 MED ORDER — SCOPOLAMINE 1 MG/3DAYS TD PT72: 1.0000 | MEDICATED_PATCH | TRANSDERMAL | 0 refills | Status: AC

## 2020-07-07 MED ORDER — MECLIZINE HCL 25 MG PO TABS
25.0000 mg | ORAL_TABLET | Freq: Two times a day (BID) | ORAL | 0 refills | Status: DC
Start: 2020-07-07 — End: 2020-07-07

## 2020-07-10 ENCOUNTER — Telehealth: Payer: Self-pay | Admitting: *Deleted

## 2020-07-10 NOTE — Telephone Encounter (Signed)
Patient called for ANA results, looked to me like it was not completely resulted yet-I told her she would hear something soon. Just wanted to double check with you and make sure that was correct.

## 2020-07-16 LAB — ANA 12 PLUS PROFILE, POSITIVE
Anti-CCP Ab, IgG & IgA (RDL): <20 Units (ref <20)
Anti-Cardiolipin Ab, IgA (RDL): <12 APL U/mL (ref <12)
Anti-Cardiolipin Ab, IgG (RDL): <15 GPL U/mL (ref <15)
Anti-Cardiolipin Ab, IgM (RDL): <13 MPL U/mL (ref <13)
Anti-Centromere Ab (RDL): <1:40 {titer}
Anti-Chromatin Ab, IgG (RDL): <20 Units (ref <20)
Anti-La (SS-B) Ab (RDL): <20 Units (ref <20)
Anti-Ro (SS-A) Ab (RDL): <20 Units (ref <20)
Anti-Scl-70 Ab (RDL): <20 Units (ref <20)
Anti-Sm Ab (RDL): <20 Units (ref <20)
Anti-TPO Ab (RDL): <9 IU/mL (ref <9.0)
Anti-U1 RNP Ab (RDL): <20 Units (ref <20)
Anti-dsDNA Ab by Farr(RDL): <8 IU/mL (ref <8.0)
C3 Complement (RDL): 166 mg/dL (ref 82–167)
C4 Complement (RDL): 41 mg/dL (ref 14–44)
Centriole Pattern: 1:40 {titer} — ABNORMAL HIGH
Rheumatoid Factor by Turb RDL: <14 IU/mL (ref <14)

## 2020-07-16 LAB — COMPREHENSIVE METABOLIC PANEL
ALT: 12 IU/L (ref 0–32)
AST: 13 IU/L (ref 0–40)
Albumin/Globulin Ratio: 1.6 (ref 1.2–2.2)
Albumin: 4.2 g/dL (ref 3.8–4.8)
Alkaline Phosphatase: 100 IU/L (ref 44–121)
BUN/Creatinine Ratio: 14 (ref 9–23)
BUN: 11 mg/dL (ref 6–20)
Bilirubin Total: 0.3 mg/dL (ref 0.0–1.2)
CO2: 24 mmol/L (ref 20–29)
Calcium: 9.8 mg/dL (ref 8.7–10.2)
Chloride: 102 mmol/L (ref 96–106)
Creatinine, Ser: 0.79 mg/dL (ref 0.57–1.00)
Globulin, Total: 2.6 g/dL (ref 1.5–4.5)
Glucose: 83 mg/dL (ref 65–99)
Potassium: 4.7 mmol/L (ref 3.5–5.2)
Sodium: 141 mmol/L (ref 134–144)
Total Protein: 6.8 g/dL (ref 6.0–8.5)
eGFR: 99 mL/min/{1.73_m2} (ref >59)

## 2020-07-16 LAB — ANA 12 PLUS PROFILE (RDL): Anti-Nuclear Ab by IFA (RDL): POSITIVE — AB

## 2020-07-16 LAB — CBC WITH DIFFERENTIAL/PLATELET
Basophils Absolute: 0 10*3/uL (ref 0.0–0.2)
Basos: 0 %
EOS (ABSOLUTE): 0.4 10*3/uL (ref 0.0–0.4)
Eos: 5 %
Hematocrit: 36.8 % (ref 34.0–46.6)
Hemoglobin: 12.4 g/dL (ref 11.1–15.9)
Immature Grans (Abs): 0 10*3/uL (ref 0.0–0.1)
Immature Granulocytes: 0 %
Lymphocytes Absolute: 1.8 10*3/uL (ref 0.7–3.1)
Lymphs: 25 %
MCH: 32 pg (ref 26.6–33.0)
MCHC: 33.7 g/dL (ref 31.5–35.7)
MCV: 95 fL (ref 79–97)
Monocytes Absolute: 0.9 10*3/uL (ref 0.1–0.9)
Monocytes: 12 %
Neutrophils Absolute: 4.2 10*3/uL (ref 1.4–7.0)
Neutrophils: 58 %
Platelets: 292 10*3/uL (ref 150–450)
RBC: 3.87 x10E6/uL (ref 3.77–5.28)
RDW: 11.8 % (ref 11.7–15.4)
WBC: 7.3 10*3/uL (ref 3.4–10.8)

## 2020-07-16 LAB — TSH: TSH: 0.831 u[IU]/mL (ref 0.450–4.500)

## 2020-07-17 ENCOUNTER — Ambulatory Visit (INDEPENDENT_AMBULATORY_CARE_PROVIDER_SITE_OTHER): Payer: Federal, State, Local not specified - PPO | Admitting: Psychology

## 2020-07-17 DIAGNOSIS — F3342 Major depressive disorder, recurrent, in full remission: Secondary | ICD-10-CM

## 2020-07-26 DIAGNOSIS — Z20822 Contact with and (suspected) exposure to covid-19: Secondary | ICD-10-CM | POA: Diagnosis not present

## 2020-07-26 DIAGNOSIS — Z03818 Encounter for observation for suspected exposure to other biological agents ruled out: Secondary | ICD-10-CM | POA: Diagnosis not present

## 2020-07-30 DIAGNOSIS — Z20822 Contact with and (suspected) exposure to covid-19: Secondary | ICD-10-CM | POA: Diagnosis not present

## 2020-07-31 ENCOUNTER — Ambulatory Visit (INDEPENDENT_AMBULATORY_CARE_PROVIDER_SITE_OTHER): Payer: Federal, State, Local not specified - PPO | Admitting: Psychology

## 2020-07-31 DIAGNOSIS — F330 Major depressive disorder, recurrent, mild: Secondary | ICD-10-CM | POA: Diagnosis not present

## 2020-08-02 ENCOUNTER — Other Ambulatory Visit: Payer: Self-pay | Admitting: Medical

## 2020-08-09 DIAGNOSIS — Z713 Dietary counseling and surveillance: Secondary | ICD-10-CM | POA: Diagnosis not present

## 2020-08-14 ENCOUNTER — Ambulatory Visit (INDEPENDENT_AMBULATORY_CARE_PROVIDER_SITE_OTHER): Payer: Federal, State, Local not specified - PPO | Admitting: Psychology

## 2020-08-14 DIAGNOSIS — F330 Major depressive disorder, recurrent, mild: Secondary | ICD-10-CM | POA: Diagnosis not present

## 2020-09-06 DIAGNOSIS — Z713 Dietary counseling and surveillance: Secondary | ICD-10-CM | POA: Diagnosis not present

## 2020-09-11 ENCOUNTER — Ambulatory Visit (INDEPENDENT_AMBULATORY_CARE_PROVIDER_SITE_OTHER): Payer: Federal, State, Local not specified - PPO | Admitting: Psychology

## 2020-09-11 DIAGNOSIS — F3342 Major depressive disorder, recurrent, in full remission: Secondary | ICD-10-CM | POA: Diagnosis not present

## 2020-09-19 ENCOUNTER — Ambulatory Visit (INDEPENDENT_AMBULATORY_CARE_PROVIDER_SITE_OTHER): Payer: Federal, State, Local not specified - PPO | Admitting: Psychology

## 2020-09-19 DIAGNOSIS — F332 Major depressive disorder, recurrent severe without psychotic features: Secondary | ICD-10-CM

## 2020-09-25 ENCOUNTER — Ambulatory Visit (INDEPENDENT_AMBULATORY_CARE_PROVIDER_SITE_OTHER): Payer: Federal, State, Local not specified - PPO | Admitting: Psychology

## 2020-09-25 DIAGNOSIS — F3342 Major depressive disorder, recurrent, in full remission: Secondary | ICD-10-CM | POA: Diagnosis not present

## 2020-10-09 ENCOUNTER — Ambulatory Visit (INDEPENDENT_AMBULATORY_CARE_PROVIDER_SITE_OTHER): Payer: Federal, State, Local not specified - PPO | Admitting: Psychology

## 2020-10-09 DIAGNOSIS — Z713 Dietary counseling and surveillance: Secondary | ICD-10-CM | POA: Diagnosis not present

## 2020-10-09 DIAGNOSIS — F3342 Major depressive disorder, recurrent, in full remission: Secondary | ICD-10-CM

## 2020-10-23 ENCOUNTER — Ambulatory Visit (INDEPENDENT_AMBULATORY_CARE_PROVIDER_SITE_OTHER): Payer: Federal, State, Local not specified - PPO | Admitting: Psychology

## 2020-10-23 DIAGNOSIS — F3342 Major depressive disorder, recurrent, in full remission: Secondary | ICD-10-CM | POA: Diagnosis not present

## 2020-11-06 ENCOUNTER — Ambulatory Visit (INDEPENDENT_AMBULATORY_CARE_PROVIDER_SITE_OTHER): Payer: Federal, State, Local not specified - PPO | Admitting: Psychology

## 2020-11-06 DIAGNOSIS — F3342 Major depressive disorder, recurrent, in full remission: Secondary | ICD-10-CM

## 2020-11-08 IMAGING — CR DG SINUSES 1-2V
2 series · 2 of 2 positions shown · non-contrast
Comparison: None.

CLINICAL DATA: 32-year-old female with maxillary sinus pain,
congestion, facial pain.

EXAM:
PARANASAL SINUSES - 1-2 VIEW

[w waters]
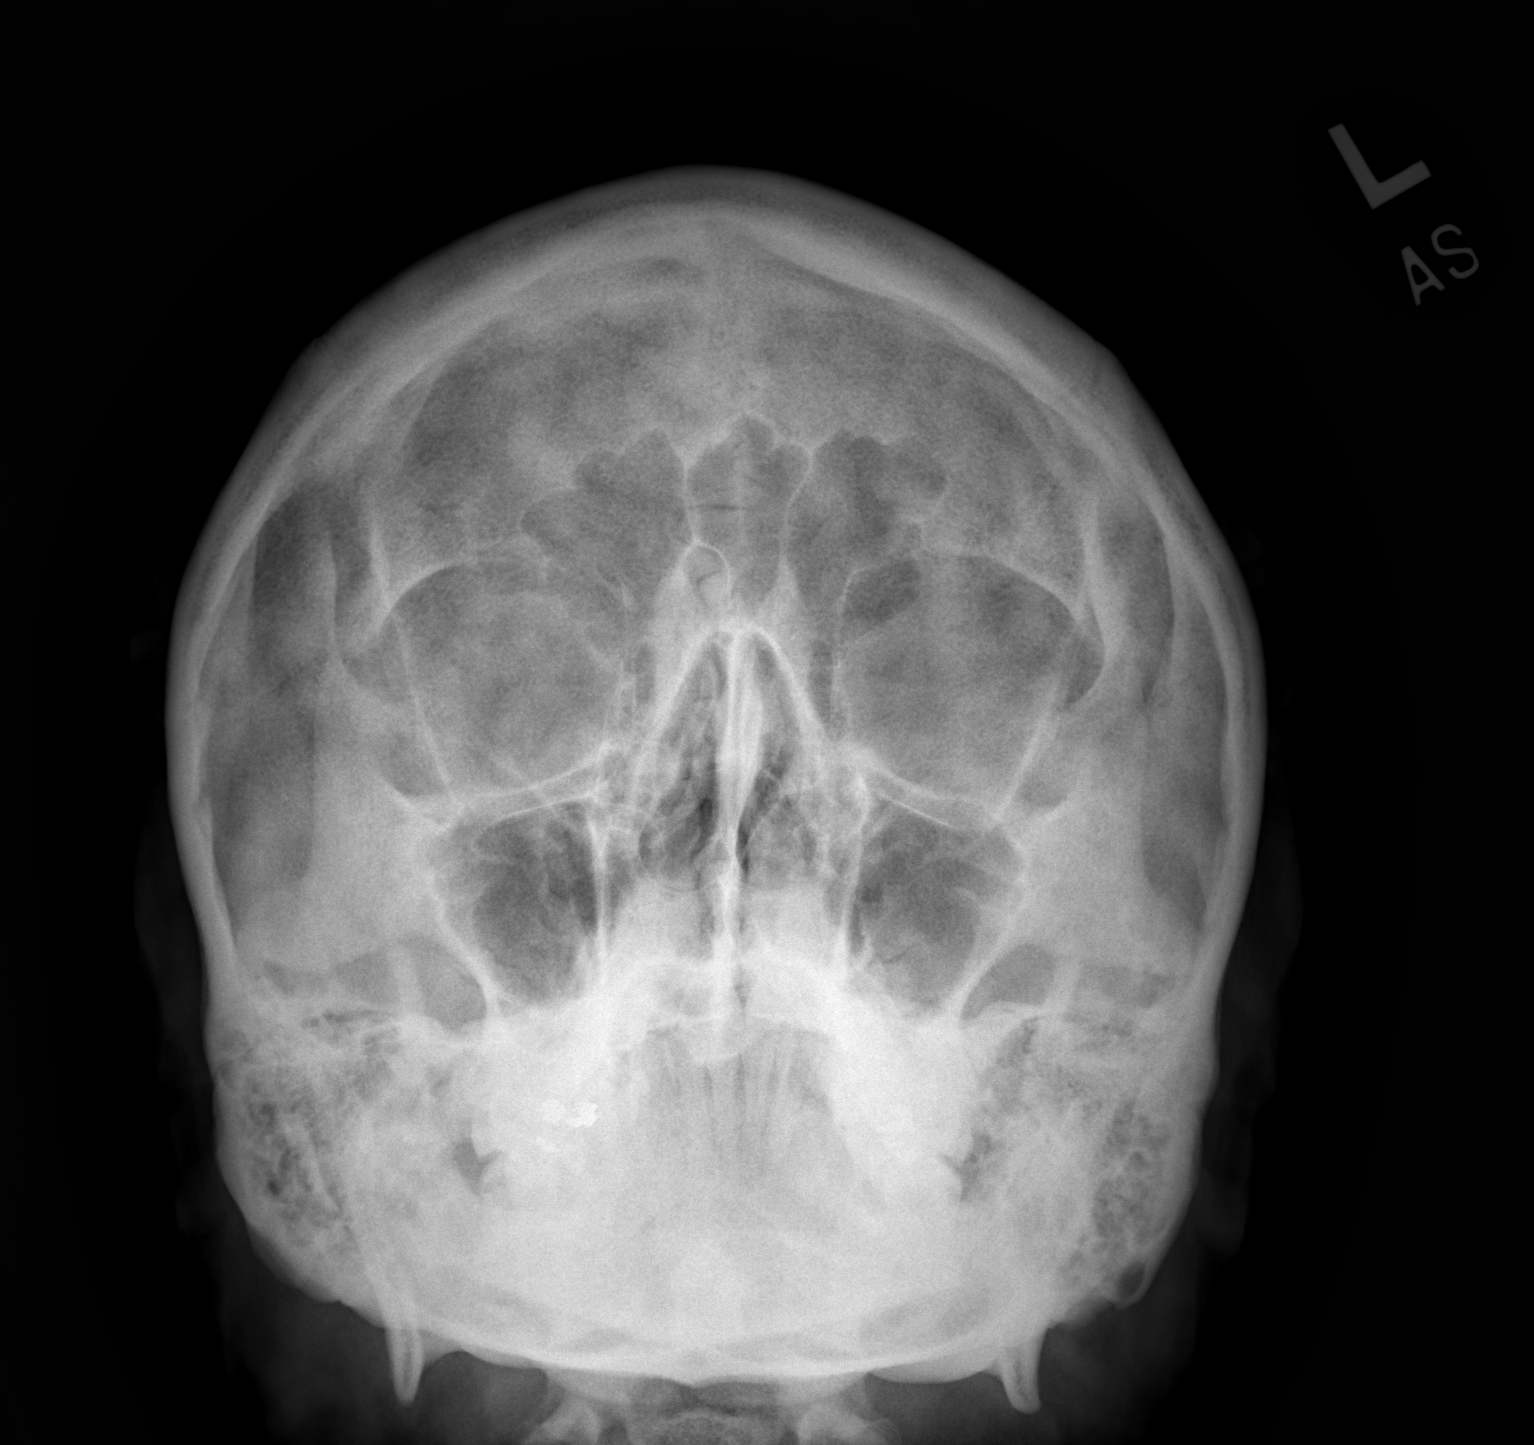

[w skull lat]
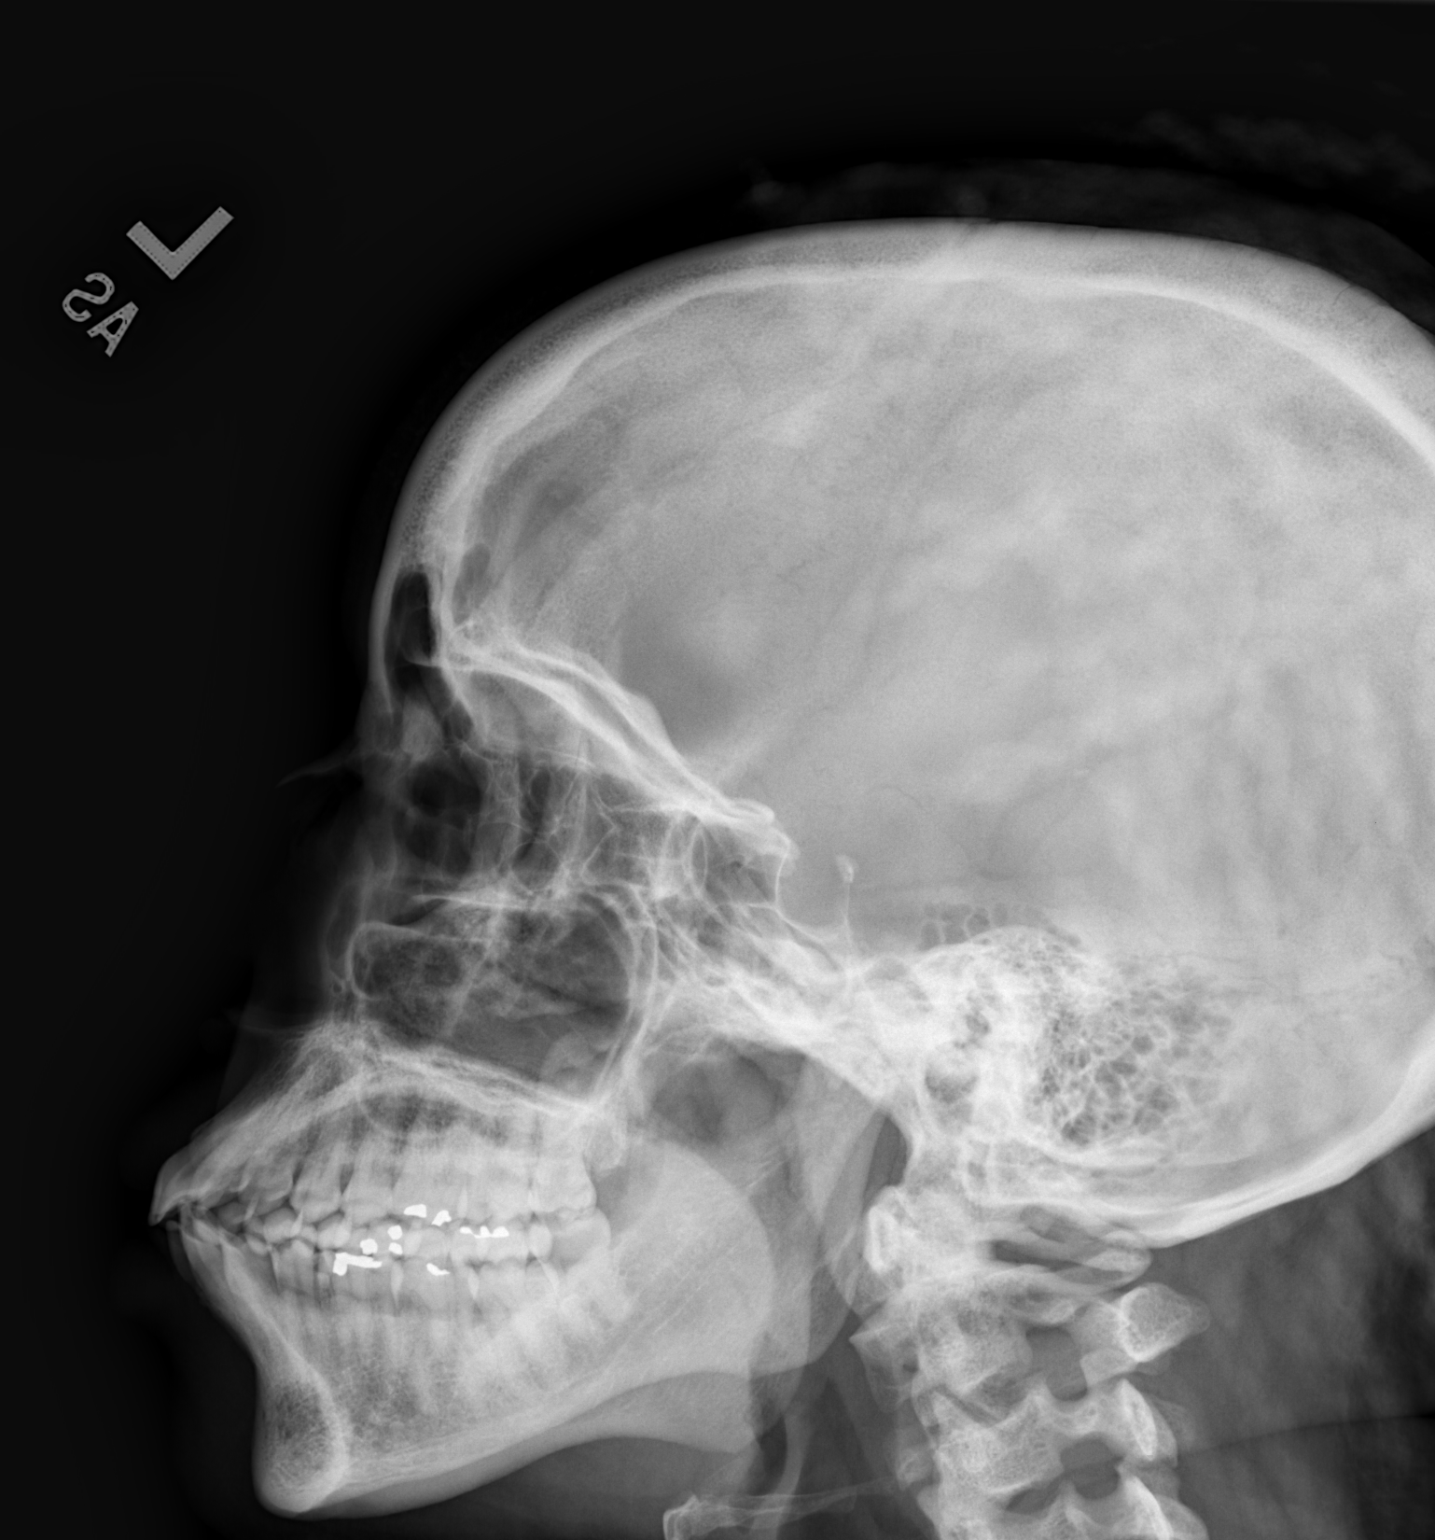

[2 of 2 positions shown; findings below may reference images not displayed]

FINDINGS: Bone mineralization is within normal limits. The paranasal sinus
appear symmetrically and normally aerated. There is no evidence of
sinus opacification air-fluid levels or mucosal thickening. No
significant bone abnormalities are seen.
IMPRESSION: Normal radiographic appearance of the paranasal sinuses.

## 2020-11-20 ENCOUNTER — Ambulatory Visit (INDEPENDENT_AMBULATORY_CARE_PROVIDER_SITE_OTHER): Payer: Federal, State, Local not specified - PPO | Admitting: Psychology

## 2020-11-20 DIAGNOSIS — F3342 Major depressive disorder, recurrent, in full remission: Secondary | ICD-10-CM | POA: Diagnosis not present

## 2020-11-22 DIAGNOSIS — Z713 Dietary counseling and surveillance: Secondary | ICD-10-CM | POA: Diagnosis not present

## 2020-12-04 ENCOUNTER — Ambulatory Visit: Payer: Federal, State, Local not specified - PPO | Admitting: Psychology

## 2020-12-18 ENCOUNTER — Ambulatory Visit (INDEPENDENT_AMBULATORY_CARE_PROVIDER_SITE_OTHER): Payer: Federal, State, Local not specified - PPO | Admitting: Psychology

## 2020-12-18 DIAGNOSIS — F3342 Major depressive disorder, recurrent, in full remission: Secondary | ICD-10-CM | POA: Diagnosis not present

## 2020-12-20 DIAGNOSIS — Z713 Dietary counseling and surveillance: Secondary | ICD-10-CM | POA: Diagnosis not present

## 2021-01-01 ENCOUNTER — Ambulatory Visit: Payer: Federal, State, Local not specified - PPO | Admitting: Psychology

## 2021-01-09 ENCOUNTER — Ambulatory Visit (INDEPENDENT_AMBULATORY_CARE_PROVIDER_SITE_OTHER): Payer: Federal, State, Local not specified - PPO | Admitting: Psychology

## 2021-01-09 DIAGNOSIS — F3342 Major depressive disorder, recurrent, in full remission: Secondary | ICD-10-CM | POA: Diagnosis not present

## 2021-01-15 ENCOUNTER — Ambulatory Visit (INDEPENDENT_AMBULATORY_CARE_PROVIDER_SITE_OTHER): Payer: Federal, State, Local not specified - PPO | Admitting: Psychology

## 2021-01-15 DIAGNOSIS — F3342 Major depressive disorder, recurrent, in full remission: Secondary | ICD-10-CM | POA: Diagnosis not present

## 2021-01-29 ENCOUNTER — Ambulatory Visit: Payer: Federal, State, Local not specified - PPO | Admitting: Psychology

## 2021-01-30 DIAGNOSIS — Z713 Dietary counseling and surveillance: Secondary | ICD-10-CM | POA: Diagnosis not present

## 2021-02-12 ENCOUNTER — Ambulatory Visit: Payer: Federal, State, Local not specified - PPO | Admitting: Psychology

## 2021-02-13 DIAGNOSIS — K080 Exfoliation of teeth due to systemic causes: Secondary | ICD-10-CM | POA: Diagnosis not present

## 2021-03-05 IMAGING — CR CHEST - 2 VIEW
2 series · 2 of 2 positions shown · non-contrast
Comparison: Chest radiograph 06/19/2013

CLINICAL DATA: Patient with shortness of breath.

EXAM:
CHEST - 2 VIEW

[w chest pa]
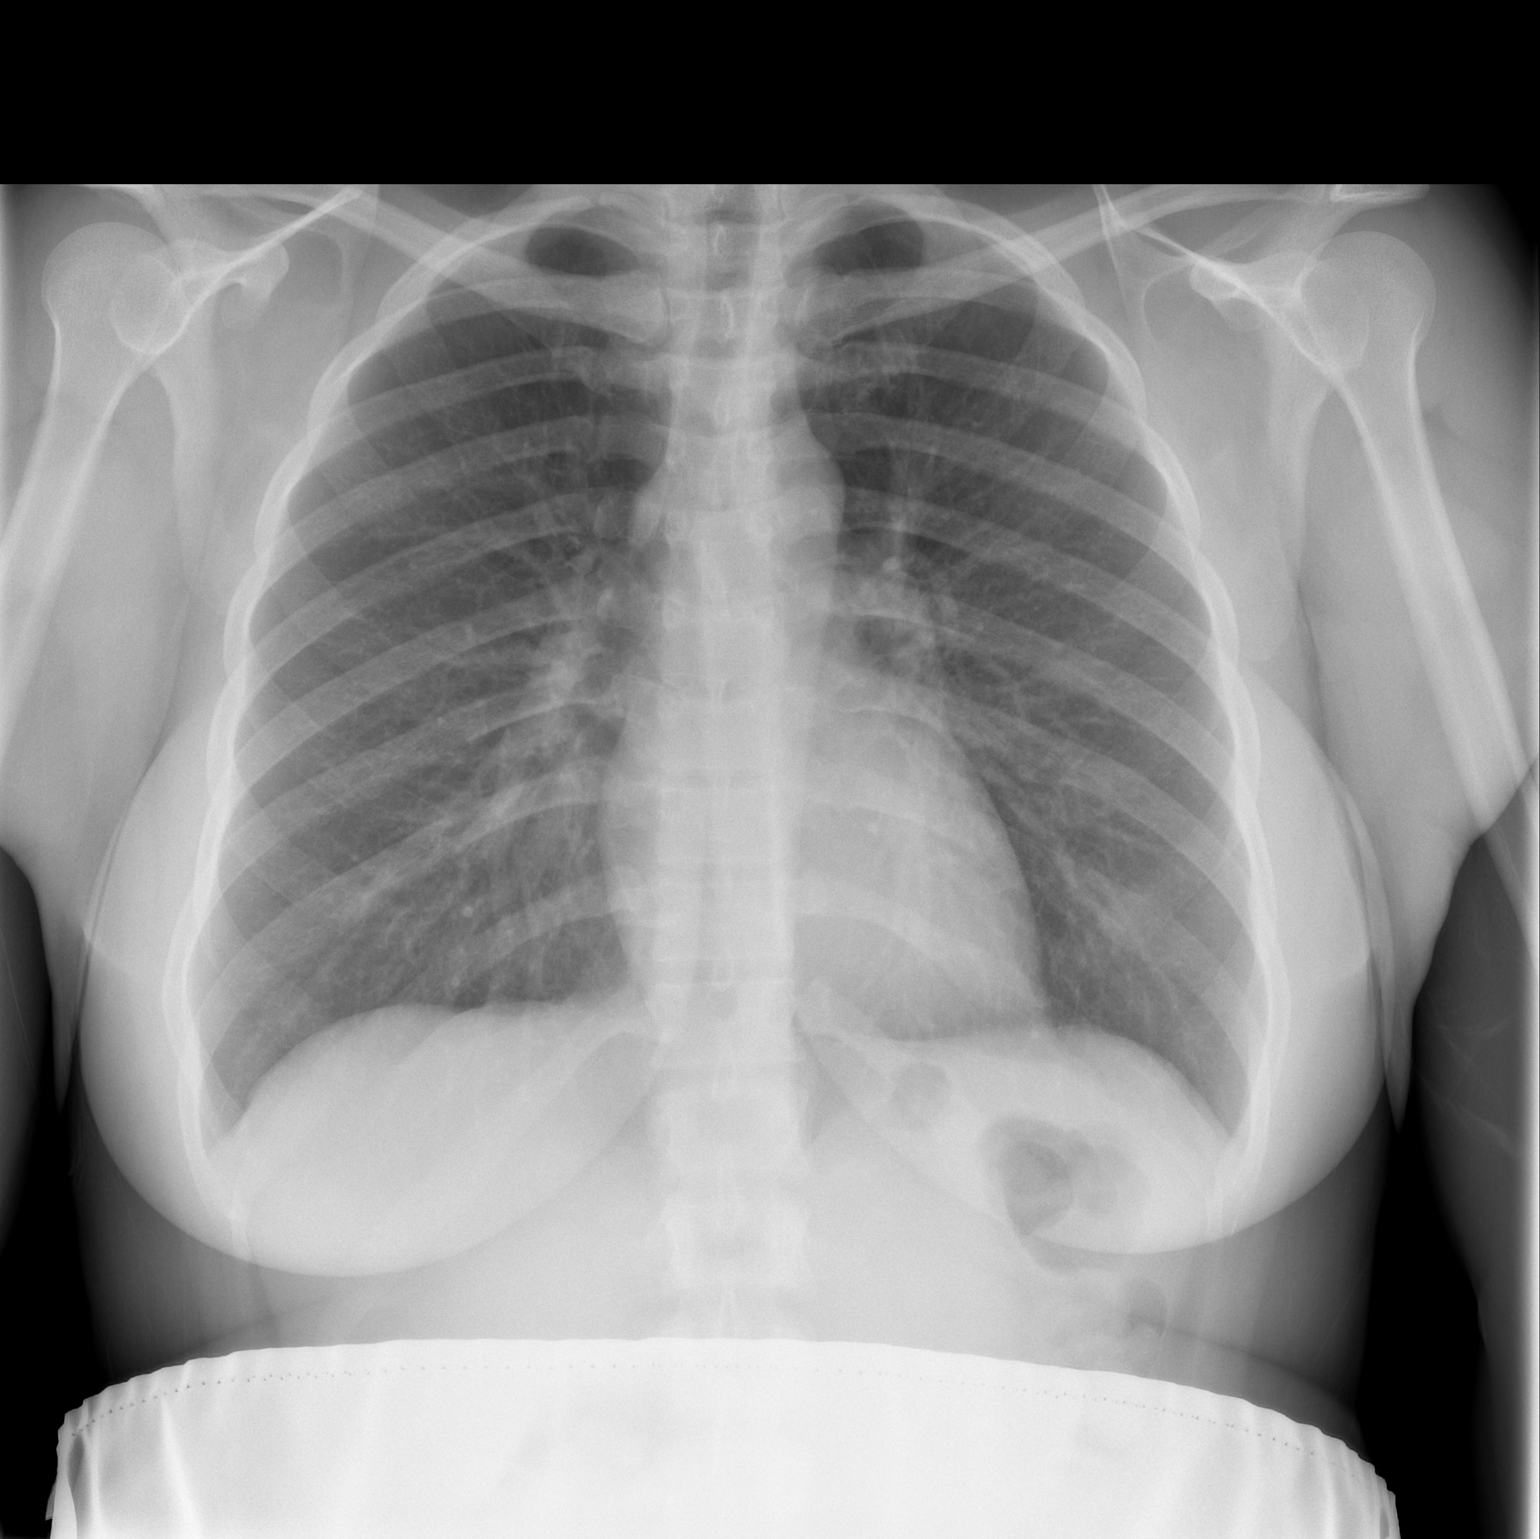

[w chest lat]
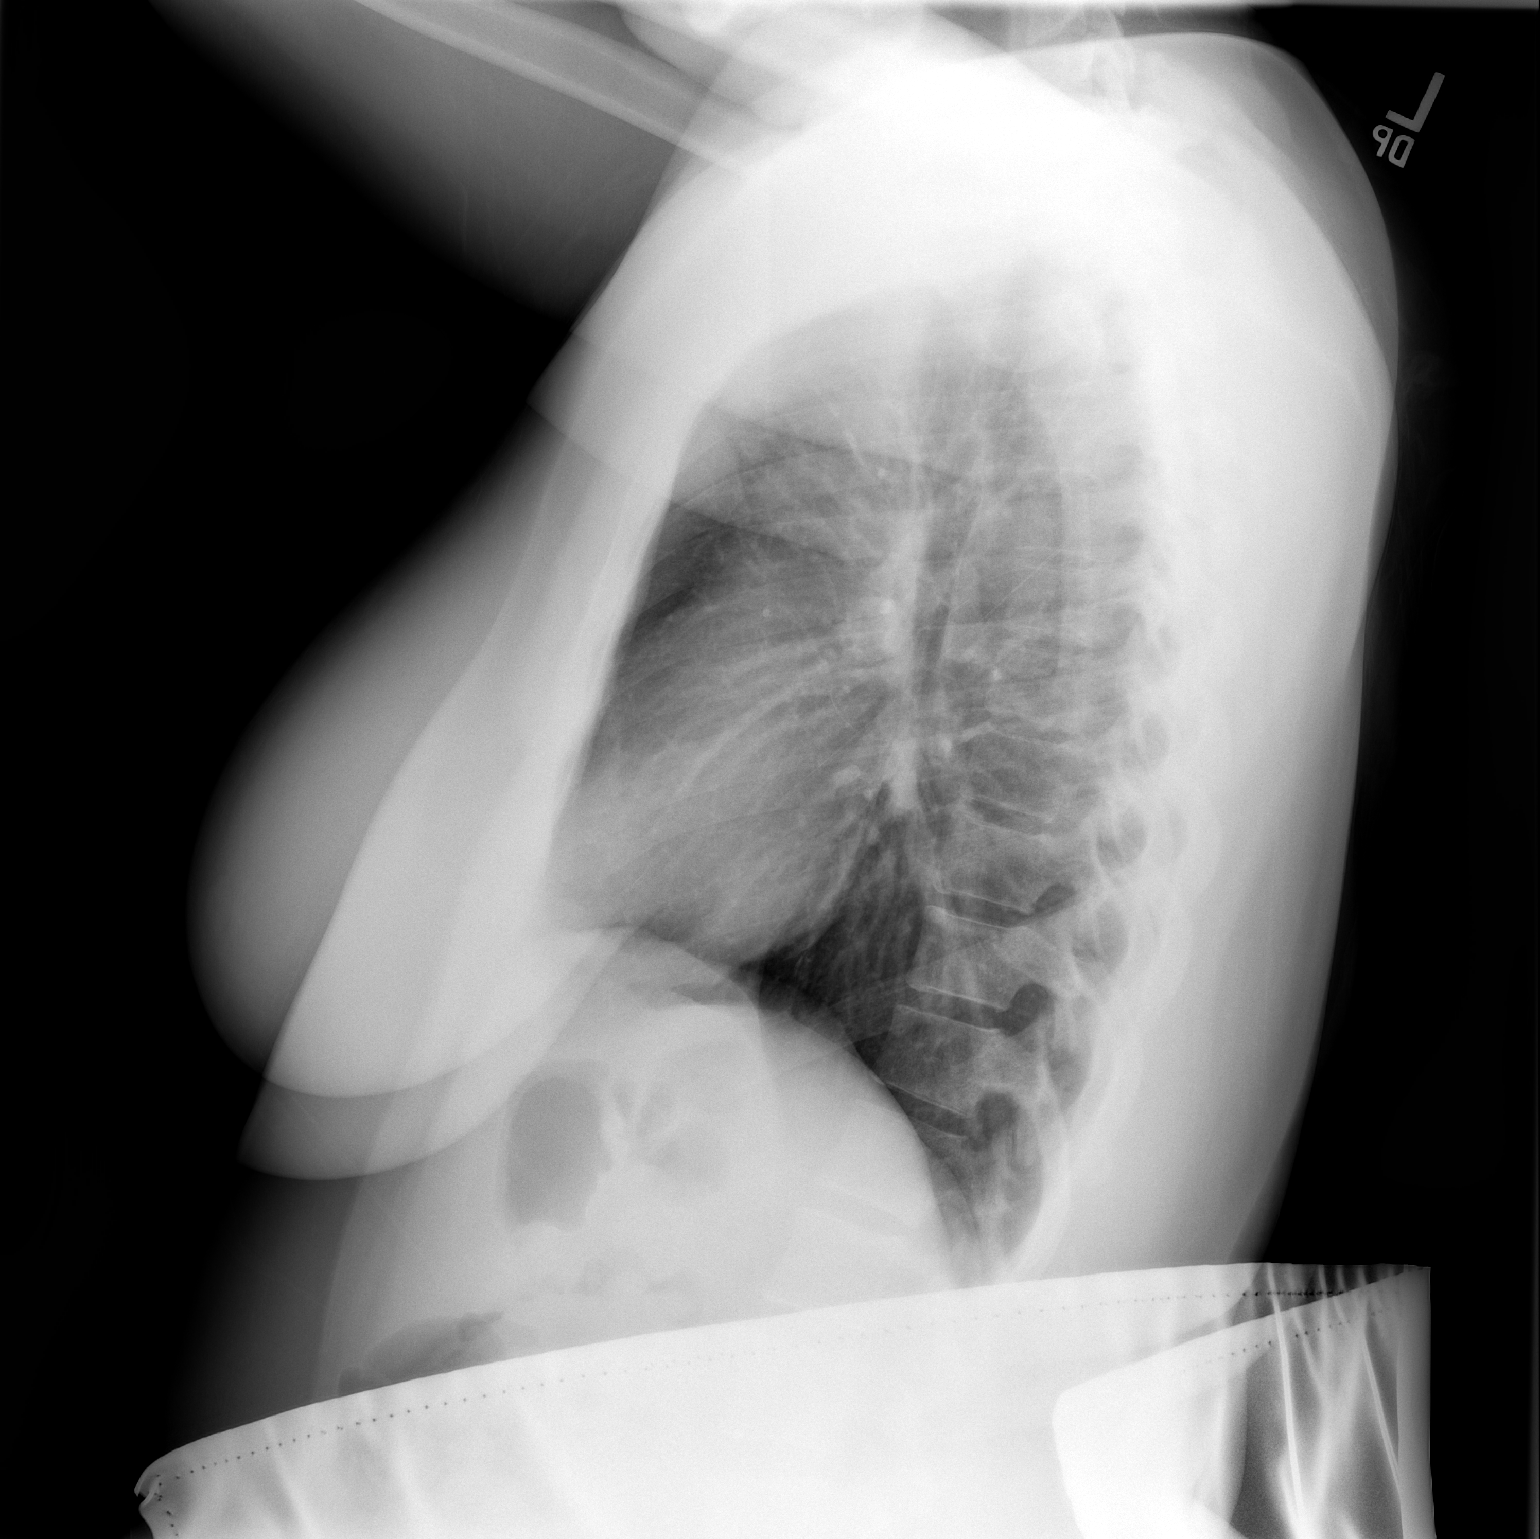

[2 of 2 positions shown; findings below may reference images not displayed]

FINDINGS: The heart size and mediastinal contours are within normal limits.
Both lungs are clear. The visualized skeletal structures are
unremarkable.
IMPRESSION: No active cardiopulmonary disease.

## 2021-03-09 DIAGNOSIS — F411 Generalized anxiety disorder: Secondary | ICD-10-CM | POA: Diagnosis not present

## 2021-03-09 DIAGNOSIS — F339 Major depressive disorder, recurrent, unspecified: Secondary | ICD-10-CM | POA: Diagnosis not present

## 2021-03-09 DIAGNOSIS — F900 Attention-deficit hyperactivity disorder, predominantly inattentive type: Secondary | ICD-10-CM | POA: Diagnosis not present

## 2021-03-12 ENCOUNTER — Ambulatory Visit (INDEPENDENT_AMBULATORY_CARE_PROVIDER_SITE_OTHER): Payer: Federal, State, Local not specified - PPO | Admitting: Psychology

## 2021-03-12 DIAGNOSIS — F411 Generalized anxiety disorder: Secondary | ICD-10-CM | POA: Diagnosis not present

## 2021-03-12 NOTE — Progress Notes (Signed)
° ° ° ° ° ° ° ° ° ° ° ° ° ° °  Angelle Isais Ann Lorraine Cimmino, PhD °

## 2021-03-12 NOTE — Progress Notes (Signed)
Progress Note  Start: 03/12/2021 01:00 PM End:  03/12/2021 02:00 PM Diagnosis unspecified Symptoms Constant or frequent conflict with parents and/or siblings. (Status: maintained) -- No Description Entered  Decrease or loss of appetite. (Status: maintained) -- No Description Entered  Depressed or irritable mood. (Status: maintained) -- No Description Entered  Diminished interest in or enjoyment of activities. (Status: maintained) -- No Description Entered  Displays deficits in parenting knowledge and skills. (Status: maintained) -- No Description Entered  History of chronic or recurrent depression for which the client has taken antidepressant medication, been hospitalized, had outpatient treatment, or had a course of electroconvulsive therapy. (Status: maintained) -- No Description Entered  Lack of energy. (Status: maintained) -- No Description Entered  Lacks knowledge regarding reasonable expectations for a child's behavior at a given developmental level. (Status: maintained) -- No Description Entered  Low self-esteem. (Status: maintained) -- No Description Entered  Psychomotor agitation or retardation. (Status: maintained) -- No Description Entered  Sleeplessness or hypersomnia. (Status: maintained) -- No Description Entered  Social withdrawal. (Status: maintained) -- No Description Entered  Medication Status instructed to contact psychiatrist  compliance  Safety none  If Suicidal or Homicidal State Action Taken: unspecified  Current Risk: low Medications unspecified Objectives Related Problem: Achieve a greater level of family connectedness. Description: Parents and child report an increased feeling of connectedness between them. Target Date: 2021-08-07 Frequency: Biweekly Modality: individual Progress: 70%  Related Problem: Achieve a greater level of family connectedness. Description: Identify unresolved childhood issues that affect parenting and work toward their  resolution. Target Date: 2021-08-07 Frequency: Biweekly Modality: individual Progress: 50% Planned Intervention: Assist the parents in working through issues from their own childhood that are unresolved.  Related Problem: Achieve a greater level of family connectedness. Description: Parents expand repertoire of parenting options. Target Date: 2021-08-07 Frequency: Biweekly Modality: individual Progress: 40% Planned Intervention: Support, empower, monitor, and encourage the parents in implementing new strategies for parenting their child, giving feedback and redirection as needed.  Planned Intervention: Expand the parents' repertoire of intervention options by having them read material on parenting difficult children (e.g., The Difficult Child by Linton Rump and Firefighter; The Explosive Child by Carlota Raspberry; How to Handle a Hard-to-Handle Kid by Juncal).  Related Problem: Achieve a greater level of family connectedness. Description: Develop skills to talk openly and effectively with the children. Target Date: 2021-08-07 Frequency: Biweekly Modality: individual Progress: 50%  Related Problem: Achieve a greater level of family connectedness. Description: Verbalize a sense of increased skill, effectiveness, and confidence in parenting. Target Date: 2021-08-07 Frequency: Biweekly Modality: individual Progress: 50%  Related Problem: Achieve a greater level of family connectedness. Description: Learn and implement parenting practices that have demonstrated effectiveness. Target Date: 2021-08-07 Frequency: Biweekly Modality: individual Progress: 40%  Related Problem: Appropriately grieve the loss in order to normalize mood and to return to previously adaptive level of functioning. Description: Identify and replace thoughts and beliefs that support depression. Target Date: 2021-08-07 Frequency: Biweekly Modality: individual Progress: 60%  Planned Intervention: Facilitate and reinforce the  client's shift from biased depressive self-talk and beliefs to reality-based cognitive messages that enhance self-confidence and increase adaptive actions (see "Positive Self-Talk" in the Adult Psychotherapy Homework Planner by Bryn Gulling).  Planned Intervention: Assign the client to self-monitor thoughts, feelings, and actions in daily journal (e.g., "Negative Thoughts Trigger Negative Feelings" in the Adult Psychotherapy Homework Planner by Bryn Gulling; "Daily Record of Dysfunctional Thoughts" in Cognitive Therapy of Depression by Lupita Shutter and Warren Lacy); process the journal material to challenge depressive thinking  patterns and replace them with reality-based thoughts.  Related Problem: Appropriately grieve the loss in order to normalize mood and to return to previously adaptive level of functioning. Description: Learn and implement behavioral strategies to overcome depression. Target Date: 2021-08-07 Frequency: Biweekly Modality: individual Progress: 50% Planned Intervention: Assist the client in developing skills that increase the likelihood of deriving pleasure from behavioral activation (e.g., assertiveness skills, developing an exercise plan, less internal/more external focus, increased social involvement); reinforce success.  Related Problem: Appropriately grieve the loss in order to normalize mood and to return to previously adaptive level of functioning. Description: Implement mindfulness techniques for relapse prevention. Target Date: 2021-08-07 Frequency: Biweekly Modality: individual Progress: 30%  Planned Intervention: Work to increase the client's new sense of well-being by building his/her personal strengths evident in their progress through therapy (or assign "Acknowledging My Strengths" and/or "What Are My Good Qualities?" in the Adult Psychotherapy Homework Planner by Bryn Gulling).  Related Problem: Appropriately grieve the loss in order to normalize mood and to return to previously  adaptive level of functioning. Description: Learn and implement problem-solving and decision-making skills. Target Date: 2021-08-07 Frequency: Biweekly Modality: individual Progress: 50%  Related Problem: Appropriately grieve the loss in order to normalize mood and to return to previously adaptive level of functioning. Description: Learn and implement conflict resolution skills to resolve interpersonal problems. Target Date: 2021-08-07 Frequency: Biweekly Modality: individual Progress: 50%  Related Problem: Appropriately grieve the loss in order to normalize mood and to return to previously adaptive level of functioning. Description: Increasingly verbalize hopeful and positive statements regarding self, others, and the future. Target Date: 2021-08-07 Frequency: Biweekly Modality: individual Progress: 50%  Related Problem: Decrease the level of present conflict with parents while beginning to let go of or resolving past conflicts with them. Description: Identify own as well as others' role in the family conflicts. Target Date: 2021-08-07 Frequency: Biweekly Modality: individual Progress: 40%  Related Problem: Decrease the level of present conflict with parents while beginning to let go of or resolving past conflicts with them. Description: Describe the conflicts and the causes of conflicts between self and parents. Target Date: 2021-08-07 Frequency: Biweekly Modality: individual Progress: 60%  Client Response full compliance  Service Location Location, [address] [place], [place] [address]  Service Code cpt [phone])  Assess/facilitate readiness to change  Rationally challenge thoughts or beliefs/cognitive restructuring  Facilitate problem solving  Identify automatic thoughts  Normalize/Reframe  Validate/empathize  Identify/label emotions  Mindfulness training  Comments   Katlen Seyer and I met today in remote video (WebEx) individual face to face  psychotherapy as an accommodation to the [place] pandemic.  Distance Site:  Clients Home Originating Site: Dr. 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  I make several inquires to determine contributing factors.  She met with Ladon Applebaum, PA at [place] who increased her Lexapro to 40 mg.  Ladon Applebaum seemed to be evaluating for bipolar disorder.  In session, we also talked about bipolar disorder as it appears on the mood continuum.  I provided psycho education about bipolar disorder, indicators and what to "look for" with the increase in her antidepressant.  We d/e that she is having problems with her eldest son TJ who has been more oppositional at home.  She described that she gets anxious (anticipatory) when it's time to pick him up  at school.  We d/e/p what has been happening in their interactions and relationship and his relationship with his father.  I encouraged her to set up regular family therapy appointments for her and [place] with his therapist to work on issues at home.  I supported her efforts to off set the negative feedback TJ was getting from his father and coached her how to continue to give him (specific) positive feedback.    Home Practice: f/u and see if she is able to rejoin programing classes   Progress: Patients mood states have improved aeb decreased feelings of dread around work, level of dysphoria, irritability, negative thinking and increased motivation to pursue social interactions.  Pt demonstrates increased use of coping strategies and effective parenting skills.   ______________________________  Medications:   [place], [place]  Modafanil 200 mg Wellbutrin 450 mg Lexapro 40 mg B12 Complex Omega 3 Pre-Natal Vitamin Vitamin D + K   Royetta Crochet, PhD

## 2021-03-15 ENCOUNTER — Ambulatory Visit (INDEPENDENT_AMBULATORY_CARE_PROVIDER_SITE_OTHER): Payer: Federal, State, Local not specified - PPO | Admitting: Psychology

## 2021-03-15 DIAGNOSIS — F331 Major depressive disorder, recurrent, moderate: Secondary | ICD-10-CM

## 2021-03-15 NOTE — Progress Notes (Addendum)
Progress Note  Start: 03/15/2021 01:00 PM End:  03/15/2021 02:00 PM Diagnosis unspecified Symptoms Constant or frequent conflict with parents and/or siblings. (Status: maintained) -- No Description Entered  Decrease or loss of appetite. (Status: maintained) -- No Description Entered  Depressed or irritable mood. (Status: maintained) -- No Description Entered  Diminished interest in or enjoyment of activities. (Status: maintained) -- No Description Entered  Displays deficits in parenting knowledge and skills. (Status: maintained) -- No Description Entered  History of chronic or recurrent depression for which the client has taken antidepressant medication, been hospitalized, had outpatient treatment, or had a course of electroconvulsive therapy. (Status: maintained) -- No Description Entered  Lack of energy. (Status: maintained) -- No Description Entered  Lacks knowledge regarding reasonable expectations for a child's behavior at a given developmental level. (Status: maintained) -- No Description Entered  Low self-esteem. (Status: maintained) -- No Description Entered  Psychomotor agitation or retardation. (Status: maintained) -- No Description Entered  Sleeplessness or hypersomnia. (Status: maintained) -- No Description Entered  Social withdrawal. (Status: maintained) -- No Description Entered  Medication Status instructed to contact psychiatrist  compliance  Safety none  If Suicidal or Homicidal State Action Taken: unspecified  Current Risk: low Medications unspecified Objectives Related Problem: Achieve a greater level of family connectedness. Description: Parents and child report an increased feeling of connectedness between them. Target Date: 2021-08-07 Frequency: Biweekly Modality: individual Progress: 70%  Related Problem: Achieve a greater level of family connectedness. Description: Identify unresolved childhood issues that affect parenting and work toward their  resolution. Target Date: 2021-08-07 Frequency: Biweekly Modality: individual Progress: 50% Planned Intervention: Assist the parents in working through issues from their own childhood that are unresolved.  Related Problem: Achieve a greater level of family connectedness. Description: Parents expand repertoire of parenting options. Target Date: 2021-08-07 Frequency: Biweekly Modality: individual Progress: 40% Planned Intervention: Support, empower, monitor, and encourage the parents in implementing new strategies for parenting their child, giving feedback and redirection as needed.  Planned Intervention: Expand the parents' repertoire of intervention options by having them read material on parenting difficult children (e.g., The Difficult Child by Linton Rump and Firefighter; The Explosive Child by Carlota Raspberry; How to Handle a Hard-to-Handle Kid by [place]).  Related Problem: Achieve a greater level of family connectedness. Description: Develop skills to talk openly and effectively with the children. Target Date: 2021-08-07 Frequency: Biweekly Modality: individual Progress: 50%  Related Problem: Achieve a greater level of family connectedness. Description: Verbalize a sense of increased skill, effectiveness, and confidence in parenting. Target Date: 2021-08-07 Frequency: Biweekly Modality: individual Progress: 50%  Related Problem: Achieve a greater level of family connectedness. Description: Learn and implement parenting practices that have demonstrated effectiveness. Target Date: 2021-08-07 Frequency: Biweekly Modality: individual Progress: 40%  Related Problem: Appropriately grieve the loss in order to normalize mood and to return to previously adaptive level of functioning. Description: Identify and replace thoughts and beliefs that support depression. Target Date: 2021-08-07 Frequency: Biweekly Modality: individual Progress: 60%  Planned Intervention: Facilitate and reinforce the  client's shift from biased depressive self-talk and beliefs to reality-based cognitive messages that enhance self-confidence and increase adaptive actions (see "Positive Self-Talk" in the Adult Psychotherapy Homework Planner by Bryn Gulling).  Planned Intervention: Assign the client to self-monitor thoughts, feelings, and actions in daily journal (e.g., "Negative Thoughts Trigger Negative Feelings" in the Adult Psychotherapy Homework Planner by Bryn Gulling; "Daily Record of Dysfunctional Thoughts" in Cognitive Therapy of Depression by Lupita Shutter and Warren Lacy); process the journal material to challenge depressive thinking  patterns and replace them with reality-based thoughts.  Related Problem: Appropriately grieve the loss in order to normalize mood and to return to previously adaptive level of functioning. Description: Learn and implement behavioral strategies to overcome depression. Target Date: 2021-08-07 Frequency: Biweekly Modality: individual Progress: 50% Planned Intervention: Assist the client in developing skills that increase the likelihood of deriving pleasure from behavioral activation (e.g., assertiveness skills, developing an exercise plan, less internal/more external focus, increased social involvement); reinforce success.  Related Problem: Appropriately grieve the loss in order to normalize mood and to return to previously adaptive level of functioning. Description: Implement mindfulness techniques for relapse prevention. Target Date: 2021-08-07 Frequency: Biweekly Modality: individual Progress: 30%  Planned Intervention: Work to increase the client's new sense of well-being by building his/her personal strengths evident in their progress through therapy (or assign "Acknowledging My Strengths" and/or "What Are My Good Qualities?" in the Adult Psychotherapy Homework Planner by Bryn Gulling).  Related Problem: Appropriately grieve the loss in order to normalize mood and to return to previously  adaptive level of functioning. Description: Learn and implement problem-solving and decision-making skills. Target Date: 2021-08-07 Frequency: Biweekly Modality: individual Progress: 50%  Related Problem: Appropriately grieve the loss in order to normalize mood and to return to previously adaptive level of functioning. Description: Learn and implement conflict resolution skills to resolve interpersonal problems. Target Date: 2021-08-07 Frequency: Biweekly Modality: individual Progress: 50%  Related Problem: Appropriately grieve the loss in order to normalize mood and to return to previously adaptive level of functioning. Description: Increasingly verbalize hopeful and positive statements regarding self, others, and the future. Target Date: 2021-08-07 Frequency: Biweekly Modality: individual Progress: 50%  Related Problem: Decrease the level of present conflict with parents while beginning to let go of or resolving past conflicts with them. Description: Identify own as well as others' role in the family conflicts. Target Date: 2021-08-07 Frequency: Biweekly Modality: individual Progress: 40%  Related Problem: Decrease the level of present conflict with parents while beginning to let go of or resolving past conflicts with them. Description: Describe the conflicts and the causes of conflicts between self and parents. Target Date: 2021-08-07 Frequency: Biweekly Modality: individual Progress: 60%  Client Response full compliance  Service Location Location, [address] [place], [place] [address]  Service Code cpt [phone])  Assess/facilitate readiness to change  Rationally challenge thoughts or beliefs/cognitive restructuring  Facilitate problem solving  Identify automatic thoughts  Normalize/Reframe  Validate/empathize  Identify/label emotions  Mindfulness training  Comments   Laylynn Campanella and I met today in remote video (WebEx) individual face to face  psychotherapy as an accommodation to the [place] pandemic.  Distance Site:  Clients Home Originating Site: Dr. 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  She was behind in doing the laundry and is slow to do other household tasks.  We were then able to talk ab there feeling tired about being tired.  I walked her through the distortion, how to use her self talk and practice using her self talk statements to break through the distortion and encouraged her to not "indulge" the negative self talk.  I then coached her through how to breakdown bigger tasks into smaller more doable tasks  and strategies to help with initiation.      Home Practice: f/u and see if she is able to rejoin programing classes   Progress: Patients mood states have improved aeb decreased feelings of dread around work, level of dysphoria, irritability, negative thinking and increased motivation to pursue social interactions.  Pt demonstrates increased use of coping strategies and effective parenting skills.   ______________________________  Medications:   [place], [place]  Modafanil 200 mg Wellbutrin 450 mg Lexapro 40 mg B12 Complex Omega 3 Pre-Natal Vitamin Vitamin D + K     Royetta Crochet, PhD

## 2021-03-19 ENCOUNTER — Other Ambulatory Visit: Payer: Self-pay | Admitting: Medical

## 2021-03-19 DIAGNOSIS — J4530 Mild persistent asthma, uncomplicated: Secondary | ICD-10-CM

## 2021-03-20 ENCOUNTER — Ambulatory Visit (INDEPENDENT_AMBULATORY_CARE_PROVIDER_SITE_OTHER): Payer: Federal, State, Local not specified - PPO | Admitting: Psychology

## 2021-03-20 DIAGNOSIS — F411 Generalized anxiety disorder: Secondary | ICD-10-CM

## 2021-03-20 NOTE — Progress Notes (Signed)
Progress Note  Start: 03/20/2021 12:00 PM End:  03/20/2021 12:55 PM Diagnosis unspecified Symptoms Constant or frequent conflict with parents and/or siblings. (Status: maintained) -- No Description Entered  Decrease or loss of appetite. (Status: maintained) -- No Description Entered  Depressed or irritable mood. (Status: maintained) -- No Description Entered  Diminished interest in or enjoyment of activities. (Status: maintained) -- No Description Entered  Displays deficits in parenting knowledge and skills. (Status: maintained) -- No Description Entered  History of chronic or recurrent depression for which the client has taken antidepressant medication, been hospitalized, had outpatient treatment, or had a course of electroconvulsive therapy. (Status: maintained) -- No Description Entered  Lack of energy. (Status: maintained) -- No Description Entered  Lacks knowledge regarding reasonable expectations for a child's behavior at a given developmental level. (Status: maintained) -- No Description Entered  Low self-esteem. (Status: maintained) -- No Description Entered  Psychomotor agitation or retardation. (Status: maintained) -- No Description Entered  Sleeplessness or hypersomnia. (Status: maintained) -- No Description Entered  Social withdrawal. (Status: maintained) -- No Description Entered  Medication Status instructed to contact psychiatrist  compliance  Safety none  If Suicidal or Homicidal State Action Taken: unspecified  Current Risk: low Medications unspecified Objectives Related Problem: Achieve a greater level of family connectedness. Description: Parents and child report an increased feeling of connectedness between them. Target Date: 2021-08-07 Frequency: Biweekly Modality: individual Progress: 70%  Related Problem: Achieve a greater level of family connectedness. Description: Identify unresolved childhood issues that affect parenting and work toward their  resolution. Target Date: 2021-08-07 Frequency: Biweekly Modality: individual Progress: 50% Planned Intervention: Assist the parents in working through issues from their own childhood that are unresolved.  Related Problem: Achieve a greater level of family connectedness. Description: Parents expand repertoire of parenting options. Target Date: 2021-08-07 Frequency: Biweekly Modality: individual Progress: 40% Planned Intervention: Support, empower, monitor, and encourage the parents in implementing new strategies for parenting their child, giving feedback and redirection as needed.  Planned Intervention: Expand the parents' repertoire of intervention options by having them read material on parenting difficult children (e.g., The Difficult Child by Linton Rump and Firefighter; The Explosive Child by Carlota Raspberry; How to Handle a Hard-to-Handle Kid by [place]).  Related Problem: Achieve a greater level of family connectedness. Description: Develop skills to talk openly and effectively with the children. Target Date: 2021-08-07 Frequency: Biweekly Modality: individual Progress: 50%  Related Problem: Achieve a greater level of family connectedness. Description: Verbalize a sense of increased skill, effectiveness, and confidence in parenting. Target Date: 2021-08-07 Frequency: Biweekly Modality: individual Progress: 50%  Related Problem: Achieve a greater level of family connectedness. Description: Learn and implement parenting practices that have demonstrated effectiveness. Target Date: 2021-08-07 Frequency: Biweekly Modality: individual Progress: 40%  Related Problem: Appropriately grieve the loss in order to normalize mood and to return to previously adaptive level of functioning. Description: Identify and replace thoughts and beliefs that support depression. Target Date: 2021-08-07 Frequency: Biweekly Modality: individual Progress: 60%  Planned Intervention: Facilitate and reinforce the  client's shift from biased depressive self-talk and beliefs to reality-based cognitive messages that enhance self-confidence and increase adaptive actions (see "Positive Self-Talk" in the Adult Psychotherapy Homework Planner by Bryn Gulling).  Planned Intervention: Assign the client to self-monitor thoughts, feelings, and actions in daily journal (e.g., "Negative Thoughts Trigger Negative Feelings" in the Adult Psychotherapy Homework Planner by Bryn Gulling; "Daily Record of Dysfunctional Thoughts" in Cognitive Therapy of Depression by Lupita Shutter and Warren Lacy); process the journal material to challenge depressive thinking  patterns and replace them with reality-based thoughts.  Related Problem: Appropriately grieve the loss in order to normalize mood and to return to previously adaptive level of functioning. Description: Learn and implement behavioral strategies to overcome depression. Target Date: 2021-08-07 Frequency: Biweekly Modality: individual Progress: 50% Planned Intervention: Assist the client in developing skills that increase the likelihood of deriving pleasure from behavioral activation (e.g., assertiveness skills, developing an exercise plan, less internal/more external focus, increased social involvement); reinforce success.  Related Problem: Appropriately grieve the loss in order to normalize mood and to return to previously adaptive level of functioning. Description: Implement mindfulness techniques for relapse prevention. Target Date: 2021-08-07 Frequency: Biweekly Modality: individual Progress: 30%  Planned Intervention: Work to increase the client's new sense of well-being by building his/her personal strengths evident in their progress through therapy (or assign "Acknowledging My Strengths" and/or "What Are My Good Qualities?" in the Adult Psychotherapy Homework Planner by Bryn Gulling).  Related Problem: Appropriately grieve the loss in order to normalize mood and to return to previously  adaptive level of functioning. Description: Learn and implement problem-solving and decision-making skills. Target Date: 2021-08-07 Frequency: Biweekly Modality: individual Progress: 50%  Related Problem: Appropriately grieve the loss in order to normalize mood and to return to previously adaptive level of functioning. Description: Learn and implement conflict resolution skills to resolve interpersonal problems. Target Date: 2021-08-07 Frequency: Biweekly Modality: individual Progress: 50%  Related Problem: Appropriately grieve the loss in order to normalize mood and to return to previously adaptive level of functioning. Description: Increasingly verbalize hopeful and positive statements regarding self, others, and the future. Target Date: 2021-08-07 Frequency: Biweekly Modality: individual Progress: 50%  Related Problem: Decrease the level of present conflict with parents while beginning to let go of or resolving past conflicts with them. Description: Identify own as well as others' role in the family conflicts. Target Date: 2021-08-07 Frequency: Biweekly Modality: individual Progress: 40%  Related Problem: Decrease the level of present conflict with parents while beginning to let go of or resolving past conflicts with them. Description: Describe the conflicts and the causes of conflicts between self and parents. Target Date: 2021-08-07 Frequency: Biweekly Modality: individual Progress: 60%  Client Response full compliance  Service Location Location, [address] [place], [place] [address]  Service Code cpt [phone])  Assess/facilitate readiness to change  Rationally challenge thoughts or beliefs/cognitive restructuring  Facilitate problem solving  Identify automatic thoughts  Normalize/Reframe  Validate/empathize  Identify/label emotions  Mindfulness training Time Management/ Organizational Skills Comments   Amberia Bayless and I met today in remote video  (WebEx) individual face to face psychotherapy as an accommodation to the [place] pandemic.  Distance Site:  Clients Home Originating Site: Dr. 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  They attempted Propanolol but it activated her asthma.  They then switched to Atenolol and this was more effective.  We d/ how the combination of the new medication which helped to manage her anxiety and the time management skills we d/ made a big difference.  We reviewed time management and organization skills and how these can break through the depression and anxiety.  Most importantly, we d/e her negative and self defeating self talk, how to challenge these thoughts and reinforced that she has more control over her life than she thinks.  Home Practice: pull 2-3 times off master to do list, estimate time and execute in small steps  Progress: Patients mood states have improved aeb decreased feelings of dread around work, level of dysphoria, irritability, negative thinking and increased motivation to pursue social interactions.  Pt demonstrates increased use of coping strategies and effective parenting skills.   ______________________________ Royetta Crochet, PhD   Medications:   [place], [place], [place], [place]  Atenolol 25 mg Modafanil 200 mg Wellbutrin 450 mg Lexapro 40 mg B12 Complex Omega 3 Pre-Natal Vitamin Vitamin D + K

## 2021-03-22 ENCOUNTER — Ambulatory Visit (INDEPENDENT_AMBULATORY_CARE_PROVIDER_SITE_OTHER): Payer: Federal, State, Local not specified - PPO | Admitting: Psychology

## 2021-03-22 DIAGNOSIS — F331 Major depressive disorder, recurrent, moderate: Secondary | ICD-10-CM | POA: Diagnosis not present

## 2021-03-22 DIAGNOSIS — F411 Generalized anxiety disorder: Secondary | ICD-10-CM | POA: Diagnosis not present

## 2021-03-22 NOTE — Progress Notes (Signed)
Progress Note  Start: 03/22/2021 12:00 PM End:  03/22/2021 12:55 PM Diagnosis unspecified Symptoms Constant or frequent conflict with parents and/or siblings. (Status: maintained) -- No Description Entered  Decrease or loss of appetite. (Status: maintained) -- No Description Entered  Depressed or irritable mood. (Status: maintained) -- No Description Entered  Diminished interest in or enjoyment of activities. (Status: maintained) -- No Description Entered  Displays deficits in parenting knowledge and skills. (Status: maintained) -- No Description Entered  History of chronic or recurrent depression for which the client has taken antidepressant medication, been hospitalized, had outpatient treatment, or had a course of electroconvulsive therapy. (Status: maintained) -- No Description Entered  Lack of energy. (Status: maintained) -- No Description Entered  Lacks knowledge regarding reasonable expectations for a child's behavior at a given developmental level. (Status: maintained) -- No Description Entered  Low self-esteem. (Status: maintained) -- No Description Entered  Psychomotor agitation or retardation. (Status: maintained) -- No Description Entered  Sleeplessness or hypersomnia. (Status: maintained) -- No Description Entered  Social withdrawal. (Status: maintained) -- No Description Entered  Medication Status instructed to contact psychiatrist  compliance  Safety none  If Suicidal or Homicidal State Action Taken: unspecified  Current Risk: low Medications unspecified Objectives Related Problem: Achieve a greater level of family connectedness. Description: Parents and child report an increased feeling of connectedness between them. Target Date: 2021-08-07 Frequency: Biweekly Modality: individual Progress: 70%  Related Problem: Achieve a greater level of family connectedness. Description: Identify unresolved childhood issues that affect parenting and work toward their  resolution. Target Date: 2021-08-07 Frequency: Biweekly Modality: individual Progress: 50% Planned Intervention: Assist the parents in working through issues from their own childhood that are unresolved.  Related Problem: Achieve a greater level of family connectedness. Description: Parents expand repertoire of parenting options. Target Date: 2021-08-07 Frequency: Biweekly Modality: individual Progress: 40% Planned Intervention: Support, empower, monitor, and encourage the parents in implementing new strategies for parenting their child, giving feedback and redirection as needed.  Planned Intervention: Expand the parents' repertoire of intervention options by having them read material on parenting difficult children (e.g., The Difficult Child by Linton Rump and Firefighter; The Explosive Child by Carlota Raspberry; How to Handle a Hard-to-Handle Kid by [place]).  Related Problem: Achieve a greater level of family connectedness. Description: Develop skills to talk openly and effectively with the children. Target Date: 2021-08-07 Frequency: Biweekly Modality: individual Progress: 50%  Related Problem: Achieve a greater level of family connectedness. Description: Verbalize a sense of increased skill, effectiveness, and confidence in parenting. Target Date: 2021-08-07 Frequency: Biweekly Modality: individual Progress: 50%  Related Problem: Achieve a greater level of family connectedness. Description: Learn and implement parenting practices that have demonstrated effectiveness. Target Date: 2021-08-07 Frequency: Biweekly Modality: individual Progress: 40%  Related Problem: Appropriately grieve the loss in order to normalize mood and to return to previously adaptive level of functioning. Description: Identify and replace thoughts and beliefs that support depression. Target Date: 2021-08-07 Frequency: Biweekly Modality: individual Progress: 60%  Planned Intervention: Facilitate and reinforce the  client's shift from biased depressive self-talk and beliefs to reality-based cognitive messages that enhance self-confidence and increase adaptive actions (see "Positive Self-Talk" in the Adult Psychotherapy Homework Planner by Bryn Gulling).  Planned Intervention: Assign the client to self-monitor thoughts, feelings, and actions in daily journal (e.g., "Negative Thoughts Trigger Negative Feelings" in the Adult Psychotherapy Homework Planner by Bryn Gulling; "Daily Record of Dysfunctional Thoughts" in Cognitive Therapy of Depression by Lupita Shutter and Warren Lacy); process the journal material to challenge depressive thinking  patterns and replace them with reality-based thoughts.  Related Problem: Appropriately grieve the loss in order to normalize mood and to return to previously adaptive level of functioning. Description: Learn and implement behavioral strategies to overcome depression. Target Date: 2021-08-07 Frequency: Biweekly Modality: individual Progress: 50% Planned Intervention: Assist the client in developing skills that increase the likelihood of deriving pleasure from behavioral activation (e.g., assertiveness skills, developing an exercise plan, less internal/more external focus, increased social involvement); reinforce success.  Related Problem: Appropriately grieve the loss in order to normalize mood and to return to previously adaptive level of functioning. Description: Implement mindfulness techniques for relapse prevention. Target Date: 2021-08-07 Frequency: Biweekly Modality: individual Progress: 30%  Planned Intervention: Work to increase the client's new sense of well-being by building his/her personal strengths evident in their progress through therapy (or assign "Acknowledging My Strengths" and/or "What Are My Good Qualities?" in the Adult Psychotherapy Homework Planner by Bryn Gulling).  Related Problem: Appropriately grieve the loss in order to normalize mood and to return to previously  adaptive level of functioning. Description: Learn and implement problem-solving and decision-making skills. Target Date: 2021-08-07 Frequency: Biweekly Modality: individual Progress: 50%  Related Problem: Appropriately grieve the loss in order to normalize mood and to return to previously adaptive level of functioning. Description: Learn and implement conflict resolution skills to resolve interpersonal problems. Target Date: 2021-08-07 Frequency: Biweekly Modality: individual Progress: 50%  Related Problem: Appropriately grieve the loss in order to normalize mood and to return to previously adaptive level of functioning. Description: Increasingly verbalize hopeful and positive statements regarding self, others, and the future. Target Date: 2021-08-07 Frequency: Biweekly Modality: individual Progress: 50%  Related Problem: Decrease the level of present conflict with parents while beginning to let go of or resolving past conflicts with them. Description: Identify own as well as others' role in the family conflicts. Target Date: 2021-08-07 Frequency: Biweekly Modality: individual Progress: 40%  Related Problem: Decrease the level of present conflict with parents while beginning to let go of or resolving past conflicts with them. Description: Describe the conflicts and the causes of conflicts between self and parents. Target Date: 2021-08-07 Frequency: Biweekly Modality: individual Progress: 60%  Client Response full compliance  Service Location Location, [address] [place], [place] [address]  Service Code cpt [phone])  Assess/facilitate readiness to change  Rationally challenge thoughts or beliefs/cognitive restructuring  Facilitate problem solving  Identify automatic thoughts  Normalize/Reframe  Validate/empathize  Identify/label emotions  Mindfulness training Time Management/ Organizational Skills Comments   Kennidee Heyne and I met today in remote video  (WebEx) individual face to face psychotherapy as an accommodation to the [place] pandemic.  Distance Site:  Clients Home Originating Site: Dr. 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  She reports continued periodic anxiety attacks.  We d/ the skills she implemented to manage her anxiety and the extent to which they were helped.  I noted that I didn't think that would be the best idea at this time and outlined the reasons for this conclusion.  I reminded her that her work personal made things much more stressful for he the last time she attempted this and that I felt it would give her too much free time which might only trigger more negative/anxious  ruminations.  We talked about the nature of anxiety, how it is complicated by PMDD and the need to adjust her medication.  I offered some suggestions for her to d/ with Ladon Applebaum regarding her medication include d/ the possibility of using an oral contraceptive to help manage her PMDD.      Home Practice: pull 2-3 times off master to do list, estimate time and execute in small steps  Progress: Patients mood states have improved aeb decreased feelings of dread around work, level of dysphoria, irritability, negative thinking and increased motivation to pursue social interactions.  Pt demonstrates increased use of coping strategies and effective parenting skills.   ______________________________    Medications:   [place], [place], [employer], [place]   Atenolol 25 mg PRN Wellbutrin 450 mg Lexapro 40 mg B12 Complex Omega 3 Pre-Natal Vitamin Vitamin D + K   Royetta Crochet, PhD

## 2021-03-26 ENCOUNTER — Ambulatory Visit (INDEPENDENT_AMBULATORY_CARE_PROVIDER_SITE_OTHER): Payer: Federal, State, Local not specified - PPO | Admitting: Psychology

## 2021-03-26 DIAGNOSIS — F411 Generalized anxiety disorder: Secondary | ICD-10-CM

## 2021-03-26 NOTE — Progress Notes (Signed)
Progress Note  Start: 03/26/2021 1:00 PM End:  03/26/2021 1:55 PM Diagnosis unspecified Symptoms Constant or frequent conflict with parents and/or siblings. (Status: maintained) -- No Description Entered  Decrease or loss of appetite. (Status: maintained) -- No Description Entered  Depressed or irritable mood. (Status: maintained) -- No Description Entered  Diminished interest in or enjoyment of activities. (Status: maintained) -- No Description Entered  Displays deficits in parenting knowledge and skills. (Status: maintained) -- No Description Entered  History of chronic or recurrent depression for which the client has taken antidepressant medication, been hospitalized, had outpatient treatment, or had a course of electroconvulsive therapy. (Status: maintained) -- No Description Entered  Lack of energy. (Status: maintained) -- No Description Entered  Lacks knowledge regarding reasonable expectations for a child's behavior at a given developmental level. (Status: maintained) -- No Description Entered  Low self-esteem. (Status: maintained) -- No Description Entered  Psychomotor agitation or retardation. (Status: maintained) -- No Description Entered  Sleeplessness or hypersomnia. (Status: maintained) -- No Description Entered  Social withdrawal. (Status: maintained) -- No Description Entered  Medication Status instructed to contact psychiatrist  compliance  Safety none  If Suicidal or Homicidal State Action Taken: unspecified  Current Risk: low Medications unspecified Objectives Related Problem: Achieve a greater level of family connectedness. Description: Parents and child report an increased feeling of connectedness between them. Target Date: 2021-08-07 Frequency: Biweekly Modality: individual Progress: 70%  Related Problem: Achieve a greater level of family connectedness. Description: Identify unresolved childhood issues that affect parenting and work toward their  resolution. Target Date: 2021-08-07 Frequency: Biweekly Modality: individual Progress: 50% Planned Intervention: Assist the parents in working through issues from their own childhood that are unresolved.  Related Problem: Achieve a greater level of family connectedness. Description: Parents expand repertoire of parenting options. Target Date: 2021-08-07 Frequency: Biweekly Modality: individual Progress: 40% Planned Intervention: Support, empower, monitor, and encourage the parents in implementing new strategies for parenting their child, giving feedback and redirection as needed.  Planned Intervention: Expand the parents' repertoire of intervention options by having them read material on parenting difficult children (e.g., The Difficult Child by Linton Rump and Firefighter; The Explosive Child by Carlota Raspberry; How to Handle a Hard-to-Handle Kid by [place]).  Related Problem: Achieve a greater level of family connectedness. Description: Develop skills to talk openly and effectively with the children. Target Date: 2021-08-07 Frequency: Biweekly Modality: individual Progress: 50%  Related Problem: Achieve a greater level of family connectedness. Description: Verbalize a sense of increased skill, effectiveness, and confidence in parenting. Target Date: 2021-08-07 Frequency: Biweekly Modality: individual Progress: 50%  Related Problem: Achieve a greater level of family connectedness. Description: Learn and implement parenting practices that have demonstrated effectiveness. Target Date: 2021-08-07 Frequency: Biweekly Modality: individual Progress: 40%  Related Problem: Appropriately grieve the loss in order to normalize mood and to return to previously adaptive level of functioning. Description: Identify and replace thoughts and beliefs that support depression. Target Date: 2021-08-07 Frequency: Biweekly Modality: individual Progress: 60%  Planned Intervention: Facilitate and reinforce the  client's shift from biased depressive self-talk and beliefs to reality-based cognitive messages that enhance self-confidence and increase adaptive actions (see "Positive Self-Talk" in the Adult Psychotherapy Homework Planner by Bryn Gulling).  Planned Intervention: Assign the client to self-monitor thoughts, feelings, and actions in daily journal (e.g., "Negative Thoughts Trigger Negative Feelings" in the Adult Psychotherapy Homework Planner by Bryn Gulling; "Daily Record of Dysfunctional Thoughts" in Cognitive Therapy of Depression by Lupita Shutter and Warren Lacy); process the journal material to challenge depressive thinking  patterns and replace them with reality-based thoughts.  Related Problem: Appropriately grieve the loss in order to normalize mood and to return to previously adaptive level of functioning. Description: Learn and implement behavioral strategies to overcome depression. Target Date: 2021-08-07 Frequency: Biweekly Modality: individual Progress: 50% Planned Intervention: Assist the client in developing skills that increase the likelihood of deriving pleasure from behavioral activation (e.g., assertiveness skills, developing an exercise plan, less internal/more external focus, increased social involvement); reinforce success.  Related Problem: Appropriately grieve the loss in order to normalize mood and to return to previously adaptive level of functioning. Description: Implement mindfulness techniques for relapse prevention. Target Date: 2021-08-07 Frequency: Biweekly Modality: individual Progress: 30%  Planned Intervention: Work to increase the client's new sense of well-being by building his/her personal strengths evident in their progress through therapy (or assign "Acknowledging My Strengths" and/or "What Are My Good Qualities?" in the Adult Psychotherapy Homework Planner by Bryn Gulling).  Related Problem: Appropriately grieve the loss in order to normalize mood and to return to previously  adaptive level of functioning. Description: Learn and implement problem-solving and decision-making skills. Target Date: 2021-08-07 Frequency: Biweekly Modality: individual Progress: 50%  Related Problem: Appropriately grieve the loss in order to normalize mood and to return to previously adaptive level of functioning. Description: Learn and implement conflict resolution skills to resolve interpersonal problems. Target Date: 2021-08-07 Frequency: Biweekly Modality: individual Progress: 50%  Related Problem: Appropriately grieve the loss in order to normalize mood and to return to previously adaptive level of functioning. Description: Increasingly verbalize hopeful and positive statements regarding self, others, and the future. Target Date: 2021-08-07 Frequency: Biweekly Modality: individual Progress: 50%  Related Problem: Decrease the level of present conflict with parents while beginning to let go of or resolving past conflicts with them. Description: Identify own as well as others' role in the family conflicts. Target Date: 2021-08-07 Frequency: Biweekly Modality: individual Progress: 40%  Related Problem: Decrease the level of present conflict with parents while beginning to let go of or resolving past conflicts with them. Description: Describe the conflicts and the causes of conflicts between self and parents. Target Date: 2021-08-07 Frequency: Biweekly Modality: individual Progress: 60%  Client Response full compliance  Service Location Location, [address] [place], [place] [address]  Service Code cpt [phone])  Assess/facilitate readiness to change  Rationally challenge thoughts or beliefs/cognitive restructuring  Facilitate problem solving  Identify automatic thoughts  Normalize/Reframe  Validate/empathize  Identify/label emotions  Mindfulness training Time Management/ Organizational Skills Comments   Levern Kalka and I met today in remote video  (WebEx) individual face to face psychotherapy as an accommodation to the [place] pandemic.  Distance Site:  Clients Home Originating Site: Dr. Elinor Dodge Remote Office Consent: Obtained verbal consent to transmit session remotely  PCP:  Dr Ortencia Kick reports that Ladon Applebaum increased her Atenonol to 50 mg and it helped to manage her anxiety attacks.  She downloaded the app I suggested to monitor her moods, identify her feelings and provides coping strategies.  We d/ that using the How-We-Feel app has helped her to reframe her feelings, how she sees an experience.  I provided psycho education about depression and a negative mind set and the need to be intentional in challenging her negative thoughts.  We also d/ exercise, it benefits for depression and how to set herself up for success when her motivtaion is flagging.  Home Practice: pull 2-3 times off master to do list, estimate time and execute in  small steps   Progress: Patients mood states have improved aeb decreased feelings of dread around work, level of dysphoria, irritability, negative thinking and increased motivation to pursue social interactions.  Pt demonstrates increased use of coping strategies and effective parenting skills.   ______________________________    Medications:   [place], [place], [place], [place]   Atenolol 50 mg PRN Wellbutrin 450 mg Lexapro 40 mg B12 Complex Omega 3 Pre-Natal Vitamin Vitamin D + K   Royetta Crochet, PhD

## 2021-04-03 DIAGNOSIS — F411 Generalized anxiety disorder: Secondary | ICD-10-CM | POA: Diagnosis not present

## 2021-04-03 DIAGNOSIS — F339 Major depressive disorder, recurrent, unspecified: Secondary | ICD-10-CM | POA: Diagnosis not present

## 2021-04-09 ENCOUNTER — Ambulatory Visit (INDEPENDENT_AMBULATORY_CARE_PROVIDER_SITE_OTHER): Payer: Federal, State, Local not specified - PPO | Admitting: Psychology

## 2021-04-09 DIAGNOSIS — F411 Generalized anxiety disorder: Secondary | ICD-10-CM

## 2021-04-09 NOTE — Progress Notes (Signed)
Progress Note  Start: 04/09/2021 1:00 PM End:  04/09/2021 1:55 PM Diagnosis unspecified Symptoms Constant or frequent conflict with parents and/or siblings. (Status: maintained) -- No Description Entered  Decrease or loss of appetite. (Status: maintained) -- No Description Entered  Depressed or irritable mood. (Status: maintained) -- No Description Entered  Diminished interest in or enjoyment of activities. (Status: maintained) -- No Description Entered  Displays deficits in parenting knowledge and skills. (Status: maintained) -- No Description Entered  History of chronic or recurrent depression for which the client has taken antidepressant medication, been hospitalized, had outpatient treatment, or had a course of electroconvulsive therapy. (Status: maintained) -- No Description Entered  Lack of energy. (Status: maintained) -- No Description Entered  Lacks knowledge regarding reasonable expectations for a child's behavior at a given developmental level. (Status: maintained) -- No Description Entered  Low self-esteem. (Status: maintained) -- No Description Entered  Psychomotor agitation or retardation. (Status: maintained) -- No Description Entered  Sleeplessness or hypersomnia. (Status: maintained) -- No Description Entered  Social withdrawal. (Status: maintained) -- No Description Entered  Medication Status instructed to contact psychiatrist  compliance  Safety none  If Suicidal or Homicidal State Action Taken: unspecified  Current Risk: low Medications unspecified Objectives Related Problem: Achieve a greater level of family connectedness. Description: Parents and child report an increased feeling of connectedness between them. Target Date: 2021-08-07 Frequency: Biweekly Modality: individual Progress: 70%  Related Problem: Achieve a greater level of family connectedness. Description: Identify unresolved childhood issues that affect parenting and work toward their  resolution. Target Date: 2021-08-07 Frequency: Biweekly Modality: individual Progress: 50% Planned Intervention: Assist the parents in working through issues from their own childhood that are unresolved.  Related Problem: Achieve a greater level of family connectedness. Description: Parents expand repertoire of parenting options. Target Date: 2021-08-07 Frequency: Biweekly Modality: individual Progress: 40% Planned Intervention: Support, empower, monitor, and encourage the parents in implementing new strategies for parenting their child, giving feedback and redirection as needed.  Planned Intervention: Expand the parents' repertoire of intervention options by having them read material on parenting difficult children (e.g., The Difficult Child by Linton Rump and Firefighter; The Explosive Child by Carlota Raspberry; How to Handle a Hard-to-Handle Kid by [place]).  Related Problem: Achieve a greater level of family connectedness. Description: Develop skills to talk openly and effectively with the children. Target Date: 2021-08-07 Frequency: Biweekly Modality: individual Progress: 50%  Related Problem: Achieve a greater level of family connectedness. Description: Verbalize a sense of increased skill, effectiveness, and confidence in parenting. Target Date: 2021-08-07 Frequency: Biweekly Modality: individual Progress: 50%  Related Problem: Achieve a greater level of family connectedness. Description: Learn and implement parenting practices that have demonstrated effectiveness. Target Date: 2021-08-07 Frequency: Biweekly Modality: individual Progress: 40%  Related Problem: Appropriately grieve the loss in order to normalize mood and to return to previously adaptive level of functioning. Description: Identify and replace thoughts and beliefs that support depression. Target Date: 2021-08-07 Frequency: Biweekly Modality: individual Progress: 60%  Planned Intervention: Facilitate and reinforce the  client's shift from biased depressive self-talk and beliefs to reality-based cognitive messages that enhance self-confidence and increase adaptive actions (see "Positive Self-Talk" in the Adult Psychotherapy Homework Planner by Bryn Gulling).  Planned Intervention: Assign the client to self-monitor thoughts, feelings, and actions in daily journal (e.g., "Negative Thoughts Trigger Negative Feelings" in the Adult Psychotherapy Homework Planner by Bryn Gulling; "Daily Record of Dysfunctional Thoughts" in Cognitive Therapy of Depression by Lupita Shutter and Warren Lacy); process the journal material to challenge depressive thinking  patterns and replace them with reality-based thoughts.  Related Problem: Appropriately grieve the loss in order to normalize mood and to return to previously adaptive level of functioning. Description: Learn and implement behavioral strategies to overcome depression. Target Date: 2021-08-07 Frequency: Biweekly Modality: individual Progress: 50% Planned Intervention: Assist the client in developing skills that increase the likelihood of deriving pleasure from behavioral activation (e.g., assertiveness skills, developing an exercise plan, less internal/more external focus, increased social involvement); reinforce success.  Related Problem: Appropriately grieve the loss in order to normalize mood and to return to previously adaptive level of functioning. Description: Implement mindfulness techniques for relapse prevention. Target Date: 2021-08-07 Frequency: Biweekly Modality: individual Progress: 30%  Planned Intervention: Work to increase the client's new sense of well-being by building his/her personal strengths evident in their progress through therapy (or assign "Acknowledging My Strengths" and/or "What Are My Good Qualities?" in the Adult Psychotherapy Homework Planner by Bryn Gulling).  Related Problem: Appropriately grieve the loss in order to normalize mood and to return to previously  adaptive level of functioning. Description: Learn and implement problem-solving and decision-making skills. Target Date: 2021-08-07 Frequency: Biweekly Modality: individual Progress: 50%  Related Problem: Appropriately grieve the loss in order to normalize mood and to return to previously adaptive level of functioning. Description: Learn and implement conflict resolution skills to resolve interpersonal problems. Target Date: 2021-08-07 Frequency: Biweekly Modality: individual Progress: 50%  Related Problem: Appropriately grieve the loss in order to normalize mood and to return to previously adaptive level of functioning. Description: Increasingly verbalize hopeful and positive statements regarding self, others, and the future. Target Date: 2021-08-07 Frequency: Biweekly Modality: individual Progress: 50%  Related Problem: Decrease the level of present conflict with parents while beginning to let go of or resolving past conflicts with them. Description: Identify own as well as others' role in the family conflicts. Target Date: 2021-08-07 Frequency: Biweekly Modality: individual Progress: 40%  Related Problem: Decrease the level of present conflict with parents while beginning to let go of or resolving past conflicts with them. Description: Describe the conflicts and the causes of conflicts between self and parents. Target Date: 2021-08-07 Frequency: Biweekly Modality: individual Progress: 60%  Client Response full compliance  Service Location Location, [address] [place], [place] [address]  Service Code cpt [phone])  Assess/facilitate readiness to change  Rationally challenge thoughts or beliefs/cognitive restructuring  Facilitate problem solving  Identify automatic thoughts  Normalize/Reframe  Validate/empathize  Identify/label emotions  Mindfulness training Time Management/ Organizational Skills Comments   Naila Elizondo and I met today in remote video  (WebEx) individual face to face psychotherapy as an accommodation to the [place] pandemic.  Distance Site:  Clients Home Originating Site: Dr. 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  Her anxiety is better managed and her sad mood has dissipated.  However, she c/o that she is very fatigued.  Last week we d/ exercise, it benefits for depression and how to set herself up for success when her motivtaion is flagging.  Demetress's fatigue is keeping her from exercising.  I asked her to try walking for just 15 minutes right after work.    I inquired when she last had her Vitamin D levels check and it's been at least a year.  It seems she did not know to take her Vitamin D with a fatty food.  I offered some suggestions for how  to boost better absorption.  She agreed to speak to her provider about checking her Vitamin D level when she goes in to get her blood pressure checked.    Home Practice: plana social outing   Progress: Patients mood states have improved aeb decreased feelings of dread around work, level of dysphoria, irritability, negative thinking and increased motivation to pursue social interactions.  Pt demonstrates increased use of coping strategies and effective parenting skills.   ______________________________  Medications:   [place], [place], [place], [place]   Atenolol 50 mg PRN Wellbutrin 450 mg Lexapro 40 mg B12 Complex Omega 3 Pre-Natal Vitamin Vitamin D + K   Royetta Crochet, PhD

## 2021-04-23 ENCOUNTER — Ambulatory Visit (INDEPENDENT_AMBULATORY_CARE_PROVIDER_SITE_OTHER): Payer: Federal, State, Local not specified - PPO | Admitting: Psychology

## 2021-04-23 DIAGNOSIS — F330 Major depressive disorder, recurrent, mild: Secondary | ICD-10-CM

## 2021-04-23 NOTE — Progress Notes (Signed)
Progress Note  Start: 04/23/2021 1:00 PM End:  04/23/2021 1:55 PM Diagnosis unspecified Symptoms Constant or frequent conflict with parents and/or siblings. (Status: maintained) -- No Description Entered  Decrease or loss of appetite. (Status: maintained) -- No Description Entered  Depressed or irritable mood. (Status: maintained) -- No Description Entered  Diminished interest in or enjoyment of activities. (Status: maintained) -- No Description Entered  Displays deficits in parenting knowledge and skills. (Status: maintained) -- No Description Entered  History of chronic or recurrent depression for which the client has taken antidepressant medication, been hospitalized, had outpatient treatment, or had a course of electroconvulsive therapy. (Status: maintained) -- No Description Entered  Lack of energy. (Status: maintained) -- No Description Entered  Lacks knowledge regarding reasonable expectations for a child's behavior at a given developmental level. (Status: maintained) -- No Description Entered  Low self-esteem. (Status: maintained) -- No Description Entered  Psychomotor agitation or retardation. (Status: maintained) -- No Description Entered  Sleeplessness or hypersomnia. (Status: maintained) -- No Description Entered  Social withdrawal. (Status: maintained) -- No Description Entered  Medication Status instructed to contact psychiatrist  compliance  Safety none  If Suicidal or Homicidal State Action Taken: unspecified  Current Risk: low Medications unspecified Objectives Related Problem: Achieve a greater level of family connectedness. Description: Parents and child report an increased feeling of connectedness between them. Target Date: 2021-08-07 Frequency: Biweekly Modality: individual Progress: 70%  Related Problem: Achieve a greater level of family connectedness. Description: Identify unresolved childhood issues that affect parenting and work toward their  resolution. Target Date: 2021-08-07 Frequency: Biweekly Modality: individual Progress: 50% Planned Intervention: Assist the parents in working through issues from their own childhood that are unresolved.  Related Problem: Achieve a greater level of family connectedness. Description: Parents expand repertoire of parenting options. Target Date: 2021-08-07 Frequency: Biweekly Modality: individual Progress: 40% Planned Intervention: Support, empower, monitor, and encourage the parents in implementing new strategies for parenting their child, giving feedback and redirection as needed.  Planned Intervention: Expand the parents' repertoire of intervention options by having them read material on parenting difficult children (e.g., The Difficult Child by Linton Rump and Firefighter; The Explosive Child by Carlota Raspberry; How to Handle a Hard-to-Handle Kid by [place]).  Related Problem: Achieve a greater level of family connectedness. Description: Develop skills to talk openly and effectively with the children. Target Date: 2021-08-07 Frequency: Biweekly Modality: individual Progress: 50%  Related Problem: Achieve a greater level of family connectedness. Description: Verbalize a sense of increased skill, effectiveness, and confidence in parenting. Target Date: 2021-08-07 Frequency: Biweekly Modality: individual Progress: 50%  Related Problem: Achieve a greater level of family connectedness. Description: Learn and implement parenting practices that have demonstrated effectiveness. Target Date: 2021-08-07 Frequency: Biweekly Modality: individual Progress: 40%  Related Problem: Appropriately grieve the loss in order to normalize mood and to return to previously adaptive level of functioning. Description: Identify and replace thoughts and beliefs that support depression. Target Date: 2021-08-07 Frequency: Biweekly Modality: individual Progress: 60%  Planned Intervention: Facilitate and reinforce the  client's shift from biased depressive self-talk and beliefs to reality-based cognitive messages that enhance self-confidence and increase adaptive actions (see "Positive Self-Talk" in the Adult Psychotherapy Homework Planner by Bryn Gulling).  Planned Intervention: Assign the client to self-monitor thoughts, feelings, and actions in daily journal (e.g., "Negative Thoughts Trigger Negative Feelings" in the Adult Psychotherapy Homework Planner by Bryn Gulling; "Daily Record of Dysfunctional Thoughts" in Cognitive Therapy of Depression by Lupita Shutter and Warren Lacy); process the journal material to challenge depressive thinking  patterns and replace them with reality-based thoughts.  Related Problem: Appropriately grieve the loss in order to normalize mood and to return to previously adaptive level of functioning. Description: Learn and implement behavioral strategies to overcome depression. Target Date: 2021-08-07 Frequency: Biweekly Modality: individual Progress: 50% Planned Intervention: Assist the client in developing skills that increase the likelihood of deriving pleasure from behavioral activation (e.g., assertiveness skills, developing an exercise plan, less internal/more external focus, increased social involvement); reinforce success.  Related Problem: Appropriately grieve the loss in order to normalize mood and to return to previously adaptive level of functioning. Description: Implement mindfulness techniques for relapse prevention. Target Date: 2021-08-07 Frequency: Biweekly Modality: individual Progress: 30%  Planned Intervention: Work to increase the client's new sense of well-being by building his/her personal strengths evident in their progress through therapy (or assign "Acknowledging My Strengths" and/or "What Are My Good Qualities?" in the Adult Psychotherapy Homework Planner by Bryn Gulling).  Related Problem: Appropriately grieve the loss in order to normalize mood and to return to previously  adaptive level of functioning. Description: Learn and implement problem-solving and decision-making skills. Target Date: 2021-08-07 Frequency: Biweekly Modality: individual Progress: 50%  Related Problem: Appropriately grieve the loss in order to normalize mood and to return to previously adaptive level of functioning. Description: Learn and implement conflict resolution skills to resolve interpersonal problems. Target Date: 2021-08-07 Frequency: Biweekly Modality: individual Progress: 50%  Related Problem: Appropriately grieve the loss in order to normalize mood and to return to previously adaptive level of functioning. Description: Increasingly verbalize hopeful and positive statements regarding self, others, and the future. Target Date: 2021-08-07 Frequency: Biweekly Modality: individual Progress: 50%  Related Problem: Decrease the level of present conflict with parents while beginning to let go of or resolving past conflicts with them. Description: Identify own as well as others' role in the family conflicts. Target Date: 2021-08-07 Frequency: Biweekly Modality: individual Progress: 40%  Related Problem: Decrease the level of present conflict with parents while beginning to let go of or resolving past conflicts with them. Description: Describe the conflicts and the causes of conflicts between self and parents. Target Date: 2021-08-07 Frequency: Biweekly Modality: individual Progress: 60%  Client Response full compliance  Service Location Location, [address] [place], [place] [address]  Service Code cpt [phone])  Assess/facilitate readiness to change  Rationally challenge thoughts or beliefs/cognitive restructuring  Facilitate problem solving  Identify automatic thoughts  Normalize/Reframe  Validate/empathize  Identify/label emotions  Mindfulness training Distress Tolerance Skills Comments   Krista [name] and I met today in remote video (WebEx)  individual face to face psychotherapy as an accommodation to the [place] pandemic.  Distance Site:  Clients Home Originating Site: Dr. 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  She shared that she was more productive and felt good about herself.  She then shared something that occurred with the boy's father that got her very upset and she didn't understand why.  Channelle also admitted that she also hasn't been able to calm down and was still in a bad mood as a result of his behavior.  We d/e/p her thoughts, feelings and actions.  I reviewed DBT skills with her to remind her that she options other than focusing on what happened and making/keeping herself miserable.  I provided psycho education about how to use radical  acceptance and distract skills to manage her distress.     Home Practice: plan a social outing   Progress: Patients mood states have improved aeb decreased feelings of dread around work, level of dysphoria, irritability, negative thinking and increased motivation to pursue social interactions.  Pt demonstrates increased use of coping strategies and effective parenting skills.   ______________________________  Medications:   [place], [place], [place], [place]   Atenolol 50 mg PRN Wellbutrin 450 mg Lexapro 40 mg B12 Complex Omega 3 Pre-Natal Vitamin Vitamin D + K   Royetta Crochet, PhD

## 2021-04-23 NOTE — Addendum Note (Signed)
Addended by: Treasa [employer] ANN on: 04/23/2021 01:55 PM   Modules accepted: Level of Service

## 2021-05-07 ENCOUNTER — Ambulatory Visit (INDEPENDENT_AMBULATORY_CARE_PROVIDER_SITE_OTHER): Payer: Federal, State, Local not specified - PPO | Admitting: Psychology

## 2021-05-07 DIAGNOSIS — F330 Major depressive disorder, recurrent, mild: Secondary | ICD-10-CM | POA: Diagnosis not present

## 2021-05-07 NOTE — Progress Notes (Signed)
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 (Status: maintained) -- No Description Entered  Decrease or loss of appetite. (Status: maintained) -- No Description Entered  Depressed or irritable mood. (Status: maintained) -- No Description Entered  Diminished interest in or enjoyment of activities. (Status: maintained) -- No Description Entered  Displays deficits in parenting knowledge and skills. (Status: maintained) -- No Description Entered  History of chronic or recurrent depression for which the client has taken antidepressant medication, been hospitalized, had outpatient treatment, or had a course of electroconvulsive therapy. (Status: maintained) -- No Description Entered  Lack of energy. (Status: maintained) -- No Description Entered  Lacks knowledge regarding reasonable expectations for a child's behavior at a given developmental level. (Status: maintained) -- No Description Entered  Low self-esteem. (Status: maintained) -- No Description Entered  Psychomotor agitation or retardation. (Status: maintained) -- No Description Entered  Sleeplessness or hypersomnia. (Status: maintained) -- No Description Entered  Social withdrawal. (Status: maintained) -- No Description Entered  Medication Status instructed to contact psychiatrist  compliance  Safety none  If Suicidal or Homicidal State Action Taken: unspecified  Current Risk: low Medications unspecified Objectives Related Problem: Achieve a greater level of family connectedness. Description: Parents and child report an increased feeling of connectedness between them. Target Date: 2021-08-07 Frequency: Biweekly Modality: individual Progress: 70%  Related Problem: Achieve a greater level of family  connectedness. Description: Identify unresolved childhood issues that affect parenting and work toward their resolution. Target Date: 2021-08-07 Frequency: Biweekly Modality: individual Progress: 50% Planned Intervention: Assist the parents in working through issues from their own childhood that are unresolved.  Related Problem: Achieve a greater level of family connectedness. Description: Parents expand repertoire of parenting options. Target Date: 2021-08-07 Frequency: Biweekly Modality: individual Progress: 40% Planned Intervention: Support, empower, monitor, and encourage the parents in implementing new strategies for parenting their child, giving feedback and redirection as needed.  Planned Intervention: Expand the parents' repertoire of intervention options by having them read material on parenting difficult children (e.g., The Difficult Child by Linton Rump and Firefighter; The Explosive Child by Carlota Raspberry; How to Handle a Hard-to-Handle Kid by [place]).  Related Problem: Achieve a greater level of family connectedness. Description: Develop skills to talk openly and effectively with the children. Target Date: 2021-08-07 Frequency: Biweekly Modality: individual Progress: 50%  Related Problem: Achieve a greater level of family connectedness. Description: Verbalize a sense of increased skill, effectiveness, and confidence in parenting. Target Date: 2021-08-07 Frequency: Biweekly Modality: individual Progress: 50%  Related Problem: Achieve a greater level of family connectedness. Description: Learn and implement parenting practices that have demonstrated effectiveness. Target Date: 2021-08-07 Frequency: Biweekly Modality: individual Progress: 40%  Related Problem: Appropriately grieve the loss in order to normalize mood and to return to previously adaptive level of functioning. Description: Identify and replace thoughts and beliefs that support depression. Target Date:  2021-08-07 Frequency: Biweekly Modality: individual Progress: 60%  Planned Intervention: Facilitate and reinforce the client's shift from biased depressive self-talk and beliefs to reality-based cognitive messages that enhance self-confidence and increase adaptive actions (see "Positive Self-Talk" in the Adult Psychotherapy Homework Planner by Bryn Gulling).  Planned Intervention: Assign the client to self-monitor thoughts, feelings, and actions in daily journal (e.g., "Negative Thoughts Trigger Negative Feelings" in the Adult Psychotherapy Homework Planner by Bryn Gulling; "Daily Record of Dysfunctional  Thoughts" in Cognitive Therapy of Depression by Lupita Shutter and Warren Lacy); process the journal material to challenge depressive thinking patterns and replace them with reality-based thoughts.  Related Problem: Appropriately grieve the loss in order to normalize mood and to return to previously adaptive level of functioning. Description: Learn and implement behavioral strategies to overcome depression. Target Date: 2021-08-07 Frequency: Biweekly Modality: individual Progress: 50% Planned Intervention: Assist the client in developing skills that increase the likelihood of deriving pleasure from behavioral activation (e.g., assertiveness skills, developing an exercise plan, less internal/more external focus, increased social involvement); reinforce success.  Related Problem: Appropriately grieve the loss in order to normalize mood and to return to previously adaptive level of functioning. Description: Implement mindfulness techniques for relapse prevention. Target Date: 2021-08-07 Frequency: Biweekly Modality: individual Progress: 30%  Planned Intervention: Work to increase the client's new sense of well-being by building his/her personal strengths evident in their progress through therapy (or assign "Acknowledging My Strengths" and/or "What Are My Good Qualities?" in the Adult Psychotherapy Homework  Planner by Bryn Gulling).  Related Problem: Appropriately grieve the loss in order to normalize mood and to return to previously adaptive level of functioning. Description: Learn and implement problem-solving and decision-making skills. Target Date: 2021-08-07 Frequency: Biweekly Modality: individual Progress: 50%  Related Problem: Appropriately grieve the loss in order to normalize mood and to return to previously adaptive level of functioning. Description: Learn and implement conflict resolution skills to resolve interpersonal problems. Target Date: 2021-08-07 Frequency: Biweekly Modality: individual Progress: 50%  Related Problem: Appropriately grieve the loss in order to normalize mood and to return to previously adaptive level of functioning. Description: Increasingly verbalize hopeful and positive statements regarding self, others, and the future. Target Date: 2021-08-07 Frequency: Biweekly Modality: individual Progress: 50%  Related Problem: Decrease the level of present conflict with parents while beginning to let go of or resolving past conflicts with them. Description: Identify own as well as others' role in the family conflicts. Target Date: 2021-08-07 Frequency: Biweekly Modality: individual Progress: 40%  Related Problem: Decrease the level of present conflict with parents while beginning to let go of or resolving past conflicts with them. Description: Describe the conflicts and the causes of conflicts between self and parents. Target Date: 2021-08-07 Frequency: Biweekly Modality: individual Progress: 60%  Client Response full compliance  Service Location Location, [address] [place], [place] [address]  Service Code cpt [phone])  Assess/facilitate readiness to change  Rationally challenge thoughts or beliefs/cognitive restructuring  Facilitate problem solving  Identify automatic thoughts  Normalize/Reframe  Validate/empathize  Identify/label emotions   Mindfulness training Distress Tolerance Skills Comments   Antonae Zbikowski and I met today in remote video (WebEx) individual face to face psychotherapy as an accommodation to the [place] pandemic.  Distance Site:  Clients Home Originating Site: Dr. 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  I reminded her that she is doing better with the Wellbutrin.  We d/e the ways in which she is feeling better (ie, feeling up to socializing, easier to wake up in the morning).  I provided some psycho education about creating an "upward spiral" when she is in a good place.  I encouraged Makensie to work on establishing some healthy lifestyle routines (ie, exercise, better diet).  We d/ including her sons in some of these routines to  be both a positive role model but also as accountability partners.   Home Practice: plan a social outing   Progress: Patients mood states have improved aeb decreased feelings of dread around work, level of dysphoria, irritability, negative thinking and increased motivation to pursue social interactions.  Pt demonstrates increased use of coping strategies and effective parenting skills.  Pt continues to experience premenstrual dysphoria.  ______________________________  Medications:   [place]   Atenolol 50 mg PRN Wellbutrin 450 mg Lexapro 40 mg B12 Complex Omega 3 Pre-Natal Vitamin Vitamin D + K   Royetta Crochet, PhD

## 2021-05-08 DIAGNOSIS — F411 Generalized anxiety disorder: Secondary | ICD-10-CM | POA: Diagnosis not present

## 2021-05-08 DIAGNOSIS — F900 Attention-deficit hyperactivity disorder, predominantly inattentive type: Secondary | ICD-10-CM | POA: Diagnosis not present

## 2021-05-08 DIAGNOSIS — F1010 Alcohol abuse, uncomplicated: Secondary | ICD-10-CM | POA: Diagnosis not present

## 2021-05-08 DIAGNOSIS — F339 Major depressive disorder, recurrent, unspecified: Secondary | ICD-10-CM | POA: Diagnosis not present

## 2021-05-21 ENCOUNTER — Ambulatory Visit (INDEPENDENT_AMBULATORY_CARE_PROVIDER_SITE_OTHER): Payer: Federal, State, Local not specified - PPO | Admitting: Psychology

## 2021-05-21 DIAGNOSIS — F3342 Major depressive disorder, recurrent, in full remission: Secondary | ICD-10-CM | POA: Diagnosis not present

## 2021-05-21 NOTE — Progress Notes (Signed)
? ?PROGRESS NOTE: ? ?Patient Name: Krista [name] ?Date of Birth: Nov 02, 1985   ?MRN: [id number]  ?Date of Service: 05/21/2021 ? ?Start: 1:00 PM ?End:   1:55 PM ? ?Diagnosis ?F 33.42 Major Depressive Disorder, recurrent, full remission ?Symptoms ?Constant or frequent conflict with parents and/or siblings. (Status: maintained) -- No Description Entered  ?Decrease or loss of appetite. (Status: maintained) -- No Description Entered  ?Depressed or irritable mood. (Status: maintained) -- No Description Entered  ?Diminished interest in or enjoyment of activities. (Status: maintained) -- No Description Entered  ?Displays deficits in parenting knowledge and skills. (Status: maintained) -- No Description Entered  ?History of chronic or recurrent depression for which the client has taken antidepressant medication, been hospitalized, had outpatient treatment, or had a course of electroconvulsive therapy. (Status: maintained) -- No Description Entered  ?Lack of energy. (Status: maintained) -- No Description Entered  ?Lacks knowledge regarding reasonable expectations for a child's behavior at a given developmental level. (Status: maintained) -- No Description Entered  ?Low self-esteem. (Status: maintained) -- No Description Entered  ?Psychomotor agitation or retardation. (Status: maintained) -- No Description Entered  ?Sleeplessness or hypersomnia. (Status: maintained) -- No Description Entered  ?Social withdrawal. (Status: maintained) -- No Description Entered  ?Medication Status ?instructed to contact psychiatrist  ?compliance  ?Safety ?none  ?If Suicidal or Homicidal State Action Taken: unspecified  ?Current Risk: low ?Medications ?unspecified ?Objectives ?Related Problem: Achieve a greater level of family connectedness. ?Description: Parents and child report an increased feeling of connectedness between them. ?Target Date: 2021-08-07 ?Frequency: Biweekly ?Modality: individual ?Progress: 70% ? ?Related Problem: Achieve a  greater level of family connectedness. ?Description: Identify unresolved childhood issues that affect parenting and work toward their resolution. ?Target Date: 2021-08-07 ?Frequency: Biweekly ?Modality: individual ?Progress: 50% ?Planned Intervention: Assist the parents in working through issues from their own childhood that are unresolved.  ?Related Problem: Achieve a greater level of family connectedness. ?Description: Parents expand repertoire of parenting options. ?Target Date: 2021-08-07 ?Frequency: Biweekly ?Modality: individual ?Progress: 40% ?Planned Intervention: Support, empower, monitor, and encourage the parents in implementing new strategies for parenting their child, giving feedback and redirection as needed.  ?Planned Intervention: Expand the parents' repertoire of intervention options by having them read material on parenting difficult children (e.g., The Difficult Child by Linton Rump and Firefighter; The Explosive Child by Carlota Raspberry; How to Handle a Hard-to-Handle Kid by [place]).  ?Related Problem: Achieve a greater level of family connectedness. ?Description: Develop skills to talk openly and effectively with the children. ?Target Date: 2021-08-07 ?Frequency: Biweekly ?Modality: individual ?Progress: 50% ? ?Related Problem: Achieve a greater level of family connectedness. ?Description: Verbalize a sense of increased skill, effectiveness, and confidence in parenting. ?Target Date: 2021-08-07 ?Frequency: Biweekly ?Modality: individual ?Progress: 50% ? ?Related Problem: Achieve a greater level of family connectedness. ?Description: Learn and implement parenting practices that have demonstrated effectiveness. ?Target Date: 2021-08-07 ?Frequency: Biweekly ?Modality: individual ?Progress: 40% ? ?Related Problem: Appropriately grieve the loss in order to normalize mood and to return to previously adaptive level of functioning. ?Description: Identify and replace thoughts and beliefs that support depression. ?Target  Date: 2021-08-07 ?Frequency: Biweekly ?Modality: individual ?Progress: 60% ? ?Planned Intervention: Facilitate and reinforce the client's shift from biased depressive self-talk and beliefs to reality-based cognitive messages that enhance self-confidence and increase adaptive actions (see "Positive Self-Talk" in the Adult Psychotherapy Homework Planner by Bryn Gulling).  ?Planned Intervention: Assign the client to self-monitor thoughts, feelings, and actions in daily journal (e.g., "Negative Thoughts Trigger Negative Feelings" in the Adult Psychotherapy Homework  Planner by Bryn Gulling; "Daily Record of Dysfunctional Thoughts" in Cognitive Therapy of Depression by Lupita Shutter, and Warren Lacy); process the journal material to challenge depressive thinking patterns and replace them with reality-based thoughts.  ?Related Problem: Appropriately grieve the loss in order to normalize mood and to return to previously adaptive level of functioning. ?Description: Learn and implement behavioral strategies to overcome depression. ?Target Date: 2021-08-07 ?Frequency: Biweekly ?Modality: individual ?Progress: 50% ?Planned Intervention: Assist the client in developing skills that increase the likelihood of deriving pleasure from behavioral activation (e.g., assertiveness skills, developing an exercise plan, less internal/more external focus, increased social involvement); reinforce success.  ?Related Problem: Appropriately grieve the loss in order to normalize mood and to return to previously adaptive level of functioning. ?Description: Implement mindfulness techniques for relapse prevention. ?Target Date: 2021-08-07 ?Frequency: Biweekly ?Modality: individual ?Progress: 30% ? ?Planned Intervention: Work to increase the client's new sense of well-being by building his/her personal strengths evident in their progress through therapy (or assign "Acknowledging My Strengths" and/or "What Are My Good Qualities?" in the Adult Psychotherapy  Homework Planner by Bryn Gulling).  ?Related Problem: Appropriately grieve the loss in order to normalize mood and to return to previously adaptive level of functioning. ?Description: Learn and implement problem-solving and decision-making skills. ?Target Date: 2021-08-07 ?Frequency: Biweekly ?Modality: individual ?Progress: 50% ? ?Related Problem: Appropriately grieve the loss in order to normalize mood and to return to previously adaptive level of functioning. ?Description: Learn and implement conflict resolution skills to resolve interpersonal problems. ?Target Date: 2021-08-07 ?Frequency: Biweekly ?Modality: individual ?Progress: 50% ? ?Related Problem: Appropriately grieve the loss in order to normalize mood and to return to previously adaptive level of functioning. ?Description: Increasingly verbalize hopeful and positive statements regarding self, others, and the future. ?Target Date: 2021-08-07 ?Frequency: Biweekly ?Modality: individual ?Progress: 50% ? ?Related Problem: Decrease the level of present conflict with parents while beginning to let go of or resolving past conflicts with them. ?Description: Identify own as well as others' role in the family conflicts. ?Target Date: 2021-08-07 ?Frequency: Biweekly ?Modality: individual ?Progress: 40% ? ?Related Problem: Decrease the level of present conflict with parents while beginning to let go of or resolving past conflicts with them. ?Description: Describe the conflicts and the causes of conflicts between self and parents. ?Target Date: 2021-08-07 ?Frequency: Biweekly ?Modality: individual ?Progress: 60% ? ?Client Response ?full compliance  ?Service Location ?Location, [address] [place], [place] [address]  ?Service Code ?cpt 37096K (95)  ?Assess/facilitate readiness to change  ?Rationally challenge thoughts or beliefs/cognitive restructuring  ?Facilitate problem solving  ?Identify automatic thoughts  ?Normalize/Reframe  ?Validate/empathize  ?Identify/label  emotions  ?Mindfulness training ?Distress Tolerance Skills ?Comments  ? ?Vinnie Level and I met today in remote video (WebEx) individual face to face psychotherapy as an accommodation to the [place] pandemic. ? ?Dist

## 2021-06-04 ENCOUNTER — Ambulatory Visit (INDEPENDENT_AMBULATORY_CARE_PROVIDER_SITE_OTHER): Payer: Federal, State, Local not specified - PPO | Admitting: Psychology

## 2021-06-04 DIAGNOSIS — F3342 Major depressive disorder, recurrent, in full remission: Secondary | ICD-10-CM

## 2021-06-04 NOTE — Progress Notes (Signed)
?PROGRESS NOTE: ? ?Patient Name: Krista [name] ?Date of Birth: [birth year]   ?MRN: [id number]  ?PCP: Carlena Hurl, PA-C  ? ?Date of Service: 06/04/2021 ?Start: 1:00 PM ?End:   1:55 PM ? ?Diagnosis ?F 33.42 Major Depressive Disorder, recurrent, full remission ?Symptoms ?Constant or frequent conflict with parents and/or siblings. (Status: maintained) -- No Description Entered  ?Decrease or loss of appetite. (Status: maintained) -- No Description Entered  ?Depressed or irritable mood. (Status: maintained) -- No Description Entered  ?Diminished interest in or enjoyment of activities. (Status: maintained) -- No Description Entered  ?Displays deficits in parenting knowledge and skills. (Status: maintained) -- No Description Entered  ?History of chronic or recurrent depression for which the client has taken antidepressant medication, been hospitalized, had outpatient treatment, or had a course of electroconvulsive therapy. (Status: maintained) -- No Description Entered  ?Lack of energy. (Status: maintained) -- No Description Entered  ?Lacks knowledge regarding reasonable expectations for a child's behavior at a given developmental level. (Status: maintained) -- No Description Entered  ?Low self-esteem. (Status: maintained) -- No Description Entered  ?Psychomotor agitation or retardation. (Status: maintained) -- No Description Entered  ?Sleeplessness or hypersomnia. (Status: maintained) -- No Description Entered  ?Social withdrawal. (Status: maintained) -- No Description Entered  ?Medication Status ?instructed to contact psychiatrist  ?compliance  ?Safety ?none  ?If Suicidal or Homicidal State Action Taken: unspecified  ?Current Risk: low ?Medications ?unspecified ?Objectives ?Related Problem: Achieve a greater level of family connectedness. ?Description: Parents and child report an increased feeling of connectedness between them. ?Target Date: 2021-08-07 ?Frequency: Biweekly ?Modality: individual ?Progress:  70% ? ?Related Problem: Achieve a greater level of family connectedness. ?Description: Identify unresolved childhood issues that affect parenting and work toward their resolution. ?Target Date: 2021-08-07 ?Frequency: Biweekly ?Modality: individual ?Progress: 50% ?Planned Intervention: Assist the parents in working through issues from their own childhood that are unresolved.  ?Related Problem: Achieve a greater level of family connectedness. ?Description: Parents expand repertoire of parenting options. ?Target Date: 2021-08-07 ?Frequency: Biweekly ?Modality: individual ?Progress: 40% ?Planned Intervention: Support, empower, monitor, and encourage the parents in implementing new strategies for parenting their child, giving feedback and redirection as needed.  ?Planned Intervention: Expand the parents' repertoire of intervention options by having them read material on parenting difficult children (e.g., The Difficult Child by Linton Rump and Firefighter; The Explosive Child by Carlota Raspberry; How to Handle a Hard-to-Handle Kid by McLaughlin).  ?Related Problem: Achieve a greater level of family connectedness. ?Description: Develop skills to talk openly and effectively with the children. ?Target Date: 2021-08-07 ?Frequency: Biweekly ?Modality: individual ?Progress: 50% ? ?Related Problem: Achieve a greater level of family connectedness. ?Description: Verbalize a sense of increased skill, effectiveness, and confidence in parenting. ?Target Date: 2021-08-07 ?Frequency: Biweekly ?Modality: individual ?Progress: 50% ? ?Related Problem: Achieve a greater level of family connectedness. ?Description: Learn and implement parenting practices that have demonstrated effectiveness. ?Target Date: 2021-08-07 ?Frequency: Biweekly ?Modality: individual ?Progress: 40% ? ?Related Problem: Appropriately grieve the loss in order to normalize mood and to return to previously adaptive level of functioning. ?Description: Identify and replace thoughts and beliefs  that support depression. ?Target Date: 2021-08-07 ?Frequency: Biweekly ?Modality: individual ?Progress: 60% ? ?Planned Intervention: Facilitate and reinforce the client's shift from biased depressive self-talk and beliefs to reality-based cognitive messages that enhance self-confidence and increase adaptive actions (see "Positive Self-Talk" in the Adult Psychotherapy Homework Planner by Bryn Gulling).  ?Planned Intervention: Assign the client to self-monitor thoughts, feelings, and actions in daily journal (e.g., "Negative Thoughts Trigger Negative Feelings"  in the Adult Psychotherapy Homework Planner by Bryn Gulling; "Daily Record of Dysfunctional Thoughts" in Cognitive Therapy of Depression by Lupita Shutter and Warren Lacy); process the journal material to challenge depressive thinking patterns and replace them with reality-based thoughts.  ?Related Problem: Appropriately grieve the loss in order to normalize mood and to return to previously adaptive level of functioning. ?Description: Learn and implement behavioral strategies to overcome depression. ?Target Date: 2021-08-07 ?Frequency: Biweekly ?Modality: individual ?Progress: 50% ?Planned Intervention: Assist the client in developing skills that increase the likelihood of deriving pleasure from behavioral activation (e.g., assertiveness skills, developing an exercise plan, less internal/more external focus, increased social involvement); reinforce success.  ?Related Problem: Appropriately grieve the loss in order to normalize mood and to return to previously adaptive level of functioning. ?Description: Implement mindfulness techniques for relapse prevention. ?Target Date: 2021-08-07 ?Frequency: Biweekly ?Modality: individual ?Progress: 30% ? ?Planned Intervention: Work to increase the client's new sense of well-being by building his/her personal strengths evident in their progress through therapy (or assign "Acknowledging My Strengths" and/or "What Are My Good Qualities?"  in the Adult Psychotherapy Homework Planner by Bryn Gulling).  ?Related Problem: Appropriately grieve the loss in order to normalize mood and to return to previously adaptive level of functioning. ?Description: Learn and implement problem-solving and decision-making skills. ?Target Date: 2021-08-07 ?Frequency: Biweekly ?Modality: individual ?Progress: 50% ? ?Related Problem: Appropriately grieve the loss in order to normalize mood and to return to previously adaptive level of functioning. ?Description: Learn and implement conflict resolution skills to resolve interpersonal problems. ?Target Date: 2021-08-07 ?Frequency: Biweekly ?Modality: individual ?Progress: 50% ? ?Related Problem: Appropriately grieve the loss in order to normalize mood and to return to previously adaptive level of functioning. ?Description: Increasingly verbalize hopeful and positive statements regarding self, others, and the future. ?Target Date: 2021-08-07 ?Frequency: Biweekly ?Modality: individual ?Progress: 50% ? ?Related Problem: Decrease the level of present conflict with parents while beginning to let go of or resolving past conflicts with them. ?Description: Identify own as well as others' role in the family conflicts. ?Target Date: 2021-08-07 ?Frequency: Biweekly ?Modality: individual ?Progress: 40% ? ?Related Problem: Decrease the level of present conflict with parents while beginning to let go of or resolving past conflicts with them. ?Description: Describe the conflicts and the causes of conflicts between self and parents. ?Target Date: 2021-08-07 ?Frequency: Biweekly ?Modality: individual ?Progress: 60% ? ?Client Response ?full compliance  ?Service Location ?Location, [address] [place], [place] [address]  ?Service Code ?cpt 29562Z (95)  ?Assess/facilitate readiness to change  ?Rationally challenge thoughts or beliefs/cognitive restructuring  ?Facilitate problem solving  ?Identify automatic thoughts  ?Normalize/Reframe   ?Validate/empathize  ?Identify/label emotions  ?Mindfulness training ?Distress Tolerance Skills ?Comments  ? ?Vinnie Level and I met today in remote video (WebEx) individual face to face psychotherapy as an accommodation t

## 2021-06-18 ENCOUNTER — Ambulatory Visit (INDEPENDENT_AMBULATORY_CARE_PROVIDER_SITE_OTHER): Payer: Federal, State, Local not specified - PPO | Admitting: Psychology

## 2021-06-18 DIAGNOSIS — F3342 Major depressive disorder, recurrent, in full remission: Secondary | ICD-10-CM

## 2021-06-18 NOTE — Progress Notes (Signed)
?PROGRESS NOTE: ? ?Patient Name: Krista [name] ?Date of Birth: June 18, 1985   ?MRN: [id number]  ?PCP: Carlena Hurl, PA-C  ? ?Date of Service: 06/18/2021 ?Start: 1:00 PM ?End:   1:55 PM ? ?Diagnosis ?F 33.42 Major Depressive Disorder, recurrent, full remission ?Symptoms ?Constant or frequent conflict with parents and/or siblings. (Status: maintained) -- No Description Entered  ?Decrease or loss of appetite. (Status: maintained) -- No Description Entered  ?Depressed or irritable mood. (Status: maintained) -- No Description Entered  ?Diminished interest in or enjoyment of activities. (Status: maintained) -- No Description Entered  ?Displays deficits in parenting knowledge and skills. (Status: maintained) -- No Description Entered  ?History of chronic or recurrent depression for which the client has taken antidepressant medication, been hospitalized, had outpatient treatment, or had a course of electroconvulsive therapy. (Status: maintained) -- No Description Entered  ?Lack of energy. (Status: maintained) -- No Description Entered  ?Lacks knowledge regarding reasonable expectations for a child's behavior at a given developmental level. (Status: maintained) -- No Description Entered  ?Low self-esteem. (Status: maintained) -- No Description Entered  ?Psychomotor agitation or retardation. (Status: maintained) -- No Description Entered  ?Sleeplessness or hypersomnia. (Status: maintained) -- No Description Entered  ?Social withdrawal. (Status: maintained) -- No Description Entered  ?Medication Status ?instructed to contact psychiatrist  ?compliance  ?Safety ?none  ?If Suicidal or Homicidal State Action Taken: unspecified  ?Current Risk: low ?Medications ?unspecified ?Objectives ?Related Problem: Achieve a greater level of family connectedness. ?Description: Parents and child report an increased feeling of connectedness between them. ?Target Date: 2021-08-07 ?Frequency: Biweekly ?Modality: individual ?Progress:  70% ? ?Related Problem: Achieve a greater level of family connectedness. ?Description: Identify unresolved childhood issues that affect parenting and work toward their resolution. ?Target Date: 2021-08-07 ?Frequency: Biweekly ?Modality: individual ?Progress: 50% ?Planned Intervention: Assist the parents in working through issues from their own childhood that are unresolved.  ?Related Problem: Achieve a greater level of family connectedness. ?Description: Parents expand repertoire of parenting options. ?Target Date: 2021-08-07 ?Frequency: Biweekly ?Modality: individual ?Progress: 40% ?Planned Intervention: Support, empower, monitor, and encourage the parents in implementing new strategies for parenting their child, giving feedback and redirection as needed.  ?Planned Intervention: Expand the parents' repertoire of intervention options by having them read material on parenting difficult children (e.g., The Difficult Child by Linton Rump and Firefighter; The Explosive Child by Carlota Raspberry; How to Handle a Hard-to-Handle Kid by [place]).  ?Related Problem: Achieve a greater level of family connectedness. ?Description: Develop skills to talk openly and effectively with the children. ?Target Date: 2021-08-07 ?Frequency: Biweekly ?Modality: individual ?Progress: 50% ? ?Related Problem: Achieve a greater level of family connectedness. ?Description: Verbalize a sense of increased skill, effectiveness, and confidence in parenting. ?Target Date: 2021-08-07 ?Frequency: Biweekly ?Modality: individual ?Progress: 50% ? ?Related Problem: Achieve a greater level of family connectedness. ?Description: Learn and implement parenting practices that have demonstrated effectiveness. ?Target Date: 2021-08-07 ?Frequency: Biweekly ?Modality: individual ?Progress: 40% ? ?Related Problem: Appropriately grieve the loss in order to normalize mood and to return to previously adaptive level of functioning. ?Description: Identify and replace thoughts and beliefs  that support depression. ?Target Date: 2021-08-07 ?Frequency: Biweekly ?Modality: individual ?Progress: 60% ? ?Planned Intervention: Facilitate and reinforce the client's shift from biased depressive self-talk and beliefs to reality-based cognitive messages that enhance self-confidence and increase adaptive actions (see "Positive Self-Talk" in the Adult Psychotherapy Homework Planner by Bryn Gulling).  ?Planned Intervention: Assign the client to self-monitor thoughts, feelings, and actions in daily journal (e.g., "Negative Thoughts Trigger Negative Feelings"  in the Adult Psychotherapy Homework Planner by Bryn Gulling; "Daily Record of Dysfunctional Thoughts" in Cognitive Therapy of Depression by Lupita Shutter and Warren Lacy); process the journal material to challenge depressive thinking patterns and replace them with reality-based thoughts.  ?Related Problem: Appropriately grieve the loss in order to normalize mood and to return to previously adaptive level of functioning. ?Description: Learn and implement behavioral strategies to overcome depression. ?Target Date: 2021-08-07 ?Frequency: Biweekly ?Modality: individual ?Progress: 50% ?Planned Intervention: Assist the client in developing skills that increase the likelihood of deriving pleasure from behavioral activation (e.g., assertiveness skills, developing an exercise plan, less internal/more external focus, increased social involvement); reinforce success.  ?Related Problem: Appropriately grieve the loss in order to normalize mood and to return to previously adaptive level of functioning. ?Description: Implement mindfulness techniques for relapse prevention. ?Target Date: 2021-08-07 ?Frequency: Biweekly ?Modality: individual ?Progress: 30% ? ?Planned Intervention: Work to increase the client's new sense of well-being by building his/her personal strengths evident in their progress through therapy (or assign "Acknowledging My Strengths" and/or "What Are My Good Qualities?"  in the Adult Psychotherapy Homework Planner by Bryn Gulling).  ?Related Problem: Appropriately grieve the loss in order to normalize mood and to return to previously adaptive level of functioning. ?Description: Learn and implement problem-solving and decision-making skills. ?Target Date: 2021-08-07 ?Frequency: Biweekly ?Modality: individual ?Progress: 50% ? ?Related Problem: Appropriately grieve the loss in order to normalize mood and to return to previously adaptive level of functioning. ?Description: Learn and implement conflict resolution skills to resolve interpersonal problems. ?Target Date: 2021-08-07 ?Frequency: Biweekly ?Modality: individual ?Progress: 50% ? ?Related Problem: Appropriately grieve the loss in order to normalize mood and to return to previously adaptive level of functioning. ?Description: Increasingly verbalize hopeful and positive statements regarding self, others, and the future. ?Target Date: 2021-08-07 ?Frequency: Biweekly ?Modality: individual ?Progress: 50% ? ?Related Problem: Decrease the level of present conflict with parents while beginning to let go of or resolving past conflicts with them. ?Description: Identify own as well as others' role in the family conflicts. ?Target Date: 2021-08-07 ?Frequency: Biweekly ?Modality: individual ?Progress: 40% ? ?Related Problem: Decrease the level of present conflict with parents while beginning to let go of or resolving past conflicts with them. ?Description: Describe the conflicts and the causes of conflicts between self and parents. ?Target Date: 2021-08-07 ?Frequency: Biweekly ?Modality: individual ?Progress: 60% ? ?Client Response ?full compliance  ?Service Location ?Location, [address] [place], [place] [address]  ?Service Code ?cpt 29562Z (95)  ?Assess/facilitate readiness to change  ?Rationally challenge thoughts or beliefs/cognitive restructuring  ?Facilitate problem solving  ?Identify automatic thoughts  ?Normalize/Reframe   ?Validate/empathize  ?Identify/label emotions  ?Mindfulness training ?Distress Tolerance Skills ?Comments  ? ?Vinnie Level and I met today in remote video (WebEx) individual face to face psychotherapy as an accommodation t

## 2021-07-02 ENCOUNTER — Ambulatory Visit (INDEPENDENT_AMBULATORY_CARE_PROVIDER_SITE_OTHER): Payer: Federal, State, Local not specified - PPO | Admitting: Psychology

## 2021-07-02 DIAGNOSIS — F3342 Major depressive disorder, recurrent, in full remission: Secondary | ICD-10-CM | POA: Diagnosis not present

## 2021-07-02 NOTE — Progress Notes (Signed)
?PROGRESS NOTE: ? ?Patient Name: Krista [name] ?Date of Birth: 1985/05/14   ?MRN: [id number]  ?PCP: Carlena Hurl, PA-C  ? ?Date of Service: 07/02/2021 ?Start: 1:00 PM ?End:   1:55 PM ? ?Diagnosis ?F 33.42 Major Depressive Disorder, recurrent, full remission ?Symptoms ?Constant or frequent conflict with parents and/or siblings.  ?Decrease or loss of appetite.  ?Depressed or irritable mood. ?Diminished interest in or enjoyment of activities.  ?Displays deficits in parenting knowledge and skills.   ?History of chronic or recurrent depression for which the client has taken antidepressant medication, been hospitalized, had outpatient treatment, or had a course of electroconvulsive therapy. ?Lack of energy.   ?Lacks knowledge regarding reasonable expectations for a child's behavior at a given developmental level.  ?Low self-esteem. ?Psychomotor agitation or retardation.  ?Sleeplessness or hypersomnia.   ?Social withdrawal.   ?Medication Status ?instructed to contact psychiatrist  ?compliance  ?Safety ?none  ?If Suicidal or Homicidal State Action Taken: unspecified  ?Current Risk: low ?Medications ?unspecified ?Objectives ?Related Problem: Achieve a greater level of family connectedness. ?Description: Parents and child report an increased feeling of connectedness between them. ?Target Date: 2021-08-07 ?Frequency: Biweekly ?Modality: individual ?Progress: 70% ? ?Related Problem: Achieve a greater level of family connectedness. ?Description: Identify unresolved childhood issues that affect parenting and work toward their resolution. ?Target Date: 2021-08-07 ?Frequency: Biweekly ?Modality: individual ?Progress: 50% ?Planned Intervention: Assist the parents in working through issues from their own childhood that are unresolved.  ?Related Problem: Achieve a greater level of family connectedness. ?Description: Parents expand repertoire of parenting options. ?Target Date: 2021-08-07 ?Frequency: Biweekly ?Modality:  individual ?Progress: 40% ?Planned Intervention: Support, empower, monitor, and encourage the parents in implementing new strategies for parenting their child, giving feedback and redirection as needed.  ?Planned Intervention: Expand the parents' repertoire of intervention options by having them read material on parenting difficult children (e.g., The Difficult Child by Linton Rump and Firefighter; The Explosive Child by Carlota Raspberry; How to Handle a Hard-to-Handle Kid by [place]).  ?Related Problem: Achieve a greater level of family connectedness. ?Description: Develop skills to talk openly and effectively with the children. ?Target Date: 2021-08-07 ?Frequency: Biweekly ?Modality: individual ?Progress: 50% ? ?Related Problem: Achieve a greater level of family connectedness. ?Description: Verbalize a sense of increased skill, effectiveness, and confidence in parenting. ?Target Date: 2021-08-07 ?Frequency: Biweekly ?Modality: individual ?Progress: 50% ? ?Related Problem: Achieve a greater level of family connectedness. ?Description: Learn and implement parenting practices that have demonstrated effectiveness. ?Target Date: 2021-08-07 ?Frequency: Biweekly ?Modality: individual ?Progress: 40% ? ?Related Problem: Appropriately grieve the loss in order to normalize mood and to return to previously adaptive level of functioning. ?Description: Identify and replace thoughts and beliefs that support depression. ?Target Date: 2021-08-07 ?Frequency: Biweekly ?Modality: individual ?Progress: 60% ? ?Planned Intervention: Facilitate and reinforce the client's shift from biased depressive self-talk and beliefs to reality-based cognitive messages that enhance self-confidence and increase adaptive actions (see "Positive Self-Talk" in the Adult Psychotherapy Homework Planner by Bryn Gulling).  ?Planned Intervention: Assign the client to self-monitor thoughts, feelings, and actions in daily journal (e.g., "Negative Thoughts Trigger Negative Feelings" in  the Adult Psychotherapy Homework Planner by Bryn Gulling; "Daily Record of Dysfunctional Thoughts" in Cognitive Therapy of Depression by Lupita Shutter and Warren Lacy); process the journal material to challenge depressive thinking patterns and replace them with reality-based thoughts.  ?Related Problem: Appropriately grieve the loss in order to normalize mood and to return to previously adaptive level of functioning. ?Description: Learn and implement behavioral strategies to overcome depression. ?Target Date:  2021-08-07 ?Frequency: Biweekly ?Modality: individual ?Progress: 50% ?Planned Intervention: Assist the client in developing skills that increase the likelihood of deriving pleasure from behavioral activation (e.g., assertiveness skills, developing an exercise plan, less internal/more external focus, increased social involvement); reinforce success.  ?Related Problem: Appropriately grieve the loss in order to normalize mood and to return to previously adaptive level of functioning. ?Description: Implement mindfulness techniques for relapse prevention. ?Target Date: 2021-08-07 ?Frequency: Biweekly ?Modality: individual ?Progress: 30% ? ?Planned Intervention: Work to increase the client's new sense of well-being by building his/her personal strengths evident in their progress through therapy (or assign "Acknowledging My Strengths" and/or "What Are My Good Qualities?" in the Adult Psychotherapy Homework Planner by Bryn Gulling).  ?Related Problem: Appropriately grieve the loss in order to normalize mood and to return to previously adaptive level of functioning. ?Description: Learn and implement problem-solving and decision-making skills. ?Target Date: 2021-08-07 ?Frequency: Biweekly ?Modality: individual ?Progress: 50% ? ?Related Problem: Appropriately grieve the loss in order to normalize mood and to return to previously adaptive level of functioning. ?Description: Learn and implement conflict resolution skills to resolve  interpersonal problems. ?Target Date: 2021-08-07 ?Frequency: Biweekly ?Modality: individual ?Progress: 50% ? ?Related Problem: Appropriately grieve the loss in order to normalize mood and to return to previously adaptive level of functioning. ?Description: Increasingly verbalize hopeful and positive statements regarding self, others, and the future. ?Target Date: 2021-08-07 ?Frequency: Biweekly ?Modality: individual ?Progress: 50% ? ?Related Problem: Decrease the level of present conflict with parents while beginning to let go of or resolving past conflicts with them. ?Description: Identify own as well as others' role in the family conflicts. ?Target Date: 2021-08-07 ?Frequency: Biweekly ?Modality: individual ?Progress: 40% ? ?Related Problem: Decrease the level of present conflict with parents while beginning to let go of or resolving past conflicts with them. ?Description: Describe the conflicts and the causes of conflicts between self and parents. ?Target Date: 2021-08-07 ?Frequency: Biweekly ?Modality: individual ?Progress: 60% ? ?Client Response ?full compliance  ?Service Location ?Location, [address] [place], [place] [address]  ?Service Code ?cpt 53005R (95)  ?Assess/facilitate readiness to change  ?Rationally challenge thoughts or beliefs/cognitive restructuring  ?Facilitate problem solving  ?Identify automatic thoughts  ?Normalize/Reframe  ?Validate/empathize  ?Identify/label emotions  ?Mindfulness training ?Distress Tolerance Skills ?Comments  ? ?Vinnie Level and I met today in remote video (WebEx) individual face to face psychotherapy as an accommodation to the [place] pandemic. ? ?Distance Site:  Client?s Home ?Originating Site: Dr. Jannifer Franklin Remote Office ?Consent: Obtained verbal consent to transmit session remotely ? ?PCP:  Dr Dorothea Ogle ? ? ?Iliyah reports that she's has been feeling better.  But while her mood has improved, she is still struggling with fatigue.  We d/ that she did a sleep  study and it concluded that she didn't have sleep apnea.  We then focused on other contributing factors like exercise, thyroid and supplements (D and Methylated B Complex).  Jacquel agreed to speak with either her PCP

## 2021-07-06 DIAGNOSIS — Z713 Dietary counseling and surveillance: Secondary | ICD-10-CM | POA: Diagnosis not present

## 2021-07-09 DIAGNOSIS — F900 Attention-deficit hyperactivity disorder, predominantly inattentive type: Secondary | ICD-10-CM | POA: Diagnosis not present

## 2021-07-09 DIAGNOSIS — E538 Deficiency of other specified B group vitamins: Secondary | ICD-10-CM | POA: Diagnosis not present

## 2021-07-09 DIAGNOSIS — F1010 Alcohol abuse, uncomplicated: Secondary | ICD-10-CM | POA: Diagnosis not present

## 2021-07-09 DIAGNOSIS — E559 Vitamin D deficiency, unspecified: Secondary | ICD-10-CM | POA: Diagnosis not present

## 2021-07-09 DIAGNOSIS — F339 Major depressive disorder, recurrent, unspecified: Secondary | ICD-10-CM | POA: Diagnosis not present

## 2021-07-09 DIAGNOSIS — R6889 Other general symptoms and signs: Secondary | ICD-10-CM | POA: Diagnosis not present

## 2021-07-09 DIAGNOSIS — F411 Generalized anxiety disorder: Secondary | ICD-10-CM | POA: Diagnosis not present

## 2021-07-10 ENCOUNTER — Ambulatory Visit: Payer: Federal, State, Local not specified - PPO | Admitting: Psychology

## 2021-07-16 ENCOUNTER — Ambulatory Visit (INDEPENDENT_AMBULATORY_CARE_PROVIDER_SITE_OTHER): Payer: Federal, State, Local not specified - PPO | Admitting: Psychology

## 2021-07-16 DIAGNOSIS — F3342 Major depressive disorder, recurrent, in full remission: Secondary | ICD-10-CM

## 2021-07-16 NOTE — Progress Notes (Signed)
PROGRESS NOTE:  Patient Name: Krista [name] Date of Birth: [birth year]   MRN: [id number]  PCP: Caryl Ada   Date of Service: 07/16/2021 Start: 1:00 PM End:   1:55 PM  Annual Review: 08/07/2021  Diagnosis F 33.42 Major Depressive Disorder, recurrent, full remission Symptoms Constant or frequent conflict with parents and/or siblings.  Decrease or loss of appetite.  Depressed or irritable mood. Diminished interest in or enjoyment of activities.  Displays deficits in parenting knowledge and skills.   History of chronic or recurrent depression for which the client has taken antidepressant medication, been hospitalized, had outpatient treatment, or had a course of electroconvulsive therapy. Lack of energy.   Lacks knowledge regarding reasonable expectations for a child's behavior at a given developmental level.  Low self-esteem. Psychomotor agitation or retardation.  Sleeplessness or hypersomnia.   Social withdrawal.   Medication Status instructed to contact psychiatrist  compliance  Safety none  If Suicidal or Homicidal State Action Taken: unspecified  Current Risk: low Medications unspecified Objectives Related Problem: Achieve a greater level of family connectedness. Description: Parents and child report an increased feeling of connectedness between them. Target Date: 2021-08-07 Frequency: Biweekly Modality: individual Progress: 70%  Related Problem: Achieve a greater level of family connectedness. Description: Identify unresolved childhood issues that affect parenting and work toward their resolution. Target Date: 2021-08-07 Frequency: Biweekly Modality: individual Progress: 50% Planned Intervention: Assist the parents in working through issues from their own childhood that are unresolved.  Related Problem: Achieve a greater level of family connectedness. Description: Parents expand repertoire of parenting options. Target Date: 2021-08-07 Frequency:  Biweekly Modality: individual Progress: 40% Planned Intervention: Support, empower, monitor, and encourage the parents in implementing new strategies for parenting their child, giving feedback and redirection as needed.  Planned Intervention: Expand the parents' repertoire of intervention options by having them read material on parenting difficult children (e.g., The Difficult Child by Linton Rump and Firefighter; The Explosive Child by Carlota Raspberry; How to Handle a Hard-to-Handle Kid by [place]).  Related Problem: Achieve a greater level of family connectedness. Description: Develop skills to talk openly and effectively with the children. Target Date: 2021-08-07 Frequency: Biweekly Modality: individual Progress: 50%  Related Problem: Achieve a greater level of family connectedness. Description: Verbalize a sense of increased skill, effectiveness, and confidence in parenting. Target Date: 2021-08-07 Frequency: Biweekly Modality: individual Progress: 50%  Related Problem: Achieve a greater level of family connectedness. Description: Learn and implement parenting practices that have demonstrated effectiveness. Target Date: 2021-08-07 Frequency: Biweekly Modality: individual Progress: 40%  Related Problem: Appropriately grieve the loss in order to normalize mood and to return to previously adaptive level of functioning. Description: Identify and replace thoughts and beliefs that support depression. Target Date: 2021-08-07 Frequency: Biweekly Modality: individual Progress: 60%  Planned Intervention: Facilitate and reinforce the client's shift from biased depressive self-talk and beliefs to reality-based cognitive messages that enhance self-confidence and increase adaptive actions (see "Positive Self-Talk" in the Adult Psychotherapy Homework Planner by Bryn Gulling).  Planned Intervention: Assign the client to self-monitor thoughts, feelings, and actions in daily journal (e.g., "Negative Thoughts Trigger  Negative Feelings" in the Adult Psychotherapy Homework Planner by Bryn Gulling; "Daily Record of Dysfunctional Thoughts" in Cognitive Therapy of Depression by Lupita Shutter and Warren Lacy); process the journal material to challenge depressive thinking patterns and replace them with reality-based thoughts.  Related Problem: Appropriately grieve the loss in order to normalize mood and to return to previously adaptive level of functioning. Description: Learn and implement behavioral strategies to  overcome depression. Target Date: 2021-08-07 Frequency: Biweekly Modality: individual Progress: 50% Planned Intervention: Assist the client in developing skills that increase the likelihood of deriving pleasure from behavioral activation (e.g., assertiveness skills, developing an exercise plan, less internal/more external focus, increased social involvement); reinforce success.  Related Problem: Appropriately grieve the loss in order to normalize mood and to return to previously adaptive level of functioning. Description: Implement mindfulness techniques for relapse prevention. Target Date: 2021-08-07 Frequency: Biweekly Modality: individual Progress: 30%  Planned Intervention: Work to increase the client's new sense of well-being by building his/her personal strengths evident in their progress through therapy (or assign "Acknowledging My Strengths" and/or "What Are My Good Qualities?" in the Adult Psychotherapy Homework Planner by Bryn Gulling).  Related Problem: Appropriately grieve the loss in order to normalize mood and to return to previously adaptive level of functioning. Description: Learn and implement problem-solving and decision-making skills. Target Date: 2021-08-07 Frequency: Biweekly Modality: individual Progress: 50%  Related Problem: Appropriately grieve the loss in order to normalize mood and to return to previously adaptive level of functioning. Description: Learn and implement conflict resolution  skills to resolve interpersonal problems. Target Date: 2021-08-07 Frequency: Biweekly Modality: individual Progress: 50%  Related Problem: Appropriately grieve the loss in order to normalize mood and to return to previously adaptive level of functioning. Description: Increasingly verbalize hopeful and positive statements regarding self, others, and the future. Target Date: 2021-08-07 Frequency: Biweekly Modality: individual Progress: 50%  Related Problem: Decrease the level of present conflict with parents while beginning to let go of or resolving past conflicts with them. Description: Identify own as well as others' role in the family conflicts. Target Date: 2021-08-07 Frequency: Biweekly Modality: individual Progress: 40%  Related Problem: Decrease the level of present conflict with parents while beginning to let go of or resolving past conflicts with them. Description: Describe the conflicts and the causes of conflicts between self and parents. Target Date: 2021-08-07 Frequency: Biweekly Modality: individual Progress: 60%  Client Response full compliance  Service Location Location, [address] [place], [place] [address]  Service Code cpt [phone])  Assess/facilitate readiness to change  Rationally challenge thoughts or beliefs/cognitive restructuring  Facilitate problem solving  Identify automatic thoughts  Normalize/Reframe  Validate/empathize  Identify/label emotions  Mindfulness training Distress Tolerance Skills Comments   Takina Busser and I met today in remote video (WebEx) individual face to face psychotherapy as an accommodation to the [place] pandemic.  Distance Site:  Client's Home Originating Site: Dr. Jannifer Franklin Remote Office Consent: Obtained verbal consent to transmit session remotely  PCP:  Dr Ortencia Kick c/o of fatigue and feeling pain in his joints.  Apparently, she has been in pain since last week.  We talked about her health,  health history and how these issues intersect with her mood.  I encouraged her to reach out to her PCP for a check up as well as get a referral to the appropriate specialist (ie., rheumatologist, endocrinologist).   Home Practice: plan a social outing   Progress: Patient's mood states have improved aeb decreased feelings of dread around work, level of dysphoria, irritability, negative thinking and increased motivation to pursue social interactions.  Pt demonstrates increased use of coping strategies and effective parenting skills.  Pt continues to experience premenstrual dysphoria.  ______________________________  Medications:   [place], [place], [place], [place]   Atenolol 50 mg PRN Wellbutrin 450 mg Lexapro 40 mg B12 Complex - methylated Omega 3 Pre-Natal Vitamin  Vitamin D + K   Royetta Crochet, PhD

## 2021-07-20 DIAGNOSIS — Z713 Dietary counseling and surveillance: Secondary | ICD-10-CM | POA: Diagnosis not present

## 2021-07-26 DIAGNOSIS — Z79899 Other long term (current) drug therapy: Secondary | ICD-10-CM | POA: Diagnosis not present

## 2021-07-26 DIAGNOSIS — R5383 Other fatigue: Secondary | ICD-10-CM | POA: Diagnosis not present

## 2021-07-26 DIAGNOSIS — M2550 Pain in unspecified joint: Secondary | ICD-10-CM | POA: Diagnosis not present

## 2021-07-30 ENCOUNTER — Ambulatory Visit (INDEPENDENT_AMBULATORY_CARE_PROVIDER_SITE_OTHER): Payer: Federal, State, Local not specified - PPO | Admitting: Psychology

## 2021-07-30 DIAGNOSIS — F3342 Major depressive disorder, recurrent, in full remission: Secondary | ICD-10-CM

## 2021-07-30 NOTE — Progress Notes (Signed)
PROGRESS NOTE:  Annual Review  Patient Name: Krista [name] Date of Birth: 1985-06-22   MRN: [id number]  PCP: Krista Hurl, PA-C  PCP: Krista [name] Date of Service: 07/30/2021 Start: 1:00 PM End:   1:55 PM  Annual Review: 08/08/2022   Today I met with  Krista [name] in remote video (WebEx) face-to-face individual psychotherapy.   Distance Site: Client's Home Orginating Site: Dr Krista [name] Remote Office Consent: Obtained verbal consent to transmit  session remotely  Reason for Visit /Presenting Problem:  Krista [name] is a 36 year old SBF who initially presented with depression following a second miscarriage.  She has been engaged and consistent attending her therapy appointments.  While she has improved her level of depression, she periodically finds herself struggling.  It has become apparent that changes in hormone levels impact her moods states in a manner consistent with PMDD.  As a single mother of two young boys, treatment has also focused on parenting issues.  Her eldest son is diagnosed with ADHD.  In the past, he was in treatment with Krista [name].  He can be particularly challenging for her to manage.  Krista [name] has learned some new parenting skills which have helped improve her comfort in dealing with misbehaviors.  Therapy has focused on strenghting the attachment between mother and sons as well as on building solid parenting skills.  It is beleived that patient would benefit from continued bi-weekly individual psychotherapy to further improve depression, anxiety and coping skills and strategies.  Treatment at times will focus on processing early childhood trauma and will examine how it continues to impact her parenting and interpersonal relationships.  Ms Mione will continue to f/u with her provider at the [facility] to oversee her medications.  It is recommended that she also participate in EMDR and family therapy session as needed.  Background History:  Krista [name] is single  mother and has been alone for the last 12 years.  She is the mother of two boys.  Krista [name] (13)  he is in the 10th grade at [employer].  He has ADHD and can be a lot to handle.  Krista [name] is very smart and things come easy to him, he is sweet and is a great big brother.  Krista [name] (10) he is in the 6th grade and attend [employer].  He is a mama's boy.  He likes to explore and see show things work.  He is very social and easy to get along with.  School is a Arts administrator for him.    The boys' father used to  just pops up whenever he felt like it.   While there has been little change in the her children's father's behavior, Krista [name] has improved in her ability to set limits and boundaries.  She has also improved in her ability to communicate with him her concerns about the boys.   Mental Status Exam: Appearance:   Casual and Neat     Behavior:  Appropriate  Motor:  Normal  Speech/Language:   NA  Affect:  Appropriate  Mood:  normal  Thought process:  normal  Thought content:    WNL  Sensory/Perceptual disturbances:    WNL  Orientation:  oriented to person, place, time/date, and situation  Attention:  Good  Concentration:  Good  Memory:  WNL  Fund of knowledge:   Good  Insight:    Fair  Judgment:   Good  Impulse Control:  Good    Past Psychiatric  History:   Previous psychological history is significant for depression Outpatient Providers:none History of Psych Hospitalization: No  Psychological Testing: Mood:  BDI    Living situation: the patient lives with their family  Sexual Orientation: Straight  Relationship Status: single  Name of spouse / other:n/a If a parent, number of children / ages: 2   Diagnoses:  Recurrent major depressive disorder, in full remission ([place])     Individualized Treatment Plan      Strengths: good work ethic, honest, generous and giving, reliable, good reader and easy to talk to  Supports: sister, grandparents & friends    Goal/Needs for Treatment:  In order of importance to patient 1) Learn and implement behavioral strategies to prevent relapse of depression. 2) Learn and expand repertoire of parenting options. 3) Assist the client in developing skills that increase the likelihood of deriving pleasure from behavioral activation (e.g., assertiveness skills, developing an exercise plan, less internal/more external focus, increased social involvement); reinforce success.  4) Implement mindfulness techniques for relapse prevention.   Client Statement of Needs:  Pt states she needs assistance with "getting out of my head" and "feeling in charge of my life" (ie., vs being controlled by )life.   Treatment Level: Individual Outpatient Psychotherapy  Symptoms:  Diminished interest in or enjoyment of activities. Depressed or irritable mood.  Displays deficits in parenting knowledge and skills.   History of chronic or recurrent depression for which the client has taken antidepressant medication, been hospitalized, had outpatient treatment, or had a course of electroconvulsive therapy. Lack of energy.   Lacks knowledge regarding reasonable expectations for a child's behavior at a given developmental level.  Low self-esteem. Psychomotor agitation or retardation.  Sleeplessness or hypersomnia.   Social withdrawal.  Client Treatment Preferences: Continue with present therapist   Healthcare consumer's goal for treatment:  Psychologist, Krista [name], Ph.D will support the patient's ability to achieve the goals identified. Cognitive Behavioral Therapy, Dialectical Behavioral Therapy, Motivational Interviewing, parent training, and other evidenced-based practices will be used to promote progress towards healthy functioning.   Healthcare consumer, Krista [name] will: Actively participate in therapy, working towards healthy functioning.    *Justification for Continuation/Discontinuation of Goal: R=Revised, O=Ongoing,  A=Achieved, D=Discontinued  Goal 1) Learn and implement behavioral strategies to prevent relapse of depression. 5 Point Likert rating baseline date: 08/07/2021 Target Date Goal Was reviewed Status Code Progress towards goal/Likert rating   08/08/2022  07/30/2021         O 2/5 - pt has learn some strategies but use them inconsistently             Goal 2) Learn and expand repertoire of parenting options. Likert rating baseline date: 08/07/2021 Target Date Goal Was reviewed Status Code Progress towards goal/Likert rating   08/08/2022  07/30/2021         O 2/5 - pt has learn some strategies but use them inconsistently             Goal 3) Assist the client in developing skills that increase the likelihood of deriving pleasure from behavioral activation (e.g., assertiveness skills, developing an exercise plan, less internal/more external focus, increased social involvement); reinforce success.  Likert rating baseline date: 08/07/2021 Target Date Goal Was reviewed Status Code Progress towards goal/Likert rating   08/08/2022  07/30/2021         O 2/5 - pt has learn some strategies but use them inconsistently             Goal 4)  Implement mindfulness techniques for relapse prevention. Likert rating baseline date: 08/07/2021 Target Date Goal Was reviewed Status Code Progress towards goal/Likert rating   08/08/2022  07/30/2021           N               This plan has been reviewed and created by the following participants:  This plan will be reviewed at least every 12 months. Date Behavioral Health Clinician Date Guardian/Patient   07/30/2021 Krista [name]   07/30/2021 Brent General, PhD

## 2021-08-06 DIAGNOSIS — M2550 Pain in unspecified joint: Secondary | ICD-10-CM | POA: Diagnosis not present

## 2021-08-13 ENCOUNTER — Ambulatory Visit: Payer: Federal, State, Local not specified - PPO | Admitting: Psychology

## 2021-08-24 DIAGNOSIS — Z713 Dietary counseling and surveillance: Secondary | ICD-10-CM | POA: Diagnosis not present

## 2021-08-27 ENCOUNTER — Ambulatory Visit: Payer: Federal, State, Local not specified - PPO | Admitting: Psychology

## 2021-09-10 ENCOUNTER — Ambulatory Visit (INDEPENDENT_AMBULATORY_CARE_PROVIDER_SITE_OTHER): Payer: Federal, State, Local not specified - PPO | Admitting: Psychology

## 2021-09-10 DIAGNOSIS — F3342 Major depressive disorder, recurrent, in full remission: Secondary | ICD-10-CM | POA: Diagnosis not present
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
## 2021-09-24 ENCOUNTER — Ambulatory Visit (INDEPENDENT_AMBULATORY_CARE_PROVIDER_SITE_OTHER): Payer: Federal, State, Local not specified - PPO | Admitting: Psychology

## 2021-09-24 DIAGNOSIS — F330 Major depressive disorder, recurrent, mild: Secondary | ICD-10-CM | POA: Diagnosis not present

## 2021-09-24 NOTE — Progress Notes (Signed)
PROGRESS NOTE  Patient Name: Krista [name] Date of Birth: 06/26/1985   MRN: [id number]  PCP: Krista Hurl, PA-C  PCP: Krista [name]  Date of Service: 09/24/2021 Start: 1:00 PM End:   1:55 PM  Annual Review: 08/08/2022   Today I met with  Krista [name] in remote video (WebEx) face-to-face individual psychotherapy.   Distance Site: Client's Home Orginating Site: Dr Krista [name] Remote Office Consent: Obtained verbal consent to transmit  session remotely  Presenting Problem:  Krista [name] is a 36 year old SBF who initially presented with depression following a second miscarriage.  She has been engaged and consistent attending her therapy appointments.  While she has improved her level of depression, she periodically finds herself struggling.  It has become apparent that changes in hormone levels impact her moods states in a manner consistent with PMDD.  As a single mother of two young boys, treatment has also focused on parenting issues.  Her eldest son is diagnosed with ADHD.  In the past, he was in treatment with Krista [name].  He can be particularly challenging for her to manage.  Krista [name] has learned some new parenting skills which have helped improve her comfort in dealing with misbehaviors.  Therapy has focused on strenghting the attachment between mother and sons as well as on building solid parenting skills.  It is beleived that patient would benefit from continued bi-weekly individual psychotherapy to further improve depression, anxiety and coping skills and strategies.  Treatment at times will focus on processing early childhood trauma and will examine how it continues to impact her parenting and interpersonal relationships.  Krista [name] will continue to f/u with her provider at the [facility] to oversee her medications.  It is recommended that she also participate in EMDR and family therapy session as needed.  Background History:  Krista [name] is single mother and has been alone for  the last 12 years.  She is the mother of two boys.  Krista [name] (13)  he is in the 10th grade at [employer].  He has ADHD and can be a lot to handle.  Krista [name], he is sweet and is a great big brother.  Krista [name] (10) he is in the 6th grade and attend [employer].  He is a mama's boy.  He likes to explore and see show things work.  He is very social and easy to get along with.  School is a Arts administrator for [name].    The boys' father used to  just pops up whenever he felt like it.   While there has been little change in the her children's father's behavior, Krista [name] has improved in her ability to set limits and boundaries.  She has also improved in her ability to communicate with [name] her concerns about the boys.   Mental Status Exam: Appearance:   Casual and Neat     Behavior:  Appropriate  Motor:  Normal  Speech/Language:   NA  Affect:  Appropriate  Mood:  normal  Thought process:  normal  Thought content:    WNL  Sensory/Perceptual disturbances:    WNL  Orientation:  oriented to person, place, time/date, and situation  Attention:  Good  Concentration:  Good  Memory:  WNL  Fund of knowledge:   Good  Insight:    Fair  Judgment:   Good  Impulse Control:  Good    Past Psychiatric History:   Previous psychological  history is significant for depression Outpatient Providers:none History of Psych Hospitalization: No  Psychological Testing: Mood:  BDI    Living situation: the patient lives with their family  Sexual Orientation: Straight  Relationship Status: single  Name of spouse / other:n/a If a parent, number of children / ages: 2   Diagnoses:  Depression, major, recurrent, mild ([place])     Individualized Treatment Plan      Strengths: good work ethic, honest, generous and giving, reliable, good reader and easy to talk to  Supports: sister, grandparents & friends   Goal/Needs for Treatment:  In order of importance to  patient 1) Learn and implement behavioral strategies to prevent relapse of depression. 2) Learn and expand repertoire of parenting options. 3) Assist the client in developing skills that increase the likelihood of deriving pleasure from behavioral activation (e.g., assertiveness skills, developing an exercise plan, less internal/more external focus, increased social involvement); reinforce success.  4) Implement mindfulness techniques for relapse prevention.   Client Statement of Needs:  Pt states she needs assistance with "getting out of my head" and "feeling in charge of my life" (ie., vs being controlled by )life.   Treatment Level: Individual Outpatient Psychotherapy  Symptoms:  Diminished interest in or enjoyment of activities. Depressed or irritable mood.  Displays deficits in parenting knowledge and skills.   History of chronic or recurrent depression for which the client has taken antidepressant medication, been hospitalized, had outpatient treatment, or had a course of electroconvulsive therapy. Lack of energy.   Lacks knowledge regarding reasonable expectations for a child's behavior at a given developmental level.  Low self-esteem. Psychomotor agitation or retardation.  Sleeplessness or hypersomnia.   Social withdrawal.  Client Treatment Preferences: Continue with present therapist   Healthcare consumer's goal for treatment:  Psychologist, Krista [name], Ph.D will support the patient's ability to achieve the goals identified. Cognitive Behavioral Therapy, Dialectical Behavioral Therapy, Motivational Interviewing, parent training, and other evidenced-based practices will be used to promote progress towards healthy functioning.   Healthcare consumer, Krista [name] will: Actively participate in therapy, working towards healthy functioning.    *Justification for Continuation/Discontinuation of Goal: R=Revised, O=Ongoing, A=Achieved, D=Discontinued  Goal 1) Learn and implement  behavioral strategies to prevent relapse of depression. 5 Point Likert rating baseline date: 08/07/2021 Target Date Goal Was reviewed Status Code Progress towards goal/Likert rating   08/08/2022  07/30/2021         O 2/5 - pt has learn some strategies but use them inconsistently             Goal 2) Learn and expand repertoire of parenting options. Likert rating baseline date: 08/07/2021 Target Date Goal Was reviewed Status Code Progress towards goal/Likert rating   08/08/2022  07/30/2021         O 2/5 - pt has learn some strategies but use them inconsistently             Goal 3) Assist the client in developing skills that increase the likelihood of deriving pleasure from behavioral activation (e.g., assertiveness skills, developing an exercise plan, less internal/more external focus, increased social involvement); reinforce success.  Likert rating baseline date: 08/07/2021 Target Date Goal Was reviewed Status Code Progress towards goal/Likert rating   08/08/2022  07/30/2021         O 2/5 - pt has learn some strategies but use them inconsistently             Goal 4)  Implement mindfulness techniques for relapse prevention. Likert  rating baseline date: 08/07/2021 Target Date Goal Was reviewed Status Code Progress towards goal/Likert rating   08/08/2022  07/30/2021           N               This plan has been reviewed and created by the following participants:  This plan will be reviewed at least every 12 months. Date Behavioral Health Clinician Date Guardian/Patient   07/30/2021 Krista [name]   07/30/2021 Jorja Loa reports that she drove to [place] pick up her boys.  She took a weeks vacation and stayed a few days with her sister and family.  She shared one thing that she had trouble with about her sister.  We d/e/p the event and how she might respond in the future should it occur again.  Deidre also talked about another incident with her son's father.  Raquel Sarna will start  a new school this Fall.  He will attend [employer], he has orientation but he was nervous about placement testing.  I offered some suggestions for how to help [name] manage his test anxiety.  Kori notes that she has been feeling pre-menstrual.  She is getting better at using her skills to deal with this time of the month even if she has to push herself.  She agreed to track how many times she asked them if they're ok each day.  She counted up to seven times a day.  She states that she is more aware now and is making the attempt to check in by asking "what are you up to?" Etc.     Home Practice:  She agreed to track how many times she asked them if they're ok each day.   Krista Crochet, PhD  _____________________________________  Hornell Clinton

## 2021-10-05 DIAGNOSIS — Z713 Dietary counseling and surveillance: Secondary | ICD-10-CM | POA: Diagnosis not present

## 2021-10-08 ENCOUNTER — Ambulatory Visit: Payer: Federal, State, Local not specified - PPO | Admitting: Psychology

## 2021-10-11 DIAGNOSIS — F900 Attention-deficit hyperactivity disorder, predominantly inattentive type: Secondary | ICD-10-CM | POA: Diagnosis not present

## 2021-10-11 DIAGNOSIS — F411 Generalized anxiety disorder: Secondary | ICD-10-CM | POA: Diagnosis not present

## 2021-10-11 DIAGNOSIS — F1010 Alcohol abuse, uncomplicated: Secondary | ICD-10-CM | POA: Diagnosis not present

## 2021-10-11 DIAGNOSIS — F339 Major depressive disorder, recurrent, unspecified: Secondary | ICD-10-CM | POA: Diagnosis not present

## 2021-10-22 ENCOUNTER — Ambulatory Visit (INDEPENDENT_AMBULATORY_CARE_PROVIDER_SITE_OTHER): Payer: Federal, State, Local not specified - PPO | Admitting: Psychology

## 2021-10-22 DIAGNOSIS — F3342 Major depressive disorder, recurrent, in full remission: Secondary | ICD-10-CM | POA: Diagnosis not present

## 2021-10-22 NOTE — Progress Notes (Signed)
PROGRESS NOTE:  Name: Krista [name] Date: 10/22/2021 MRN: [id number] DOB: [birth year] PCP: Carlena Hurl, PA-C  Time spent:  Start: 1:00 PM End:   1:55 PM  Annual Review: 08/08/2022   Today I met with  Krista [name] in remote video (WebEx) face-to-face individual psychotherapy.   Distance Site: Client's Home Orginating Site: Dr Jannifer Franklin Remote Office Consent: Obtained verbal consent to transmit  session remotely  Presenting Problem:  Krista [name] is a 36 year old SBF who initially presented with depression following a second miscarriage.  She has been engaged and consistent attending her therapy appointments.  While she has improved her level of depression, she periodically finds herself struggling.  It has become apparent that changes in hormone levels impact her moods states in a manner consistent with PMDD.  As a single mother of two young boys, treatment has also focused on parenting issues.  Her eldest son is diagnosed with ADHD.  In the past, he was in treatment with Bambi Cottle.  He can be particularly challenging for her to manage.  Blessen has learned some new parenting skills which have helped improve her comfort in dealing with misbehaviors.  Therapy has focused on strenghting the attachment between mother and sons as well as on building solid parenting skills.  It is beleived that patient would benefit from continued bi-weekly individual psychotherapy to further improve depression, anxiety and coping skills and strategies.  Treatment at times will focus on processing early childhood trauma and will examine how it continues to impact her parenting and interpersonal relationships.  Ms Kauffmann will continue to f/u with her provider at the [facility] to oversee her medications.  It is recommended that she also participate in EMDR and family therapy session as needed.  Background History:  [name] is single mother and has been alone for the last 12 years.  She is the mother of  two boys.  Krista [name] (13)  he is in the 10th grade at [employer].  He has ADHD and can be a lot to handle.  Krista [name] is very smart and things come easy to him, he is sweet and is a great big brother.  Krista [name] (10) he is in the 6th grade and attend [employer].  He is a mama's boy.  He likes to explore and see show things work.  He is very social and easy to get along with.  School is a Arts administrator for him.    The boys' father used to  just pops up whenever he felt like it.   While there has been little change in the her children's father's behavior, Krista [name] has improved in her ability to set limits and boundaries.  She has also improved in her ability to communicate with him her concerns about the boys.   Mental Status Exam: Appearance:   Casual and Neat     Behavior:  Appropriate  Motor:  Normal  Speech/Language:   NA  Affect:  Appropriate  Mood:  normal  Thought process:  normal  Thought content:    WNL  Sensory/Perceptual disturbances:    WNL  Orientation:  oriented to person, place, time/date, and situation  Attention:  Good  Concentration:  Good  Memory:  WNL  Fund of knowledge:   Good  Insight:    Fair  Judgment:   Good  Impulse Control:  Good    Past Psychiatric History:   Previous psychological history is significant for depression Outpatient Providers:none History of  Psych Hospitalization: No  Psychological Testing: Mood:  BDI    Living situation: the patient lives with their family  Sexual Orientation: Straight  Relationship Status: single  Name of spouse / other:n/a If a parent, number of children / ages: 2   Diagnoses:  Recurrent major depressive disorder, in full remission ([place])     Individualized Treatment Plan      Strengths: good work ethic, honest, generous and giving, reliable, good reader and easy to talk to  Supports: sister, grandparents & friends   Goal/Needs for Treatment:  In order of importance to patient 1) Learn and  implement behavioral strategies to prevent relapse of depression. 2) Learn and expand repertoire of parenting options. 3) Assist the client in developing skills that increase the likelihood of deriving pleasure from behavioral activation (e.g., assertiveness skills, developing an exercise plan, less internal/more external focus, increased social involvement); reinforce success.  4) Implement mindfulness techniques for relapse prevention.   Client Statement of Needs:  Pt states she needs assistance with "getting out of my head" and "feeling in charge of my life" (ie., vs being controlled by )life.   Treatment Level: Individual Outpatient Psychotherapy  Symptoms:  Diminished interest in or enjoyment of activities. Depressed or irritable mood.  Displays deficits in parenting knowledge and skills.   History of chronic or recurrent depression for which the client has taken antidepressant medication, been hospitalized, had outpatient treatment, or had a course of electroconvulsive therapy. Lack of energy.   Lacks knowledge regarding reasonable expectations for a child's behavior at a given developmental level.  Low self-esteem. Psychomotor agitation or retardation.  Sleeplessness or hypersomnia.   Social withdrawal.  Client Treatment Preferences: Continue with present therapist   Healthcare consumer's goal for treatment:  Psychologist, Royetta Crochet, Ph.D will support the patient's ability to achieve the goals identified. Cognitive Behavioral Therapy, Dialectical Behavioral Therapy, Motivational Interviewing, parent training, and other evidenced-based practices will be used to promote progress towards healthy functioning.   Healthcare consumer, Kiva Norland will: Actively participate in therapy, working towards healthy functioning.    *Justification for Continuation/Discontinuation of Goal: R=Revised, O=Ongoing, A=Achieved, D=Discontinued  Goal 1) Learn and implement behavioral strategies to  prevent relapse of depression. 5 Point Likert rating baseline date: 08/07/2021 Target Date Goal Was reviewed Status Code Progress towards goal/Likert rating   08/08/2022  07/30/2021         O 2/5 - pt has learn some strategies but use them inconsistently             Goal 2) Learn and expand repertoire of parenting options. Likert rating baseline date: 08/07/2021 Target Date Goal Was reviewed Status Code Progress towards goal/Likert rating   08/08/2022  07/30/2021         O 2/5 - pt has learn some strategies but use them inconsistently             Goal 3) Assist the client in developing skills that increase the likelihood of deriving pleasure from behavioral activation (e.g., assertiveness skills, developing an exercise plan, less internal/more external focus, increased social involvement); reinforce success.  Likert rating baseline date: 08/07/2021 Target Date Goal Was reviewed Status Code Progress towards goal/Likert rating   08/08/2022  07/30/2021         O 2/5 - pt has learn some strategies but use them inconsistently             Goal 4)  Implement mindfulness techniques for relapse prevention. Likert rating baseline date: 08/07/2021 Target Date  Goal Was reviewed Status Code Progress towards goal/Likert rating   08/08/2022  07/30/2021           N               This plan has been reviewed and created by the following participants:  This plan will be reviewed at least every 12 months. Date Behavioral Health Clinician Date Guardian/Patient   07/30/2021 Royetta Crochet   07/30/2021 Vinnie Level                   I was informed that  Nolita had had an outstanding balance.  I shared the information I was given and encouraged her to speak to the billing office to clarify any problems.  Aydia's youngest son started school last week.  He has been home schooling since [place] and it is a big adjustment.  We talked about how to help his adjustment.    Shereka states that a friend has given her a few  gifts and it made her feel uncomfortable.  We d/e/p why she felt confused.  We made connections between the past and the present.  We then talked about how to gain access to her feelings in the present.     Home Practice:  She agreed to track how many times she asked them if they're ok each day.   Royetta Crochet, PhD  _____________________________________  [facility] (11th) -   [facility] (7th) - [employer]

## 2021-10-30 DIAGNOSIS — L700 Acne vulgaris: Secondary | ICD-10-CM | POA: Diagnosis not present

## 2021-10-31 ENCOUNTER — Encounter: Payer: Self-pay | Admitting: Internal Medicine

## 2021-11-05 ENCOUNTER — Ambulatory Visit: Payer: Federal, State, Local not specified - PPO | Admitting: Psychology

## 2021-11-19 ENCOUNTER — Ambulatory Visit: Payer: Self-pay | Admitting: Psychology

## 2021-11-19 ENCOUNTER — Emergency Department (HOSPITAL_COMMUNITY)
Admission: EM | Admit: 2021-11-19 | Discharge: 2021-11-20 | Disposition: A | Discharge status: Home / Self Care | Payer: Federal, State, Local not specified - PPO | Attending: Emergency Medicine | Admitting: Emergency Medicine

## 2021-11-19 DIAGNOSIS — Z7951 Long term (current) use of inhaled steroids: Secondary | ICD-10-CM | POA: Insufficient documentation

## 2021-11-19 DIAGNOSIS — R059 Cough, unspecified: Secondary | ICD-10-CM | POA: Insufficient documentation

## 2021-11-19 DIAGNOSIS — I10 Essential (primary) hypertension: Secondary | ICD-10-CM | POA: Insufficient documentation

## 2021-11-19 DIAGNOSIS — R0789 Other chest pain: Secondary | ICD-10-CM | POA: Diagnosis not present

## 2021-11-19 DIAGNOSIS — R0981 Nasal congestion: Secondary | ICD-10-CM | POA: Insufficient documentation

## 2021-11-19 DIAGNOSIS — R5383 Other fatigue: Secondary | ICD-10-CM | POA: Insufficient documentation

## 2021-11-19 DIAGNOSIS — Z20822 Contact with and (suspected) exposure to covid-19: Secondary | ICD-10-CM | POA: Insufficient documentation

## 2021-11-19 DIAGNOSIS — R0602 Shortness of breath: Secondary | ICD-10-CM | POA: Diagnosis not present

## 2021-11-19 DIAGNOSIS — J069 Acute upper respiratory infection, unspecified: Secondary | ICD-10-CM

## 2021-11-19 DIAGNOSIS — J45909 Unspecified asthma, uncomplicated: Secondary | ICD-10-CM | POA: Diagnosis not present

## 2021-11-19 DIAGNOSIS — R0989 Other specified symptoms and signs involving the circulatory and respiratory systems: Secondary | ICD-10-CM | POA: Diagnosis not present

## 2021-11-19 DIAGNOSIS — R079 Chest pain, unspecified: Secondary | ICD-10-CM | POA: Diagnosis not present

## 2021-11-20 ENCOUNTER — Emergency Department (HOSPITAL_COMMUNITY): Payer: Federal, State, Local not specified - PPO

## 2021-11-20 ENCOUNTER — Other Ambulatory Visit: Payer: Self-pay

## 2021-11-20 ENCOUNTER — Encounter (HOSPITAL_COMMUNITY): Payer: Self-pay | Admitting: Emergency Medicine

## 2021-11-20 DIAGNOSIS — R0602 Shortness of breath: Secondary | ICD-10-CM | POA: Diagnosis not present

## 2021-11-20 DIAGNOSIS — R079 Chest pain, unspecified: Secondary | ICD-10-CM | POA: Diagnosis not present

## 2021-11-20 LAB — RESP PANEL BY RT-PCR (FLU A&B, COVID) ARPGX2
Influenza A by PCR: NEGATIVE
Influenza B by PCR: NEGATIVE
SARS Coronavirus 2 by RT PCR: NEGATIVE

## 2021-11-20 LAB — CBC WITH DIFFERENTIAL/PLATELET
Abs Immature Granulocytes: 0.03 10*3/uL (ref 0.00–0.07)
Basophils Absolute: 0 10*3/uL (ref 0.0–0.1)
Basophils Relative: 0 %
Eosinophils Absolute: 0.6 10*3/uL — ABNORMAL HIGH (ref 0.0–0.5)
Eosinophils Relative: 7 %
HCT: 37.8 % (ref 36.0–46.0)
Hemoglobin: 12.7 g/dL (ref 12.0–15.0)
Immature Granulocytes: 0 %
Lymphocytes Relative: 14 %
Lymphs Abs: 1.2 10*3/uL (ref 0.7–4.0)
MCH: 32.1 pg (ref 26.0–34.0)
MCHC: 33.6 g/dL (ref 30.0–36.0)
MCV: 95.5 fL (ref 80.0–100.0)
Monocytes Absolute: 1.2 10*3/uL — ABNORMAL HIGH (ref 0.1–1.0)
Monocytes Relative: 13 %
Neutro Abs: 6 10*3/uL (ref 1.7–7.7)
Neutrophils Relative %: 66 %
Platelets: 259 10*3/uL (ref 150–400)
RBC: 3.96 MIL/uL (ref 3.87–5.11)
RDW: 11.9 % (ref 11.5–15.5)
WBC: 9 10*3/uL (ref 4.0–10.5)
nRBC: 0 % (ref 0.0–0.2)

## 2021-11-20 LAB — COMPREHENSIVE METABOLIC PANEL
ALT: 12 U/L (ref 0–44)
AST: 13 U/L — ABNORMAL LOW (ref 15–41)
Albumin: 3.7 g/dL (ref 3.5–5.0)
Alkaline Phosphatase: 76 U/L (ref 38–126)
Anion gap: 7 (ref 5–15)
BUN: <5 mg/dL — ABNORMAL LOW (ref 6–20)
CO2: 26 mmol/L (ref 22–32)
Calcium: 9.1 mg/dL (ref 8.9–10.3)
Chloride: 106 mmol/L (ref 98–111)
Creatinine, Ser: 0.82 mg/dL (ref 0.44–1.00)
GFR, Estimated: >60 mL/min (ref >60)
Glucose, Bld: 126 mg/dL — ABNORMAL HIGH (ref 70–99)
Potassium: 3.8 mmol/L (ref 3.5–5.1)
Sodium: 139 mmol/L (ref 135–145)
Total Bilirubin: 0.7 mg/dL (ref 0.3–1.2)
Total Protein: 7.1 g/dL (ref 6.5–8.1)

## 2021-11-20 LAB — I-STAT BETA HCG BLOOD, ED (MC, WL, AP ONLY): I-stat hCG, quantitative: <5 m[IU]/mL (ref <5)

## 2021-11-20 LAB — TROPONIN I (HIGH SENSITIVITY): Troponin I (High Sensitivity): 3 ng/L (ref <18)

## 2021-11-20 MED ORDER — AEROCHAMBER PLUS FLO-VU MEDIUM MISC
1.0000 | Freq: Once | Status: DC
  Administered 2021-11-20: 1
  Filled 2021-11-20: qty 1

## 2021-11-20 MED ORDER — ALBUTEROL SULFATE HFA 108 (90 BASE) MCG/ACT IN AERS
4.0000 | INHALATION_SPRAY | Freq: Once | RESPIRATORY_TRACT | Status: AC
  Administered 2021-11-20: 4 via RESPIRATORY_TRACT
  Filled 2021-11-20: qty 6.7

## 2021-11-20 MED ORDER — AEROCHAMBER PLUS FLO-VU LARGE MISC
1.0000 | Freq: Once | Status: AC
  Administered 2021-11-20: 1

## 2021-11-20 NOTE — ED Provider Notes (Signed)
[facility] EMERGENCY DEPARTMENT Provider Note   CSN: [id number] Arrival date & time: 11/19/21  2346     History  Chief Complaint  Patient presents with   Cough    Krista [name] is a 36 y.o. female who presents with concern for 3 days of progressive worsening cough, runny nose, chest tightness now with soreness worse with inspiration.  No COVID test at home. Works [place] a [place]  I personally reviewed her medical records.  History anxiety depression, PMDD, chronic fatigue syndrome, asthma.  She is not anticoagulated.  HPI     Home Medications Prior to Admission medications   Medication Sig Start Date End Date Taking? Authorizing Provider  albuterol (VENTOLIN HFA) 108 (90 Base) MCG/ACT inhaler INHALE 2 PUFFS INTO THE LUNGS EVERY 6 HOURS AS NEEDED FOR WHEEZING OR SHORTNESS OF BREATH 11/22/19   Tysinger, Kermit Balo, PA-C  benzonatate (TESSALON) 200 MG capsule Take 1 capsule (200 mg total) by mouth 2 (two) times daily as needed for cough. Patient not taking: No sig reported 10/28/19   Hetty Blend L, NP-C  budesonide-formoterol (SYMBICORT) 160-4.5 MCG/ACT inhaler Inhale 2 puffs into the lungs 2 (two) times daily.  03/22/19   [provider]  buPROPion (WELLBUTRIN XL) 150 MG 24 hr tablet Take 150 mg by mouth 3 (three) times daily.  07/30/17   [provider]  cetirizine (ZYRTEC) 10 MG tablet Take 10 mg by mouth daily as needed for allergies or rhinitis.  06/07/19   [provider]  clindamycin-benzoyl peroxide (BENZACLIN) gel Apply 1 application topically every morning. 02/22/19   [provider]  D3-1000 25 MCG (1000 UT) tablet TAKE 2 TABLETS BY MOUTH TWICE DAILY 08/02/20   Tysinger, Kermit Balo, PA-C  EPINEPHrine 0.3 mg/0.3 mL IJ SOAJ injection Inject 0.3 mg into the muscle as needed for anaphylaxis.  11/04/18   [provider]  escitalopram (LEXAPRO) 20 MG tablet Take 1 tablet by mouth daily. 03/09/20   [provider]  levonorgestrel-ethinyl estradiol (SEASONALE) 0.15-0.03 MG tablet Take 1 tablet by mouth daily. Patient not taking: No sig reported 07/19/19   Anyanwu, Jethro Bastos, MD  mometasone (NASONEX) 50 MCG/ACT nasal spray Place 2 sprays into the nose daily.  12/29/18   [provider]  montelukast (SINGULAIR) 10 MG tablet Take by mouth. 04/19/19   [provider]  Prenatal Multivit-Min-Fe-FA (PRENATAL VITAMINS) 0.8 MG tablet Take 1 tablet by mouth daily. Patient not taking: Reported on 07/05/2020 03/03/18   Gerrit Heck, CNM  tretinoin (RETIN-A) 0.05 % cream Apply 1 application topically at bedtime.  07/09/18   [provider]      Allergies    Shellfish allergy    Review of Systems   Review of Systems  Constitutional:  Positive for activity change, appetite change, chills, fatigue and fever.  HENT:  Positive for congestion and rhinorrhea.   Respiratory:  Positive for cough, chest tightness and shortness of breath.   Gastrointestinal: Negative.   Genitourinary: Negative.   Neurological: Negative.     Physical Exam Updated Vital Signs BP 122/87 (BP Location: Right Arm)   Pulse 95   Temp 99.1 F (37.3 C)   Resp 17   Ht 5\' 1"  (1.549 m)   Wt 94.3 kg   LMP 10/29/2021 (Approximate)   SpO2 98%   BMI 39.30 kg/m  Physical Exam Vitals and nursing note reviewed.  Constitutional:      Appearance: She is obese. She is not  ill-appearing or toxic-appearing.  HENT:     Head: Normocephalic and atraumatic.     Nose: Nose normal.     Mouth/Throat:     Mouth: Mucous membranes are moist.     Pharynx: No oropharyngeal exudate or posterior oropharyngeal erythema.  Eyes:     General:        Right eye: No discharge.        Left eye: No discharge.     Extraocular Movements: Extraocular movements intact.     Conjunctiva/sclera: Conjunctivae normal.     Pupils: Pupils are equal, round, and reactive to light.  Cardiovascular:     Rate and Rhythm: Normal rate and  regular rhythm.     Pulses: Normal pulses.     Heart sounds: Normal heart sounds. No murmur heard. Pulmonary:     Effort: Pulmonary effort is normal. No tachypnea, bradypnea, accessory muscle usage, prolonged expiration or respiratory distress.     Breath sounds: Examination of the right-lower field reveals decreased breath sounds. Examination of the left-lower field reveals decreased breath sounds. Decreased breath sounds present. No wheezing or rales.  Abdominal:     General: There is no distension.     Palpations: Abdomen is soft.     Tenderness: There is no abdominal tenderness.  Musculoskeletal:        General: No deformity.     Cervical back: Neck supple.  Skin:    General: Skin is warm and dry.  Neurological:     Mental Status: She is alert. Mental status is at baseline.  Psychiatric:        Mood and Affect: Mood normal.     ED Results / Procedures / Treatments   Labs (all labs ordered are listed, but only abnormal results are displayed) Labs Reviewed  CBC WITH DIFFERENTIAL/PLATELET - Abnormal; Notable for the following components:      Result Value   Monocytes Absolute 1.2 (*)    Eosinophils Absolute 0.6 (*)    All other components within normal limits  COMPREHENSIVE METABOLIC PANEL - Abnormal; Notable for the following components:   Glucose, Bld 126 (*)    BUN <5 (*)    AST 13 (*)    All other components within normal limits  RESP PANEL BY RT-PCR (FLU A&B, COVID) ARPGX2  I-STAT BETA HCG BLOOD, ED ([facility], [facility], [facility] ONLY)  TROPONIN I (HIGH SENSITIVITY)  TROPONIN I (HIGH SENSITIVITY)    EKG None  Radiology DG Chest 2 View  Result Date: 11/20/2021 CLINICAL DATA:  Chest pain, shortness of breath. EXAM: CHEST - 2 VIEW COMPARISON:  None Available. FINDINGS: The heart size and mediastinal contours are within normal limits. No consolidation, effusion, or pneumothorax. No acute osseous abnormality. IMPRESSION: No active cardiopulmonary disease. Electronically Signed   By:  Brett Fairy M.D.   On: 11/20/2021 00:44    Procedures Procedures    Medications Ordered in ED Medications  albuterol (VENTOLIN HFA) 108 (90 Base) MCG/ACT inhaler 4 puff (has no administration in time range)  AeroChamber Plus Flo-Vu Large MISC 1 each (has no administration in time range)    ED Course/ Medical Decision Making/ A&P                           Medical Decision Making 36 year old female who presents with concern for congestion, cough, chest tightness, and fatigue.   HTN and tachycardia on intake, tachycardia resolved at time of my evaluation.  DDX includes but is not limited to pneumonia, reactive airway disease, COVID-19 or other viral URI, pleural effusion, sepsis.   Amount and/or Complexity of Data Reviewed Labs: ordered.    Details: CBC without leukocytosis or anemia, CMP unremarkable, troponin is normal, patient is not pregnant.  COVID test pending at this time. Radiology: ordered.    Details: Chest x-ray visualized by this provider negative for acute cardiopulmonary disease. ECG/medicine tests:     Details:     EKG as above with sinus tachycardia on intake otherwise unremarkable.   Clinical patient most consistent with acute viral etiology of her symptoms.  Some chest tightness on exam with decreased air movement in lung bases.  Albuterol administered in in the ED.  Patient with albuterol at home as well given history of asthma.  She has good outpatient follow-up with PCP, do recommend she follows up with in the outpatient setting.  May also follow her COVID test results in her MyChart account.  No further work-up warranted in the ED at this time.  Patient not immunocompromise therefore not a candidate for antiviral therapy should she test positive for COVID-19.  No further work-up warranted in the ER at this time.  Clinical concern for emergent underlying etiology or further ED work-up or inpatient management is exceedingly low.  Bich voiced understanding of  her medical evaluation and treatment plan. Each of their questions answered to their expressed satisfaction.  Return precautions were given.  Patient is well-appearing, stable, and was discharged in good condition.  This chart was dictated using voice recognition software, Dragon. Despite the best efforts of this provider to proofread and correct errors, errors may still occur which can change documentation meaning.    Final Clinical Impression(s) / ED Diagnoses Final diagnoses:  None    Rx / DC Orders ED Discharge Orders     None         Sherrilee Gilles 11/20/21 0444    Tilden Fossa, MD 11/20/21 [id number]

## 2021-11-20 NOTE — Discharge Instructions (Signed)
You are seen in the ER today for your congestion and cough.  Your blood work was reassuring as was your chest x-ray.  You likely have a viral illness causing your symptoms.  You have been tested for COVID-19 and may follow your test results in the MyChart account.  You may use over-the-counter medication such as Tylenol or ibuprofen as well as over-the-counter decongestants to treat your symptoms.  May also use your albuterol at home for chest tightness.  Please follow-up closely outpatient setting with your primary care doctor and return to the ER with any severe symptoms.

## 2021-11-20 NOTE — ED Triage Notes (Signed)
Pt c/o cough, runny nose, chest tightness worse with inhalation, SOB, has not have a covid test

## 2021-11-20 NOTE — ED Provider Triage Note (Signed)
Emergency Medicine Provider Triage Evaluation Note  Krista [name] , a 36 y.o. female  was evaluated in triage.  Pt complains of runny nose, cough, chest tightness and congestion with chest pain centrally worsened with inspiration as well as shortness of breath x3 days.  Nausea but no vomiting..  Review of Systems  Positive: As above Negative: Syncope vomiting diarrhea  Physical Exam  BP (!) 144/95 (BP Location: Right Arm)   Pulse (!) 111   Temp 99.3 F (37.4 C) (Oral)   Resp 18   Ht 5\' 1"  (1.549 m)   Wt 94.3 kg   LMP 10/29/2021 (Approximate)   SpO2 93%   BMI 39.30 kg/m  Gen:   Awake, no distress   Resp:  Normal effort  MSK:   Moves extremities without difficulty  Other:  Tachycardic with regular rhythm, no M/R/G.  Lungs with some rhonchi in the bases but otherwise unremarkable.  Nasal congestion.  Patient uncomfortable appearing but not toxic appearing.  Medical Decision Making  Medically screening exam initiated at 12:15 AM.  Appropriate orders placed.  Krista [name] was informed that the remainder of the evaluation will be completed by another provider, this initial triage assessment does not replace that evaluation, and the importance of remaining in the ED until their evaluation is complete.  This chart was dictated using voice recognition software, Dragon. Despite the best efforts of this provider to proofread and correct errors, errors may still occur which can change documentation meaning.    Emeline Darling, PA-C 11/20/21 0023

## 2021-11-30 DIAGNOSIS — Z713 Dietary counseling and surveillance: Secondary | ICD-10-CM | POA: Diagnosis not present

## 2021-12-03 ENCOUNTER — Ambulatory Visit: Payer: Federal, State, Local not specified - PPO | Admitting: Psychology

## 2021-12-17 ENCOUNTER — Ambulatory Visit: Payer: Federal, State, Local not specified - PPO | Admitting: Psychology

## 2021-12-31 ENCOUNTER — Ambulatory Visit: Payer: Federal, State, Local not specified - PPO | Admitting: Psychology

## 2022-01-03 DIAGNOSIS — F411 Generalized anxiety disorder: Secondary | ICD-10-CM | POA: Diagnosis not present

## 2022-01-03 DIAGNOSIS — F900 Attention-deficit hyperactivity disorder, predominantly inattentive type: Secondary | ICD-10-CM | POA: Diagnosis not present

## 2022-01-03 DIAGNOSIS — F339 Major depressive disorder, recurrent, unspecified: Secondary | ICD-10-CM | POA: Diagnosis not present

## 2022-01-03 DIAGNOSIS — E559 Vitamin D deficiency, unspecified: Secondary | ICD-10-CM | POA: Diagnosis not present

## 2022-01-14 ENCOUNTER — Ambulatory Visit: Payer: Federal, State, Local not specified - PPO | Admitting: Psychology

## 2022-01-28 ENCOUNTER — Ambulatory Visit: Payer: Federal, State, Local not specified - PPO | Admitting: Psychology

## 2022-02-02 ENCOUNTER — Inpatient Hospital Stay (HOSPITAL_COMMUNITY): Payer: Federal, State, Local not specified - PPO

## 2022-02-02 ENCOUNTER — Inpatient Hospital Stay (HOSPITAL_COMMUNITY)
Admission: EM | Admit: 2022-02-02 | Discharge: 2022-02-02 | Disposition: A | Discharge status: Home / Self Care | Payer: Federal, State, Local not specified - PPO | Attending: Obstetrics & Gynecology | Admitting: Obstetrics & Gynecology

## 2022-02-02 ENCOUNTER — Other Ambulatory Visit: Payer: Self-pay

## 2022-02-02 ENCOUNTER — Encounter (HOSPITAL_COMMUNITY): Payer: Self-pay | Admitting: Emergency Medicine

## 2022-02-02 DIAGNOSIS — O09521 Supervision of elderly multigravida, first trimester: Secondary | ICD-10-CM | POA: Diagnosis not present

## 2022-02-02 DIAGNOSIS — O26891 Other specified pregnancy related conditions, first trimester: Secondary | ICD-10-CM | POA: Insufficient documentation

## 2022-02-02 DIAGNOSIS — O26899 Other specified pregnancy related conditions, unspecified trimester: Secondary | ICD-10-CM | POA: Diagnosis not present

## 2022-02-02 DIAGNOSIS — R102 Other specified pregnancy related conditions, unspecified trimester: Secondary | ICD-10-CM

## 2022-02-02 DIAGNOSIS — Z3A01 Less than 8 weeks gestation of pregnancy: Secondary | ICD-10-CM | POA: Insufficient documentation

## 2022-02-02 DIAGNOSIS — Z3201 Encounter for pregnancy test, result positive: Secondary | ICD-10-CM | POA: Diagnosis not present

## 2022-02-02 DIAGNOSIS — O3680X0 Pregnancy with inconclusive fetal viability, not applicable or unspecified: Secondary | ICD-10-CM

## 2022-02-02 LAB — WET PREP, GENITAL
Clue Cells Wet Prep HPF POC: NONE SEEN
Sperm: NONE SEEN
Trich, Wet Prep: NONE SEEN
WBC, Wet Prep HPF POC: <10 (ref <10)
Yeast Wet Prep HPF POC: NONE SEEN

## 2022-02-02 LAB — CBC
HCT: 37.5 % (ref 36.0–46.0)
Hemoglobin: 12.7 g/dL (ref 12.0–15.0)
MCH: 31.8 pg (ref 26.0–34.0)
MCHC: 33.9 g/dL (ref 30.0–36.0)
MCV: 93.8 fL (ref 80.0–100.0)
Platelets: 283 10*3/uL (ref 150–400)
RBC: 4 MIL/uL (ref 3.87–5.11)
RDW: 12.7 % (ref 11.5–15.5)
WBC: 11.6 10*3/uL — ABNORMAL HIGH (ref 4.0–10.5)
nRBC: 0 % (ref 0.0–0.2)

## 2022-02-02 LAB — HCG, QUANTITATIVE, PREGNANCY: hCG, Beta Chain, Quant, S: 466 m[IU]/mL — ABNORMAL HIGH (ref <5)

## 2022-02-02 NOTE — ED Triage Notes (Signed)
Pt sent from urgent care for intermittent R sided abdominal pain that radiates into pelvic region that started approx a week ago. Pt received positive pregnancy test at urgent care. Denies n/v/d.

## 2022-02-02 NOTE — ED Provider Triage Note (Signed)
Emergency Medicine Provider OB Triage Evaluation Note  Krista [name] is a 36 y.o. female, [phone], at Unknown gestation who presents to the emergency department with complaints of right-sided pelvic pain went to urgent care today, had positive pregnancy test, sent to ER for further work up.  Patient is G6, P2. LMP 1 month ago, irregular. Spotting a few days ago, no bleeding currently.   Review of  Systems  Positive: as above Negative: as above  Physical Exam  BP (!) 142/83   Pulse 100   Temp 98.1 F (36.7 C) (Oral)   Resp 16   Ht 5\' 4"  (1.626 m)   Wt 98 kg   SpO2 95%   BMI 37.08 kg/m  General: Awake, no distress  HEENT: Atraumatic  Resp: Normal effort  Cardiac: Normal rate Abd: Nondistended, nontender  MSK: Moves all extremities without difficulty Neuro: Speech clear  Medical Decision Making  Pt evaluated for pregnancy concern and is stable for transfer to [facility]U. Pt is in agreement with plan for transfer.  6:08 PM Discussed with MAU APP, , who accepts patient in transfer.  Clinical Impression   1. Pelvic pain in pregnant patient at less than [redacted] weeks gestation        [name] 02/02/22 14/09/23

## 2022-02-02 NOTE — MAU Note (Signed)
Krista [name] is a 36 y.o. at [redacted]w[redacted]d here in MAU reporting: since the beginning of this week has been having right sided pelvic pain that radiates up the right side of her abdomen. Had spotting a couple of times but none today. No discharge.  LMP: 12/31/21, irregular periods  Onset of complaint: ongoing for a few days  Pain score: 5/10  Vitals:   02/02/22 1800 02/02/22 1843  BP: (!) 142/83 128/81  Pulse: 100 90  Resp: 16 16  Temp: 98.1 F (36.7 C) 98 F (36.7 C)  SpO2: 95% 99%     FHT:NA  Lab orders placed from triage: none

## 2022-02-02 NOTE — MAU Provider Note (Addendum)
History     CSN: [id number]  Arrival date and time: 02/02/22 1753   Event Date/Time   First Provider Initiated Contact with Patient 02/02/22 1923      Chief Complaint  Patient presents with   Abdominal Pain   HPI Krista [name] is a 36 y.o. [id number] at [redacted]w[redacted]d by LMP who presents to MAU for right sided pelvic pain that radiates in to right side of abdomen that started earlier this week. Pain is intermittent and she describes it as a squeezing sensation. She reports she had some spotting earlier however does not have any tonight. She denies itching, odor, or urinary s/s. This is a planned pregnancy. LMP was 11/6. She does not have an OBGYN.   OB History     Gravida  6   Para  2   Term  2   Preterm      AB  3   Living  2      SAB  2   IAB  1   Ectopic      Multiple      Live Births  2           Past Medical History:  Diagnosis Date   Anemia    History of   Anxiety    Asthma    Cyst of right kidney    Depression     Past Surgical History:  Procedure Laterality Date   DILATION AND EVACUATION N/A 02/21/2016   Procedure: DILATATION AND EVACUATION;  Surgeon: Levie Heritage, DO;  Location: [facility];  Service: Gynecology;  Laterality: N/A;   IUD REMOVAL  11/2016   NO PAST SURGERIES     ROBOTIC ASSITED PARTIAL NEPHRECTOMY Right 02/07/2017   Procedure: XI ROBOTIC ASSITED PARTIAL NEPHRECTOMY;  Surgeon: Sebastian Ache, MD;  Location: [facility];  Service: Urology;  Laterality: Right;    Family History  Problem Relation Age of Onset   Asthma Sister    Arthritis Mother        OA?, not specifically RA   COPD Mother    Arthritis Maternal Grandmother    Heart disease Neg Hx    Cancer Neg Hx    Stroke Neg Hx     Social History   Tobacco Use   Smoking status: Never   Smokeless tobacco: Never  Vaping Use   Vaping Use: Never used  Substance Use Topics   Alcohol use: Yes    Comment: socially    Drug use: No    Allergies:  Allergies  Allergen Reactions    Shellfish Allergy Itching    Tongue itch and gets numb     Medications Prior to Admission  Medication Sig Dispense Refill Last Dose   albuterol (VENTOLIN HFA) 108 (90 Base) MCG/ACT inhaler INHALE 2 PUFFS INTO THE LUNGS EVERY 6 HOURS AS NEEDED FOR WHEEZING OR SHORTNESS OF BREATH 6.7 g 0    benzonatate (TESSALON) 200 MG capsule Take 1 capsule (200 mg total) by mouth 2 (two) times daily as needed for cough. (Patient not taking: No sig reported) 20 capsule 0    budesonide-formoterol (SYMBICORT) 160-4.5 MCG/ACT inhaler Inhale 2 puffs into the lungs 2 (two) times daily.       buPROPion (WELLBUTRIN XL) 150 MG 24 hr tablet Take 150 mg by mouth 3 (three) times daily.   0    cetirizine (ZYRTEC) 10 MG tablet Take 10 mg by mouth daily as needed for allergies or rhinitis.       clindamycin-benzoyl  peroxide (BENZACLIN) gel Apply 1 application topically every morning.      D3-1000 25 MCG (1000 UT) tablet TAKE 2 TABLETS BY MOUTH TWICE DAILY 180 tablet 0    EPINEPHrine 0.3 mg/0.3 mL IJ SOAJ injection Inject 0.3 mg into the muscle as needed for anaphylaxis.       escitalopram (LEXAPRO) 20 MG tablet Take 1 tablet by mouth daily.      levonorgestrel-ethinyl estradiol (SEASONALE) 0.15-0.03 MG tablet Take 1 tablet by mouth daily. (Patient not taking: No sig reported) 1 Package 4    mometasone (NASONEX) 50 MCG/ACT nasal spray Place 2 sprays into the nose daily.       montelukast (SINGULAIR) 10 MG tablet Take by mouth.      Prenatal Multivit-Min-Fe-FA (PRENATAL VITAMINS) 0.8 MG tablet Take 1 tablet by mouth daily. (Patient not taking: Reported on 07/05/2020) 90 tablet 3    tretinoin (RETIN-A) 0.05 % cream Apply 1 application topically at bedtime.       Review of Systems  Constitutional: Negative.   Gastrointestinal:  Positive for abdominal pain.  Genitourinary:  Positive for pelvic pain.  All other systems reviewed and are negative.  Physical Exam   Blood pressure 128/81, pulse 90, temperature 98 F (36.7  C), temperature source Oral, resp. rate 16, height 5\' 4"  (1.626 m), weight 98.4 kg, last menstrual period 12/31/2021, SpO2 99 %, unknown if currently breastfeeding.  Physical Exam Vitals and nursing note reviewed.  Constitutional:      General: She is not in acute distress. Pulmonary:     Effort: Pulmonary effort is normal.  Abdominal:     Palpations: Abdomen is soft.     Tenderness: There is no abdominal tenderness.  Genitourinary:    Comments: Blind swabs collected by patient Neurological:     General: No focal deficit present.     Mental Status: She is alert and oriented to person, place, and time.  Psychiatric:        Mood and Affect: Mood normal.        Behavior: Behavior normal.    Results for orders placed or performed during the hospital encounter of 02/02/22 (from the past 24 hour(s))  Wet prep, genital     Status: None   Collection Time: 02/02/22  7:35 PM   Specimen: Vaginal  Result Value Ref Range   Yeast Wet Prep HPF POC NONE SEEN NONE SEEN   Trich, Wet Prep NONE SEEN NONE SEEN   Clue Cells Wet Prep HPF POC NONE SEEN NONE SEEN   WBC, Wet Prep HPF POC <10 <10   Sperm NONE SEEN   CBC     Status: Abnormal   Collection Time: 02/02/22  7:47 PM  Result Value Ref Range   WBC 11.6 (H) 4.0 - 10.5 K/uL   RBC 4.00 3.87 - 5.11 MIL/uL   Hemoglobin 12.7 12.0 - 15.0 g/dL   HCT 14/09/23 86.7 - 61.9 %   MCV 93.8 80.0 - 100.0 fL   MCH 31.8 26.0 - 34.0 pg   MCHC 33.9 30.0 - 36.0 g/dL   RDW 50.9 32.6 - 71.2 %   Platelets 283 150 - 400 K/uL   nRBC 0.0 0.0 - 0.2 %  hCG, quantitative, pregnancy     Status: Abnormal   Collection Time: 02/02/22  7:47 PM  Result Value Ref Range   hCG, Beta Chain, Quant, S 466 (H) <5 mIU/mL    14/09/23 OB LESS THAN 14 WEEKS WITH OB TRANSVAGINAL  Result Date: 02/02/2022 CLINICAL  DATA:  Abdominal pain affecting pregnancy. EGA [redacted] weeks 5 days by LMP EXAM: ULTRASOUND OF PELVIS TECHNIQUE: Transabdominal and transvaginalultrasound examination of the pelvis was  performed including evaluation of the uterus, ovaries, adnexal regions, and pelvic cul-de-sac. COMPARISON:  05/14/2019 FINDINGS: Uterusretroverted measuring 8 x 6 x 6 cm. The endometrium 1.5 cm. The uterine cavity is empty. There are no uterine masses. Right ovary Unremarkable, 3.3 x 3.1 x 2.3 cm. Left ovary Unremarkable, 3.0 x 2.2 x 1.9 cm. Images of the adnexae demonstrated no masses or fluid collections. IMPRESSION: 1. No evidence of intrauterine or ectopic pregnancy. 2. Endometrial changes may be due to early trophoblastic response. 3. Correlation with quantitative HCG measurements recommended and repeat imaging if indicated to determine viability. Electronically Signed   By: Layla Maw M.D.   On: 02/02/2022 20:29    MAU Course  Procedures  MDM CBC, HCG, ABO/RH Wet prep, GC/CT Ultrasound  CBC and wet prep unremarkable. Blood type is B positive, Rhogam not indicated Ultrasound does not show evidence of IUP or ectopic pregnancy  HCG pending. Care handed off to S. Reita Cliche, CNM at 2045  Brand Males, CNM 02/02/22 8:51 PM  Assessment and Plan  --36 y.o. [id number] with PUL --Quant hCG 466 --Discharge home in stable condition with ectopic and bleeding precautions  F/U: --Appointment made for repeat stat Quant hCG Tuesday morning at [facility], MSA, MSN, CNM Certified Nurse Midwife, Chiropractor

## 2022-02-04 LAB — GC/CHLAMYDIA PROBE AMP (~~LOC~~) NOT AT ARMC
Chlamydia: NEGATIVE
Comment: NEGATIVE
Comment: NORMAL
Neisseria Gonorrhea: NEGATIVE

## 2022-02-05 ENCOUNTER — Ambulatory Visit (INDEPENDENT_AMBULATORY_CARE_PROVIDER_SITE_OTHER): Payer: Federal, State, Local not specified - PPO

## 2022-02-05 DIAGNOSIS — R102 Pelvic and perineal pain: Secondary | ICD-10-CM | POA: Diagnosis not present

## 2022-02-05 DIAGNOSIS — O26899 Other specified pregnancy related conditions, unspecified trimester: Secondary | ICD-10-CM

## 2022-02-05 DIAGNOSIS — Z3A00 Weeks of gestation of pregnancy not specified: Secondary | ICD-10-CM

## 2022-02-05 DIAGNOSIS — O3680X0 Pregnancy with inconclusive fetal viability, not applicable or unspecified: Secondary | ICD-10-CM

## 2022-02-05 LAB — BETA HCG QUANT (REF LAB): hCG Quant: 626 m[IU]/mL

## 2022-02-05 NOTE — Progress Notes (Signed)
Beta HCG Follow-up Visit  Krista [name] presents to [facility] for follow-up beta HCG lab. She was seen in MAU for abdominal pain on 02/02/2022. Patient denies pain and bleeding today. Discussed with patient that we are following beta HCG levels today. Results will be back in approximately 2 hours. Valid contact number for patient confirmed. I will call the patient with results.   Beta HCG results: 02/02/2022 466  02/05/2022 626      Results and patient history reviewed with Birder Robson MD, who states pt needs STAT beta HCG in 2 days. Patient called and informed of plan for follow-up. Pt follow up appointment made for 12/14. Pt aware.   Krista [name] 02/05/2022 8:47 AM

## 2022-02-07 ENCOUNTER — Other Ambulatory Visit: Payer: Self-pay

## 2022-02-07 ENCOUNTER — Ambulatory Visit (INDEPENDENT_AMBULATORY_CARE_PROVIDER_SITE_OTHER): Payer: Federal, State, Local not specified - PPO

## 2022-02-07 VITALS — BP 127/67 | HR 121 | Ht 64.0 in | Wt 219.1 lb

## 2022-02-07 DIAGNOSIS — O3680X0 Pregnancy with inconclusive fetal viability, not applicable or unspecified: Secondary | ICD-10-CM

## 2022-02-07 DIAGNOSIS — Z3A00 Weeks of gestation of pregnancy not specified: Secondary | ICD-10-CM

## 2022-02-07 LAB — BETA HCG QUANT (REF LAB): hCG Quant: 807 m[IU]/mL

## 2022-02-07 NOTE — Progress Notes (Signed)
Pt here today for STAT Beta s/p MAU visit on 02/02/22 with beta 466.  Pt had f/u stat beta on 02/05/22 resulting 626.  Pt states that she is here so that she can get her blood drawn to make sure that her blood increases as it should.   Pt denies vaginal bleeding and pain.  Pt advised that I will call her by the end of the day with results and f/u.  Pt verbalized understanding with no further questions.     Received STAT beta result of 807.  Reviewed chart with Dr. Donavan Foil who recommends that it is not an appropriate rise.  Pt can be offered another 48 stat beta in MAU.   Pt notified of  providers recommendation.  Pt informed about the possible discussion of MTX if her levels continue to not rise appropriately.  Pt verbalized understanding with no further questions.  MAU RN notified of pts arrival on Saturday.      Thank you  Leonette Nutting

## 2022-02-08 DIAGNOSIS — Z713 Dietary counseling and surveillance: Secondary | ICD-10-CM | POA: Diagnosis not present

## 2022-02-11 ENCOUNTER — Ambulatory Visit: Payer: Federal, State, Local not specified - PPO | Admitting: Psychology

## 2022-02-13 ENCOUNTER — Encounter (HOSPITAL_COMMUNITY): Payer: Self-pay | Admitting: Obstetrics & Gynecology

## 2022-02-13 ENCOUNTER — Inpatient Hospital Stay (HOSPITAL_COMMUNITY)
Admission: AD | Admit: 2022-02-13 | Discharge: 2022-02-13 | Disposition: A | Discharge status: Home / Self Care | Payer: Federal, State, Local not specified - PPO | Attending: Obstetrics & Gynecology | Admitting: Obstetrics & Gynecology

## 2022-02-13 ENCOUNTER — Inpatient Hospital Stay (HOSPITAL_COMMUNITY): Payer: Federal, State, Local not specified - PPO

## 2022-02-13 ENCOUNTER — Other Ambulatory Visit: Payer: Self-pay

## 2022-02-13 DIAGNOSIS — O26899 Other specified pregnancy related conditions, unspecified trimester: Secondary | ICD-10-CM

## 2022-02-13 DIAGNOSIS — R109 Unspecified abdominal pain: Secondary | ICD-10-CM

## 2022-02-13 DIAGNOSIS — Z1152 Encounter for screening for COVID-19: Secondary | ICD-10-CM | POA: Diagnosis not present

## 2022-02-13 DIAGNOSIS — Z3A01 Less than 8 weeks gestation of pregnancy: Secondary | ICD-10-CM

## 2022-02-13 DIAGNOSIS — R0789 Other chest pain: Secondary | ICD-10-CM | POA: Diagnosis not present

## 2022-02-13 DIAGNOSIS — O26891 Other specified pregnancy related conditions, first trimester: Secondary | ICD-10-CM | POA: Insufficient documentation

## 2022-02-13 DIAGNOSIS — O99511 Diseases of the respiratory system complicating pregnancy, first trimester: Secondary | ICD-10-CM | POA: Diagnosis not present

## 2022-02-13 DIAGNOSIS — O09521 Supervision of elderly multigravida, first trimester: Secondary | ICD-10-CM | POA: Insufficient documentation

## 2022-02-13 DIAGNOSIS — J101 Influenza due to other identified influenza virus with other respiratory manifestations: Secondary | ICD-10-CM | POA: Diagnosis not present

## 2022-02-13 LAB — COMPREHENSIVE METABOLIC PANEL
ALT: 17 U/L (ref 0–44)
AST: 23 U/L (ref 15–41)
Albumin: 3.5 g/dL (ref 3.5–5.0)
Alkaline Phosphatase: 65 U/L (ref 38–126)
Anion gap: 7 (ref 5–15)
BUN: 5 mg/dL — ABNORMAL LOW (ref 6–20)
CO2: 24 mmol/L (ref 22–32)
Calcium: 9 mg/dL (ref 8.9–10.3)
Chloride: 105 mmol/L (ref 98–111)
Creatinine, Ser: 0.92 mg/dL (ref 0.44–1.00)
GFR, Estimated: >60 mL/min (ref >60)
Glucose, Bld: 208 mg/dL — ABNORMAL HIGH (ref 70–99)
Potassium: 3.4 mmol/L — ABNORMAL LOW (ref 3.5–5.1)
Sodium: 136 mmol/L (ref 135–145)
Total Bilirubin: 0.3 mg/dL (ref 0.3–1.2)
Total Protein: 6.7 g/dL (ref 6.5–8.1)

## 2022-02-13 LAB — URINALYSIS, ROUTINE W REFLEX MICROSCOPIC
Bacteria, UA: NONE SEEN
Bilirubin Urine: NEGATIVE
Glucose, UA: >=500 mg/dL — AB
Hgb urine dipstick: NEGATIVE
Ketones, ur: NEGATIVE mg/dL
Leukocytes,Ua: NEGATIVE
Nitrite: NEGATIVE
Protein, ur: NEGATIVE mg/dL
Specific Gravity, Urine: 1.015 (ref 1.005–1.030)
pH: 5 (ref 5.0–8.0)

## 2022-02-13 LAB — RESP PANEL BY RT-PCR (RSV, FLU A&B, COVID)  RVPGX2
Influenza A by PCR: POSITIVE — AB
Influenza B by PCR: NEGATIVE
Resp Syncytial Virus by PCR: NEGATIVE
SARS Coronavirus 2 by RT PCR: NEGATIVE

## 2022-02-13 LAB — CBC
HCT: 35.5 % — ABNORMAL LOW (ref 36.0–46.0)
Hemoglobin: 11.9 g/dL — ABNORMAL LOW (ref 12.0–15.0)
MCH: 31.7 pg (ref 26.0–34.0)
MCHC: 33.5 g/dL (ref 30.0–36.0)
MCV: 94.7 fL (ref 80.0–100.0)
Platelets: 240 10*3/uL (ref 150–400)
RBC: 3.75 MIL/uL — ABNORMAL LOW (ref 3.87–5.11)
RDW: 13.2 % (ref 11.5–15.5)
WBC: 7.2 10*3/uL (ref 4.0–10.5)
nRBC: 0 % (ref 0.0–0.2)

## 2022-02-13 LAB — HCG, QUANTITATIVE, PREGNANCY: hCG, Beta Chain, Quant, S: 976 m[IU]/mL — ABNORMAL HIGH (ref <5)

## 2022-02-13 MED ORDER — ACETAMINOPHEN 500 MG PO TABS
1000.0000 mg | ORAL_TABLET | Freq: Once | ORAL | Status: AC
  Administered 2022-02-13: 1000 mg via ORAL
  Filled 2022-02-13: qty 2

## 2022-02-13 NOTE — Discharge Instructions (Signed)

## 2022-02-13 NOTE — MAU Note (Signed)
Krista [name] is a 36 y.o. at [redacted]w[redacted]d here in MAU reporting: last night started spotting and cramping. Spotting has stopped but is still cramping. Reports she is also having chest pain, back pain, and body aches. Has been seen for quants but reports did not keep last appointment because she had to work.   Onset of complaint: ongoing  Pain score: cramping 5/10, chest 7/10, body aches 10/10  Vitals:   02/13/22 1442  BP: (!) 120/49  Pulse: (!) 124  Resp: (!) 22  Temp: 99.1 F (37.3 C)  SpO2: 100%     FHT:NA  Lab orders placed from triage: UA

## 2022-02-13 NOTE — MAU Provider Note (Signed)
History     CSN: [id number]  Arrival date and time: 02/13/22 1423   None     Chief Complaint  Patient presents with   Abdominal Pain   Chest Pain   HPI Krista [name]. Patin is a 36 y.o. [id number] at [redacted]w[redacted]d by LMP who presents to MAU for multiple complaints.  Spotting/Cramping Patient reports spotting and cramping started last night. She reports spotting stopped this morning however the cramping has continued. Pain is in pelvis and is intermittent. Patient has been followed in the office for inappropriate rising HCG levels. She was suppose to have one on 12/16 however missed it due to work.   Chest/back pain She reports chest and back pain started this morning. She describes the pain as a tightness in her chest that radiates into her back. She reports this is what she typically feels when she gets sick. She reports a cough with phlegm as well as generalized body aches. She denies fever, chills, or SOB. She reports she has been exposed to several sick co-workers, one recently diagnosed with the flu. She took Tylenol around 4am and Aleve at 11am which did help her symptoms and she was able to sleep.    OB History     Gravida  6   Para  2   Term  2   Preterm      AB  3   Living  2      SAB  2   IAB  1   Ectopic      Multiple      Live Births  2           Past Medical History:  Diagnosis Date   Anemia    History of   Anxiety    Asthma    Cyst of right kidney    Depression     Past Surgical History:  Procedure Laterality Date   DILATION AND EVACUATION N/A 02/21/2016   Procedure: DILATATION AND EVACUATION;  Surgeon: Levie Heritage, DO;  Location: [facility];  Service: Gynecology;  Laterality: N/A;   IUD REMOVAL  11/2016   NO PAST SURGERIES     ROBOTIC ASSITED PARTIAL NEPHRECTOMY Right 02/07/2017   Procedure: XI ROBOTIC ASSITED PARTIAL NEPHRECTOMY;  Surgeon: Sebastian Ache, MD;  Location: [facility];  Service: Urology;  Laterality: Right;    Family History   Problem Relation Age of Onset   Asthma Sister    Arthritis Mother        OA?, not specifically RA   COPD Mother    Arthritis Maternal Grandmother    Heart disease Neg Hx    Cancer Neg Hx    Stroke Neg Hx     Social History   Tobacco Use   Smoking status: Never   Smokeless tobacco: Never  Vaping Use   Vaping Use: Never used  Substance Use Topics   Alcohol use: Yes    Comment: socially    Drug use: No    Allergies:  Allergies  Allergen Reactions   Shellfish Allergy Itching    Tongue itch and gets numb     No medications prior to admission.   Review of Systems  Constitutional: Negative.   HENT:  Negative for congestion and sore throat.   Respiratory:  Positive for cough and chest tightness.   Cardiovascular: Negative.   Gastrointestinal:  Positive for abdominal pain (cramping).  Genitourinary:  Positive for vaginal bleeding (spotting last night). Negative for vaginal discharge.  Musculoskeletal:  Positive  for myalgias.  Neurological: Negative.    Physical Exam  Patient Vitals for the past 24 hrs:  BP Temp Temp src Pulse Resp SpO2 Height Weight  02/13/22 1722 119/65 -- -- (!) 122 -- -- -- --  02/13/22 1720 -- -- -- -- -- 99 % -- --  02/13/22 1715 -- -- -- -- -- 98 % -- --  02/13/22 1710 -- -- -- -- -- 99 % -- --  02/13/22 1705 -- -- -- -- -- 99 % -- --  02/13/22 1654 -- -- -- (!) 122 -- -- -- --  02/13/22 1635 -- -- -- -- -- 99 % -- --  02/13/22 1630 -- -- -- (!) 120 -- 99 % -- --  02/13/22 1625 -- -- -- -- -- 99 % -- --  02/13/22 1623 -- -- -- (!) 122 19 100 % -- --  02/13/22 1620 -- -- -- -- -- 98 % -- --  02/13/22 1442 (!) 120/49 99.1 F (37.3 C) Oral (!) 124 (!) 22 100 % -- --  02/13/22 1437 -- -- -- -- -- -- 5\' 4"  (1.626 m) 99.4 kg   Physical Exam Vitals and nursing note reviewed.  Constitutional:      General: She is not in acute distress.    Appearance: She is obese.  Eyes:     Extraocular Movements: Extraocular movements intact.     Pupils:  Pupils are equal, round, and reactive to light.  Cardiovascular:     Rate and Rhythm: Regular rhythm. Tachycardia present.     Heart sounds: Normal heart sounds.  Pulmonary:     Effort: Pulmonary effort is normal. No respiratory distress.     Breath sounds: Normal breath sounds. No stridor. No decreased breath sounds, wheezing or rales.  Abdominal:     Palpations: Abdomen is soft.     Tenderness: There is no abdominal tenderness.  Musculoskeletal:     Cervical back: Normal range of motion.  Skin:    General: Skin is warm and dry.  Neurological:     General: No focal deficit present.     Mental Status: She is alert and oriented to person, place, and time.  Psychiatric:        Mood and Affect: Mood normal.        Behavior: Behavior normal.    Results for orders placed or performed during the hospital encounter of 02/13/22 (from the past 24 hour(s))  Resp panel by RT-PCR (RSV, Flu A&B, Covid) Anterior Nasal Swab     Status: Abnormal   Collection Time: 02/13/22  2:48 PM   Specimen: Anterior Nasal Swab  Result Value Ref Range   SARS Coronavirus 2 by RT PCR NEGATIVE NEGATIVE   Influenza A by PCR POSITIVE (A) NEGATIVE   Influenza B by PCR NEGATIVE NEGATIVE   Resp Syncytial Virus by PCR NEGATIVE NEGATIVE  CBC     Status: Abnormal   Collection Time: 02/13/22  2:51 PM  Result Value Ref Range   WBC 7.2 4.0 - 10.5 K/uL   RBC 3.75 (L) 3.87 - 5.11 MIL/uL   Hemoglobin 11.9 (L) 12.0 - 15.0 g/dL   HCT 02/15/22 (L) 56.3 - 87.5 %   MCV 94.7 80.0 - 100.0 fL   MCH 31.7 26.0 - 34.0 pg   MCHC 33.5 30.0 - 36.0 g/dL   RDW 64.3 32.9 - 51.8 %   Platelets 240 150 - 400 K/uL   nRBC 0.0 0.0 - 0.2 %  hCG, quantitative, pregnancy  Status: Abnormal   Collection Time: 02/13/22  2:51 PM  Result Value Ref Range   hCG, Beta Chain, Quant, S 976 (H) <5 mIU/mL  Comprehensive metabolic panel     Status: Abnormal   Collection Time: 02/13/22  2:51 PM  Result Value Ref Range   Sodium 136 135 - 145 mmol/L    Potassium 3.4 (L) 3.5 - 5.1 mmol/L   Chloride 105 98 - 111 mmol/L   CO2 24 22 - 32 mmol/L   Glucose, Bld 208 (H) 70 - 99 mg/dL   BUN 5 (L) 6 - 20 mg/dL   Creatinine, Ser 6.83 0.44 - 1.00 mg/dL   Calcium 9.0 8.9 - 41.9 mg/dL   Total Protein 6.7 6.5 - 8.1 g/dL   Albumin 3.5 3.5 - 5.0 g/dL   AST 23 15 - 41 U/L   ALT 17 0 - 44 U/L   Alkaline Phosphatase 65 38 - 126 U/L   Total Bilirubin 0.3 0.3 - 1.2 mg/dL   GFR, Estimated >62 >22 mL/min   Anion gap 7 5 - 15  Urinalysis, Routine w reflex microscopic Urine, Clean Catch     Status: Abnormal   Collection Time: 02/13/22  3:02 PM  Result Value Ref Range   Color, Urine YELLOW YELLOW   APPearance CLEAR CLEAR   Specific Gravity, Urine 1.015 1.005 - 1.030   pH 5.0 5.0 - 8.0   Glucose, UA >=500 (A) NEGATIVE mg/dL   Hgb urine dipstick NEGATIVE NEGATIVE   Bilirubin Urine NEGATIVE NEGATIVE   Ketones, ur NEGATIVE NEGATIVE mg/dL   Protein, ur NEGATIVE NEGATIVE mg/dL   Nitrite NEGATIVE NEGATIVE   Leukocytes,Ua NEGATIVE NEGATIVE   RBC / HPF 0-5 0 - 5 RBC/hpf   WBC, UA 0-5 0 - 5 WBC/hpf   Bacteria, UA NONE SEEN NONE SEEN   Squamous Epithelial / LPF 0-5 0 - 5   Mucus PRESENT     US OB LESS THAN 14 WEEKS WITH OB TRANSVAGINAL  Result Date: 02/13/2022 CLINICAL DATA:  Abdominal pain affecting pregnancy, LMP 12/31/2021, quantitative beta hCG 976 today EXAM: OBSTETRIC <14 WK [place] AND TRANSVAGINAL OB US TECHNIQUE: Both transabdominal and transvaginal ultrasound examinations were performed for complete evaluation of the gestation as well as the maternal uterus, adnexal regions, and pelvic cul-de-sac. Transvaginal technique was performed to assess early pregnancy. COMPARISON:  None Available. FINDINGS: Intrauterine gestational sac: Present, single Yolk sac:  Not identified Embryo:  Not identified Cardiac Activity: N/A Heart Rate: N/A  bpm MSD: 4.2 mm   5 w   1 d CRL:    mm    w    d                  [place] EDC: Subchorionic hemorrhage:  None visualized. Maternal  uterus/adnexae: Uterus retroverted, otherwise normal appearance. Small physiologic corpus luteum RIGHT ovary, ovaries otherwise unremarkable. No adnexal masses. Trace nonspecific free pelvic fluid. IMPRESSION: Tiny gestational sac within the uterus, mean sac diameter corresponding to 5 weeks 1 day EGA. No yolk sac or fetal pole identified to establish viability; may consider follow-up ultrasound in 14 days to establish viability if clinically indicated. No acute abnormalities. Electronically Signed   By: Ulyses Southward M.D.   On: 02/13/2022 17:07   DG Chest 2 View  Result Date: 02/13/2022 CLINICAL DATA:  Chest tightness, pain in middle of chest EXAM: CHEST - 2 VIEW COMPARISON:  11/20/2021 FINDINGS: Normal heart size, mediastinal contours, and pulmonary vascularity. Lungs clear. No pulmonary infiltrate, pleural  effusion, or pneumothorax. Osseous structures unremarkable. IMPRESSION: Normal exam. Electronically Signed   By: Ulyses SouthwardMark  Boles M.D.   On: 02/13/2022 16:11     MAU Course  Procedures  MDM UA, CBC, CMP, HCG, Flu/Covid/RSV swab EKG US Chest x-ray Tylenol PO  02/02/2022: 466 02/05/2022: 626 02/07/2022: 807 02/13/2022: 976  VSS. She is tachycardic in the 120s but otherwise asymptomatic. O2 sats >98% EKG shows sinus tachycardia, otherwise normal. Chest x-ray is normal. US shows a single intrauterine gestational sac. Labs are unremarkable. Swab is positive for Influenza A. 1g Tylenol PO given here for headache. I reviewed symptom management, including medications and increase PO hydration. Tamiflu offered but declined. May use Tylenol. Instructed to stop taking Aleve. List of safe OTC medications provided. She was instructed to stay at home while symptomatic. Her significant other is present at bedside and was educated on masking in public.   Assessment and Plan  SIUP [redacted] weeks gestation of pregnancy Influenza A  - Discharge home in stable condition - Work note and list of safe meds provided -  Will order OP viability US - Strict return precautions. Return to MAU as needed for new/worsening symptoms   Brand MalesDanielle L Shaleka Brines, CNM 02/13/2022, 6:42 PM

## 2022-02-22 IMAGING — US US PELVIS COMPLETE WITH TRANSVAGINAL
1 series · 14 of 25 positions shown · non-contrast
Comparison: None

CLINICAL DATA: RIGHT adnexal pain; LMP third week [DATE]

EXAM:
TRANSABDOMINAL AND TRANSVAGINAL ULTRASOUND OF PELVIS
TECHNIQUE: Both transabdominal and transvaginal ultrasound examinations of the
pelvis were performed. Transabdominal technique was performed for
global imaging of the pelvis including uterus, ovaries, adnexal
regions, and pelvic cul-de-sac. It was necessary to proceed with
endovaginal exam following the transabdominal exam to visualize the
endometrium and LEFT ovary.

[Series 1: us pelvis complete with transvaginal · 0.23mm/px · 14 of 60 slices shown]
[im 1/60]
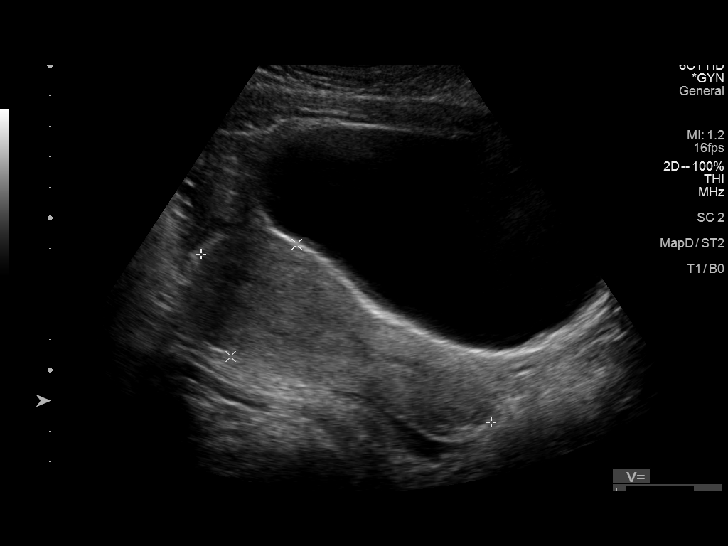
[im 5/60]
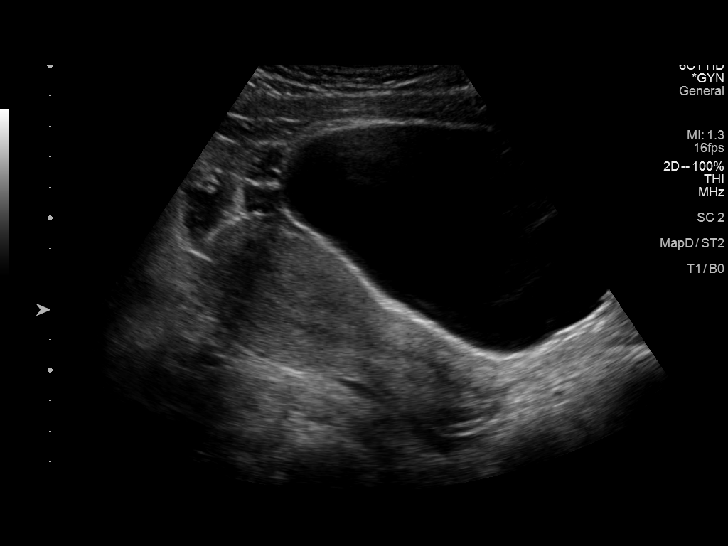
[im 10/60]
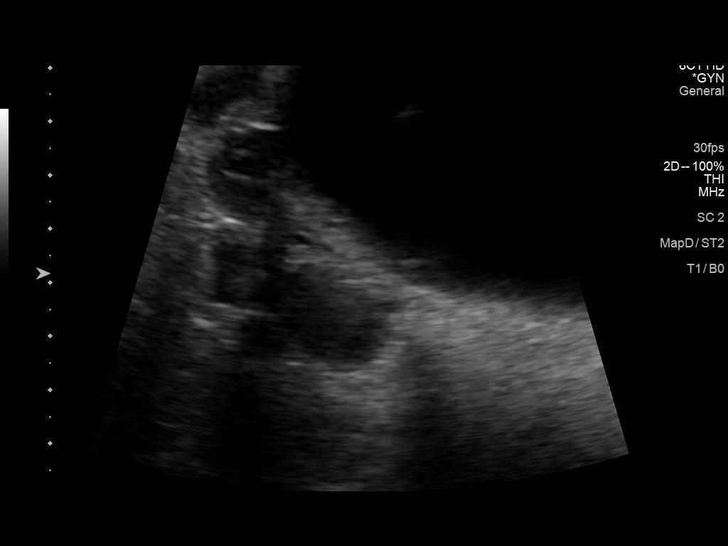
[im 15/60]
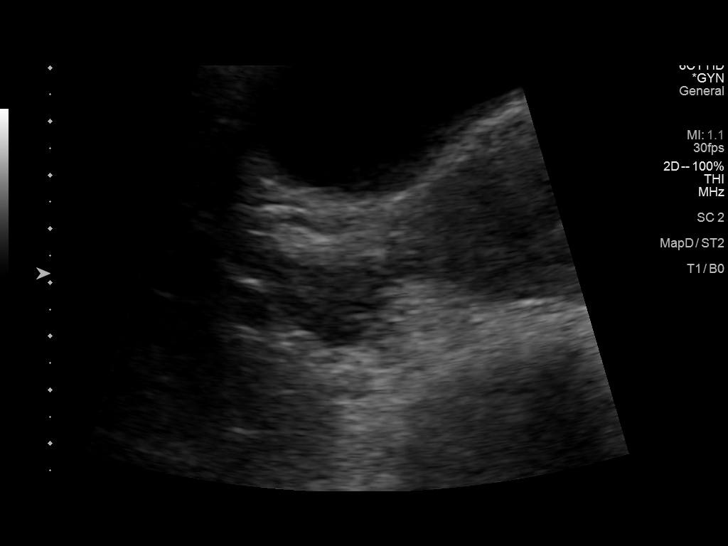
[im 20/60]
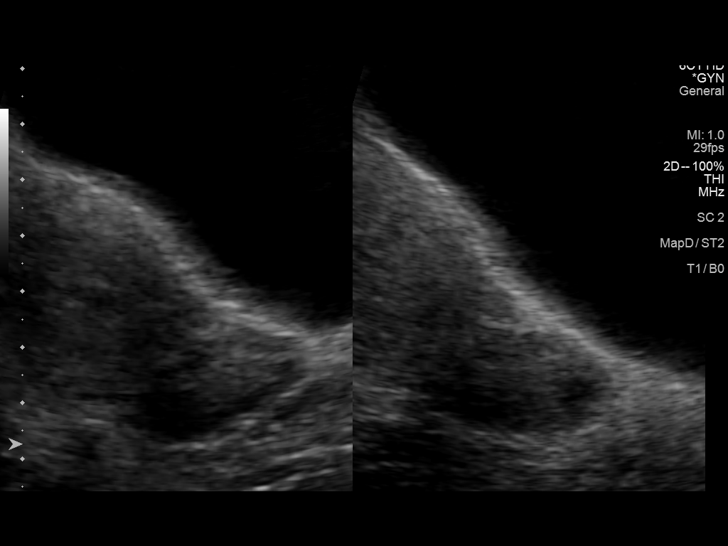
[im 23/60]
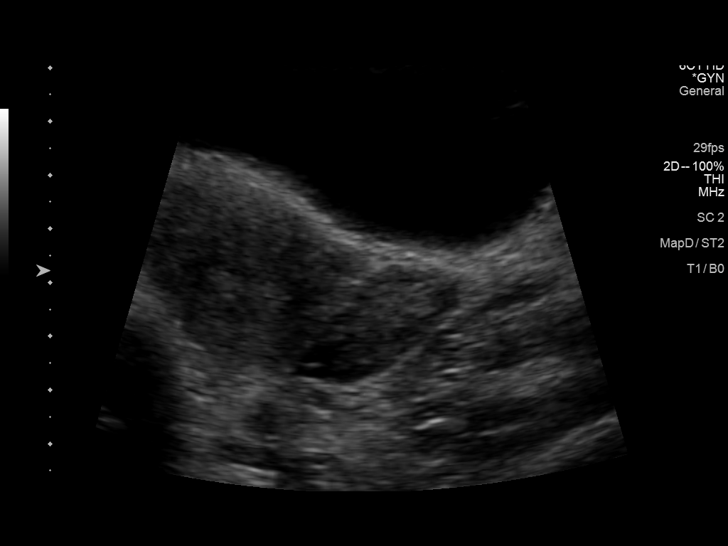
[im 28/60]
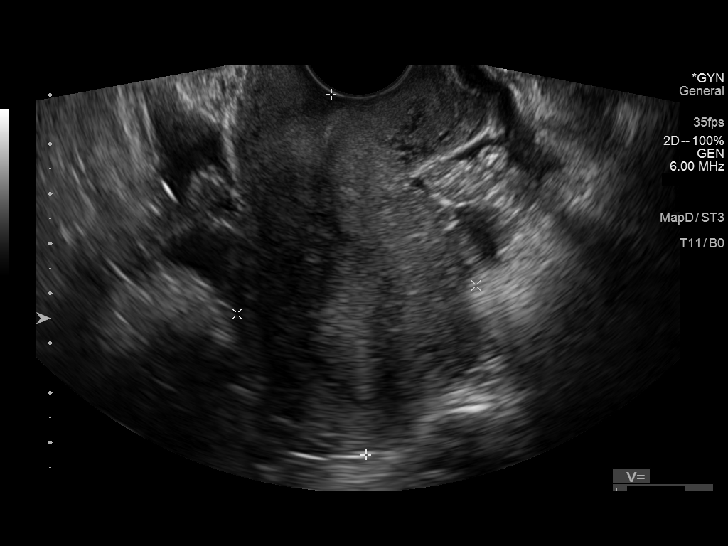
[im 32/60]
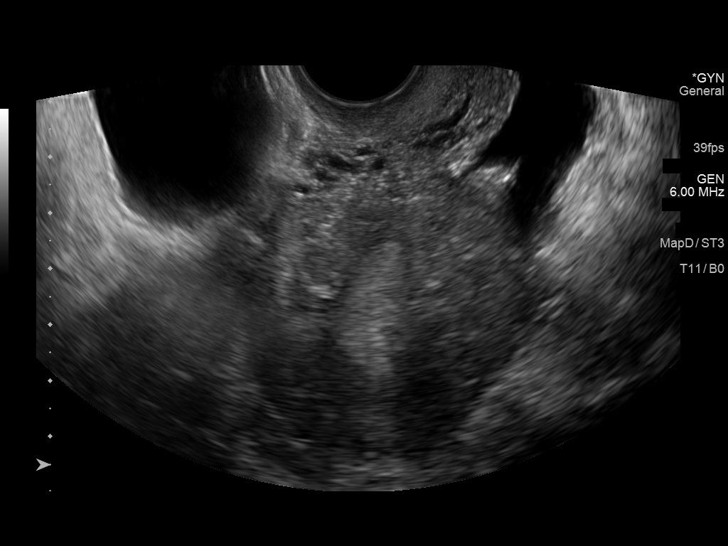
[im 37/60]
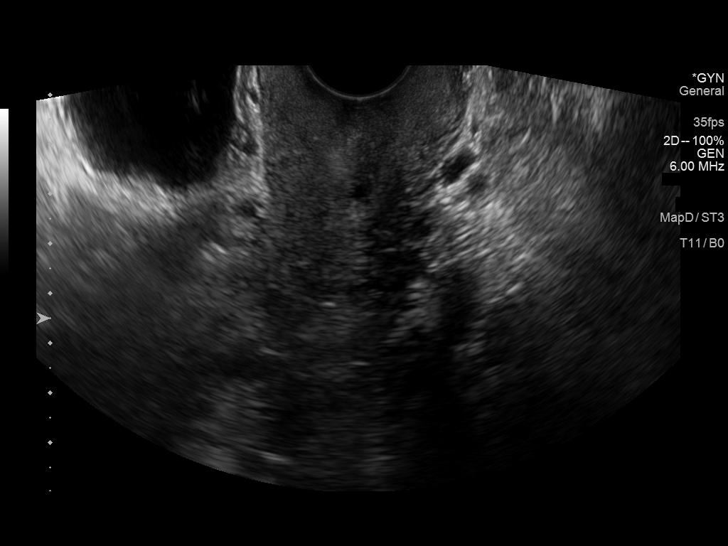
[im 40/60]
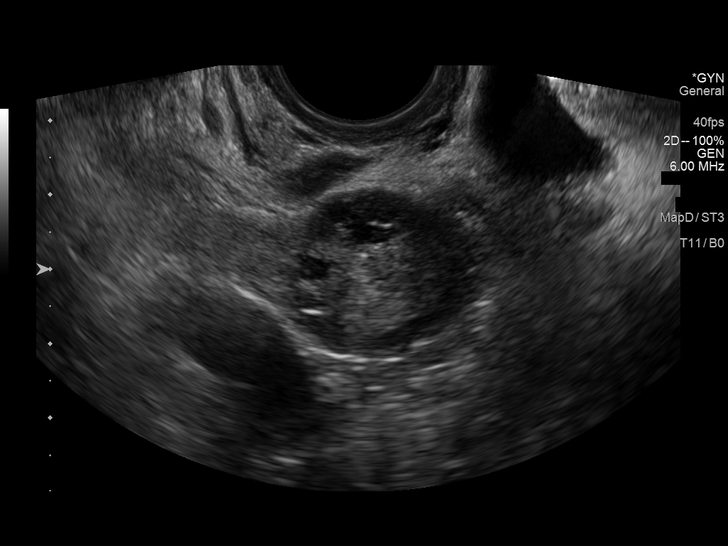
[im 45/60]
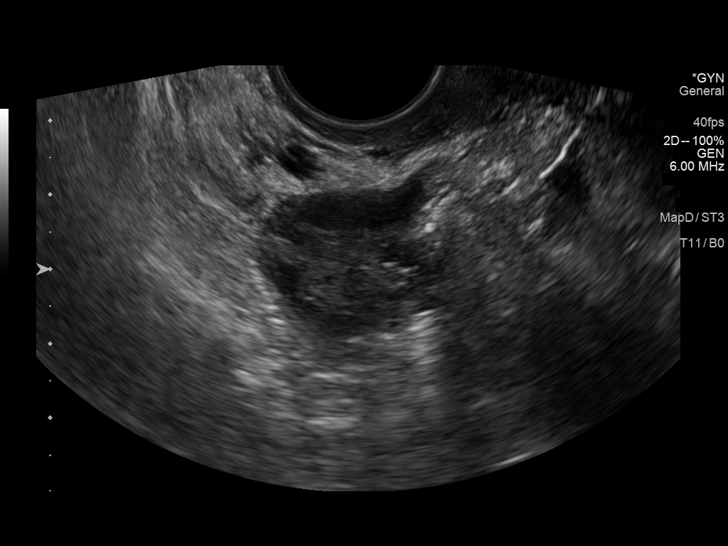
[im 50/60]
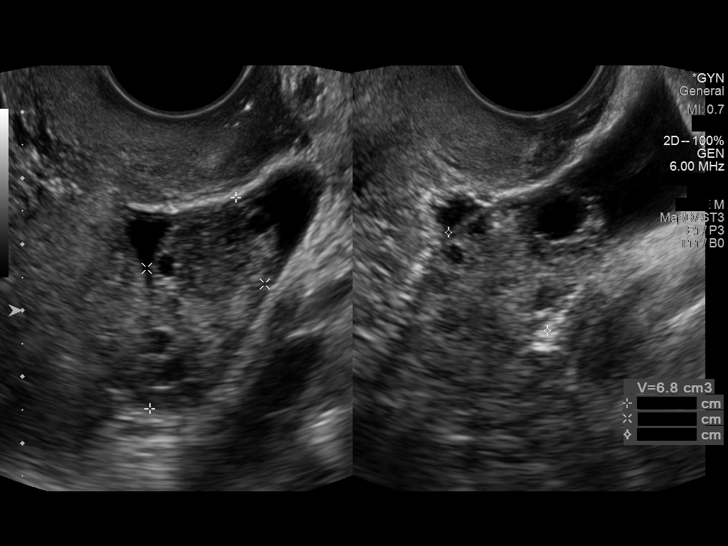
[im 55/60]
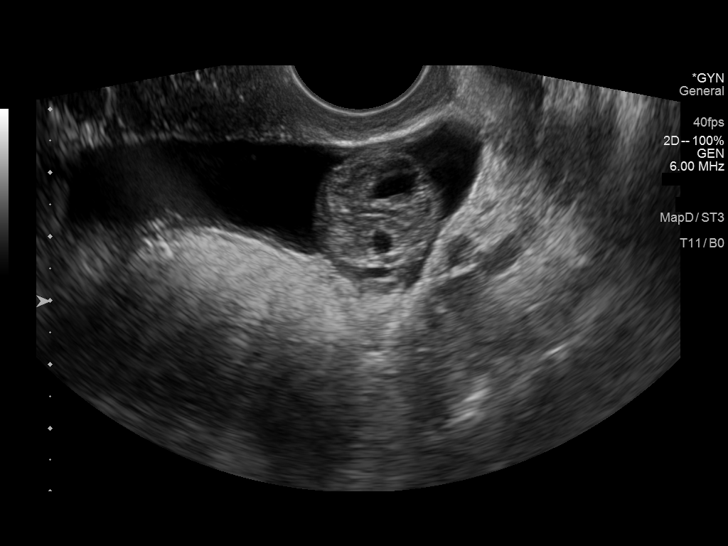
[im 60/60]
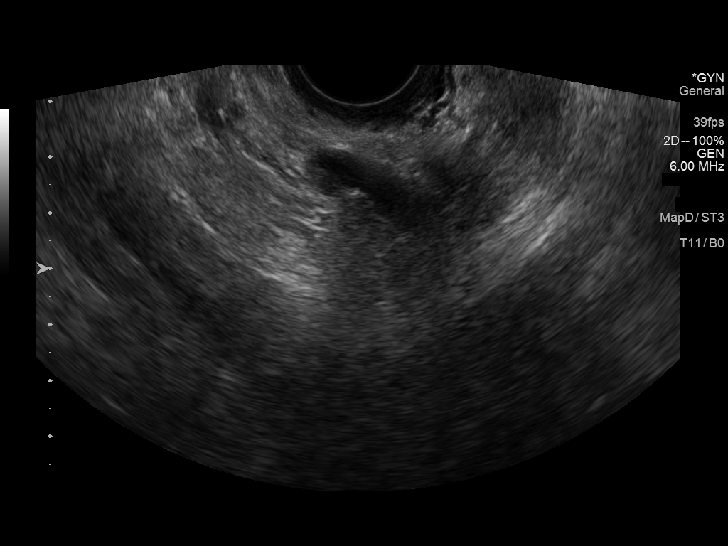

[14 of 25 positions shown; findings below may reference images not displayed]

FINDINGS: Uterus

Measurements: 11.0 x 4.3 x 5.5 cm = volume: 136 mL. Retroverted.
Slightly heterogeneous myometrium without focal mass

Endometrium

Thickness: 11 mm.  No endometrial fluid or focal abnormality

Right ovary

Measurements: 2.2 x 2.4 x 2.3 cm = volume: 6.3 mL. Normal morphology
without mass

Left ovary

Measurements: 3.4 x 1.8 x 2.1 cm = volume: 7.0 mL. Normal morphology
without mass

Other findings

Small amount of free pelvic fluid.  No adnexal masses.
IMPRESSION: Normal exam.

## 2022-03-11 ENCOUNTER — Other Ambulatory Visit: Payer: Federal, State, Local not specified - PPO

## 2022-03-12 ENCOUNTER — Ambulatory Visit (HOSPITAL_COMMUNITY): Payer: Federal, State, Local not specified - PPO

## 2023-04-04 ENCOUNTER — Ambulatory Visit: Payer: Federal, State, Local not specified - PPO | Admitting: Allergy

## 2023-04-04 ENCOUNTER — Encounter: Payer: Self-pay | Admitting: Allergy

## 2023-04-04 ENCOUNTER — Other Ambulatory Visit: Payer: Self-pay

## 2023-04-04 VITALS — BP 108/80 | HR 75 | Temp 98.1°F | Resp 19 | Ht 62.75 in | Wt 213.4 lb

## 2023-04-04 DIAGNOSIS — J4530 Mild persistent asthma, uncomplicated: Secondary | ICD-10-CM | POA: Diagnosis not present

## 2023-04-04 DIAGNOSIS — J302 Other seasonal allergic rhinitis: Secondary | ICD-10-CM

## 2023-04-04 DIAGNOSIS — T7800XD Anaphylactic reaction due to unspecified food, subsequent encounter: Secondary | ICD-10-CM | POA: Diagnosis not present

## 2023-04-04 DIAGNOSIS — T7800XA Anaphylactic reaction due to unspecified food, initial encounter: Secondary | ICD-10-CM

## 2023-04-04 DIAGNOSIS — J3089 Other allergic rhinitis: Secondary | ICD-10-CM | POA: Diagnosis not present

## 2023-04-04 DIAGNOSIS — H1013 Acute atopic conjunctivitis, bilateral: Secondary | ICD-10-CM | POA: Diagnosis not present

## 2023-04-04 MED ORDER — AZELASTINE HCL 0.05 % OP SOLN: 1.0000 [drp] | Freq: Two times a day (BID) | OPHTHALMIC | 2 refills | Status: AC

## 2023-04-04 MED ORDER — BUDESONIDE-FORMOTEROL FUMARATE 160-4.5 MCG/ACT IN AERO: 2.0000 | INHALATION_SPRAY | Freq: Two times a day (BID) | RESPIRATORY_TRACT | 5 refills | Status: AC

## 2023-04-04 MED ORDER — EPINEPHRINE 0.3 MG/0.3ML IJ SOAJ: 0.3000 mg | INTRAMUSCULAR | 2 refills | Status: AC | PRN

## 2023-04-04 MED ORDER — RYALTRIS 665-25 MCG/ACT NA SUSP: NASAL | 5 refills | Status: AC

## 2023-04-04 MED ORDER — ALBUTEROL SULFATE HFA 108 (90 BASE) MCG/ACT IN AERS: 2.0000 | INHALATION_SPRAY | Freq: Four times a day (QID) | RESPIRATORY_TRACT | 0 refills | Status: DC | PRN

## 2023-04-04 NOTE — Patient Instructions (Addendum)
 Asthma Increased frequency of symptoms since December, requiring albuterol  use multiple times per week. Recent episode of bronchitis. Pulmonary function test shows obstructive pattern with FEV1/FVC ratio of 67% which normalized (82%) after albuterol  use in office. Previously used Symbicort  with good effect. -Resume Symbicort  160mcg 2 puffs twice a day with spacer device as maintenance therapy.  Rinse mouth after use.   May use Symbicort  as a rescue inhaler if needed.  Max puffs per day of Symbicort  10-12 puffs. -Have access to albuterol  inhaler 2 puffs every 4-6 hours as needed for cough/wheeze/shortness of breath/chest tightness.  May use 15-20 minutes prior to activity.   Monitor frequency of use.   -Consider adding Singulair if symptoms persist despite Symbicort . -Consider injectable asthma medications if controller medications are not effective enough. Will discuss these options in more details if needed in future  Asthma control goals:  Full participation in all desired activities (may need albuterol  before activity) Albuterol  use two time or less a week on average (not counting use with activity) Cough interfering with sleep two time or less a month Oral steroids no more than once a year No hospitalizations  Allergic Rhinitis with conjunctivitis Symptoms of itchy, watery eyes, runny nose, and sneezing, particularly in spring and summer. Previous allergy testing showed sensitivity to common allergens. Cetirizine ineffective, but Flonase  provides relief. -Try Xyzal 5mg  or Allegra 180mg  daily as needed for allergy symptoms.  These may be more effective than Zyrtec. -Try Ryaltris  nasal spray 2 sprays each nostril twice a day as needed for runny or stuffy nose.  If this is not covered with insurance then you can get OTC options below. -Flonase  2 sprays each nostril daily for 1-2 weeks at a time before stopping once nasal congestion improves for maximum benefit for nasal congestion. -Astepro  2  sprays each nostril twice a day as needed for nasal drainage. -Use Optivar  1 drop each eye twice a day as needed for itchy/watery eyes.  -Will request previous allergy testing records from [facility].  Shellfish Allergy -Continue avoidance of shellfish -Have access to self-injectable epinephrine  (Epipen  or AuviQ) 0.3mg  at all times -Follow emergency action plan in case of allergic reaction  Follow-up in 3-4 months or sooner if needed

## 2023-04-04 NOTE — Progress Notes (Signed)
 New Patient Note  RE: Krista [name] MRN: [id number] DOB: Jul 09, 1985 Date of Office Visit: 04/04/2023  Primary care provider: Anice Wilbert BIRCH, NP  Chief Complaint: asthma  History of present illness: Krista [name] is a 38 y.o. female presenting today for evaluation of asthma.  Discussed the use of AI scribe software for clinical note transcription with the patient, who gave verbal consent to proceed.  Since December, she has experienced worsening respiratory symptoms, including coughing, wheezing, shortness of breath, and chest tightness. The frequency of albuterol  inhaler use has increased from once or twice a month to a couple of times a week, providing only temporary relief. Last week, she had an episode of bronchitis requiring urgent care, where she received a nebulizer treatment and a steroid shot. She was instructed to use the nebulizer three times a day for three days, which she completed. She has not used the nebulizer since then.  In 2021, she was prescribed a Symbicort  inhaler, which was effective in controlling her symptoms at that time, but she is not currently using it. She has previously been prescribed prednisone  and received a steroid shot for her asthma.  She has a history of allergies, including a shellfish allergy that causes tingling and numbness sensation of the lips and mouth, for which she has been prescribed in EpiPen .   She also has seasonal allergies, experiencing itchy, watery eyes, and a runny, stuffy nose, particularly in the summer and spring. She underwent allergy testing with skin pricks around 2020 or 2021 that was positive to a variety of allergens.  She states she did undergo allergy shots but during the course she had anaphylaxis and did not continue. Cetirizine was ineffective, but Flonase  and azelastine  eye drops have been helpful for her symptoms.      Review of systems: 10pt ROS negative unless noted above in HPI  All other systems negative unless  noted above in HPI  Past medical history: Past Medical History:  Diagnosis Date   Anemia    History of   Angio-edema    Anxiety    Asthma    Cyst of right kidney    Depression    Recurrent upper respiratory infection (URI)    Urticaria     Past surgical history: Past Surgical History:  Procedure Laterality Date   DILATION AND EVACUATION N/A 02/21/2016   Procedure: DILATATION AND EVACUATION;  Surgeon: Lang JINNY Peel, DO;  Location: [facility];  Service: Gynecology;  Laterality: N/A;   IUD REMOVAL  11/2016   NO PAST SURGERIES     ROBOTIC ASSITED PARTIAL NEPHRECTOMY Right 02/07/2017   Procedure: XI ROBOTIC ASSITED PARTIAL NEPHRECTOMY;  Surgeon: Alvaro Hummer, MD;  Location: [facility];  Service: Urology;  Laterality: Right;    Family history:  Family History  Problem Relation Age of Onset   Arthritis Mother        OA?, not specifically RA   COPD Mother    Asthma Sister    Arthritis Maternal Grandmother    Allergic rhinitis Son    Eczema Son    Allergic rhinitis Son    Heart disease Neg Hx    Cancer Neg Hx    Stroke Neg Hx     Social history: Lives in an apartment with carpeting with electric heating and central cooling.  Cat and dog in the home.  There is concern for water  damage or mildew in the home.  No concern for roaches in the home.  She is a  [employer].  Denies a smoking history   Medication List: Current Outpatient Medications  Medication Sig Dispense Refill   atenolol (TENORMIN) 50 MG tablet Take 50 mg by mouth daily.     buPROPion (WELLBUTRIN XL) 150 MG 24 hr tablet Take 150 mg by mouth 3 (three) times daily.   0   D3-1000 25 MCG (1000 UT) tablet TAKE 2 TABLETS BY MOUTH TWICE DAILY 180 tablet 0   escitalopram (LEXAPRO) 20 MG tablet Take 1 tablet by mouth daily.     ipratropium-albuterol  (DUONEB) 0.5-2.5 (3) MG/3ML SOLN Inhale 3 mLs into the lungs every 6 (six) hours as needed.     Olopatadine-Mometasone (RYALTRIS ) 665-25 MCG/ACT SUSP 2 sprays each nostril twice a  day as needed for runny or stuffy nose 29 g 5   acetaminophen  (TYLENOL ) 500 MG tablet Take 1,000 mg by mouth every 6 (six) hours as needed. (Patient not taking: Reported on 04/04/2023)     albuterol  (VENTOLIN  HFA) 108 (90 Base) MCG/ACT inhaler Inhale 2 puffs into the lungs every 6 (six) hours as needed for wheezing or shortness of breath. 6.7 g 0   azelastine  (OPTIVAR ) 0.05 % ophthalmic solution Place 1 drop into both eyes 2 (two) times daily. 6 mL 2   budesonide -formoterol  (SYMBICORT ) 160-4.5 MCG/ACT inhaler Inhale 2 puffs into the lungs 2 (two) times daily. 1 each 5   cetirizine (ZYRTEC) 10 MG tablet Take 10 mg by mouth daily as needed for allergies or rhinitis.  (Patient not taking: Reported on 02/05/2022)     EPINEPHrine  0.3 mg/0.3 mL IJ SOAJ injection Inject 0.3 mg into the muscle as needed for anaphylaxis. 1 each 2   mometasone (NASONEX) 50 MCG/ACT nasal spray Place 2 sprays into the nose daily.  (Patient not taking: Reported on 04/04/2023)     Prenatal Multivit-Min-Fe-FA (PRENATAL VITAMINS) 0.8 MG tablet Take 1 tablet by mouth daily. (Patient not taking: Reported on 04/04/2023) 90 tablet 3   Spacer/Aero-Holding Chambers (AEROCHAMBER MV) inhaler as directed inhalation 1 for 1 days     No current facility-administered medications for this visit.    Known medication allergies: Allergies  Allergen Reactions   Shellfish Allergy Itching    Tongue itch and gets numb      Physical examination: Blood pressure 108/80, pulse 75, temperature 98.1 F (36.7 C), temperature source Temporal, resp. rate 19, height 5' 2.75 (1.594 m), weight 213 lb 6.4 oz (96.8 kg), SpO2 97%, unknown if currently breastfeeding.  General: Alert, interactive, in no acute distress. HEENT: PERRLA, TMs pearly gray, turbinates minimally edematous without discharge, post-pharynx non erythematous. Neck: Supple without lymphadenopathy. Lungs: Clear to auscultation without wheezing, rhonchi or rales. {no increased work of  breathing. CV: Normal S1, S2 without murmurs. Abdomen: Nondistended, nontender. Skin: Warm and dry, without lesions or rashes. Extremities:  No clubbing, cyanosis or edema. Neuro:   Grossly intact.  Diagnositics/Labs:  Spirometry: FEV1: 1.67 L 67%, FVC: 2.78 L 92% predicted.  Status post albuterol  she had a 23% increase in FEV1 to 2.06 L or 82% which normalized and is significant  Assessment and plan: Asthma, mild persistent-not well-controlled Increased frequency of symptoms since December, requiring albuterol  use multiple times per week. Recent episode of bronchitis. Pulmonary function test shows obstructive pattern with FEV1/FVC ratio of 67% which normalized (82%) after albuterol  use in office. Previously used Symbicort  with good effect. -Resume Symbicort  160mcg 2 puffs twice a day with spacer device as maintenance therapy.  Rinse mouth after use.   May use Symbicort  as a rescue  inhaler if needed.  Max puffs per day of Symbicort  10-12 puffs. -Have access to albuterol  inhaler 2 puffs every 4-6 hours as needed for cough/wheeze/shortness of breath/chest tightness.  May use 15-20 minutes prior to activity.   Monitor frequency of use.   -Consider adding Singulair if symptoms persist despite Symbicort . -Consider injectable asthma medications if controller medications are not effective enough. Will discuss these options in more details if needed in future  Asthma control goals:  Full participation in all desired activities (may need albuterol  before activity) Albuterol  use two time or less a week on average (not counting use with activity) Cough interfering with sleep two time or less a month Oral steroids no more than once a year No hospitalizations  Allergic Rhinitis with conjunctivitis Symptoms of itchy, watery eyes, runny nose, and sneezing, particularly in spring and summer. Previous allergy testing showed sensitivity to common allergens. Cetirizine ineffective, but Flonase  provides  relief. -Try Xyzal 5mg  or Allegra 180mg  daily as needed for allergy symptoms.  These may be more effective than Zyrtec. -Try Ryaltris  nasal spray 2 sprays each nostril twice a day as needed for runny or stuffy nose.  If this is not covered with insurance then you can get OTC options below. -Flonase  2 sprays each nostril daily for 1-2 weeks at a time before stopping once nasal congestion improves for maximum benefit for nasal congestion. -Astepro  2 sprays each nostril twice a day as needed for nasal drainage. -Use Optivar  1 drop each eye twice a day as needed for itchy/watery eyes.  -Will request previous allergy testing records from [facility].  Food allergy -Continue avoidance of shellfish -Have access to self-injectable epinephrine  (Epipen  or AuviQ) 0.3mg  at all times -Follow emergency action plan in case of allergic reaction  Follow-up in 3-4 months or sooner if needed  I appreciate the opportunity to take part in Krista [name]'s care. Please do not hesitate to contact me with questions.  Sincerely,   Danita Brain, MD Allergy/Immunology [facility]

## 2023-05-01 ENCOUNTER — Other Ambulatory Visit: Payer: Self-pay | Admitting: Allergy

## 2023-05-01 DIAGNOSIS — J4530 Mild persistent asthma, uncomplicated: Secondary | ICD-10-CM

## 2023-06-29 ENCOUNTER — Other Ambulatory Visit: Payer: Self-pay | Admitting: Allergy

## 2023-06-29 DIAGNOSIS — J4530 Mild persistent asthma, uncomplicated: Secondary | ICD-10-CM

## 2023-07-02 ENCOUNTER — Ambulatory Visit: Payer: Federal, State, Local not specified - PPO | Admitting: Allergy

## 2023-07-02 DIAGNOSIS — J309 Allergic rhinitis, unspecified: Secondary | ICD-10-CM

## 2023-07-24 ENCOUNTER — Emergency Department (HOSPITAL_COMMUNITY)
Admission: EM | Admit: 2023-07-24 | Discharge: 2023-07-24 | Disposition: A | Discharge status: Home / Self Care | Attending: Emergency Medicine | Admitting: Emergency Medicine

## 2023-07-24 ENCOUNTER — Emergency Department (HOSPITAL_COMMUNITY)

## 2023-07-24 ENCOUNTER — Encounter (HOSPITAL_COMMUNITY): Payer: Self-pay

## 2023-07-24 ENCOUNTER — Other Ambulatory Visit: Payer: Self-pay

## 2023-07-24 ENCOUNTER — Emergency Department (EMERGENCY_DEPARTMENT_HOSPITAL)
Admit: 2023-07-24 | Discharge: 2023-07-24 | Disposition: A | Discharge status: Home / Self Care | Attending: Emergency Medicine | Admitting: Emergency Medicine

## 2023-07-24 DIAGNOSIS — R600 Localized edema: Secondary | ICD-10-CM | POA: Diagnosis not present

## 2023-07-24 DIAGNOSIS — R42 Dizziness and giddiness: Secondary | ICD-10-CM | POA: Insufficient documentation

## 2023-07-24 DIAGNOSIS — R2240 Localized swelling, mass and lump, unspecified lower limb: Secondary | ICD-10-CM | POA: Diagnosis present

## 2023-07-24 DIAGNOSIS — R609 Edema, unspecified: Secondary | ICD-10-CM

## 2023-07-24 DIAGNOSIS — M79661 Pain in right lower leg: Secondary | ICD-10-CM

## 2023-07-24 LAB — HEPATIC FUNCTION PANEL
ALT: 28 U/L (ref 0–44)
AST: 26 U/L (ref 15–41)
Albumin: 3.3 g/dL — ABNORMAL LOW (ref 3.5–5.0)
Alkaline Phosphatase: 52 U/L (ref 38–126)
Bilirubin, Direct: <0.1 mg/dL (ref 0.0–0.2)
Total Bilirubin: 0.5 mg/dL (ref 0.0–1.2)
Total Protein: 6 g/dL — ABNORMAL LOW (ref 6.5–8.1)

## 2023-07-24 LAB — BASIC METABOLIC PANEL WITH GFR
Anion gap: 9 (ref 5–15)
BUN: 8 mg/dL (ref 6–20)
CO2: 24 mmol/L (ref 22–32)
Calcium: 8.9 mg/dL (ref 8.9–10.3)
Chloride: 106 mmol/L (ref 98–111)
Creatinine, Ser: 0.89 mg/dL (ref 0.44–1.00)
GFR, Estimated: >60 mL/min (ref >60)
Glucose, Bld: 86 mg/dL (ref 70–99)
Potassium: 3.9 mmol/L (ref 3.5–5.1)
Sodium: 139 mmol/L (ref 135–145)

## 2023-07-24 LAB — CBC
HCT: 39 % (ref 36.0–46.0)
Hemoglobin: 12.4 g/dL (ref 12.0–15.0)
MCH: 31 pg (ref 26.0–34.0)
MCHC: 31.8 g/dL (ref 30.0–36.0)
MCV: 97.5 fL (ref 80.0–100.0)
Platelets: 277 10*3/uL (ref 150–400)
RBC: 4 MIL/uL (ref 3.87–5.11)
RDW: 12.9 % (ref 11.5–15.5)
WBC: 8.1 10*3/uL (ref 4.0–10.5)
nRBC: 0 % (ref 0.0–0.2)

## 2023-07-24 LAB — CBG MONITORING, ED
Glucose-Capillary: 67 mg/dL — ABNORMAL LOW (ref 70–99)
Glucose-Capillary: 97 mg/dL (ref 70–99)

## 2023-07-24 LAB — TROPONIN I (HIGH SENSITIVITY): Troponin I (High Sensitivity): <2 ng/L (ref <18)

## 2023-07-24 LAB — BRAIN NATRIURETIC PEPTIDE: B Natriuretic Peptide: 20.8 pg/mL (ref 0.0–100.0)

## 2023-07-24 LAB — HCG, SERUM, QUALITATIVE: Preg, Serum: NEGATIVE

## 2023-07-24 MED ORDER — ACETAMINOPHEN 325 MG PO TABS
650.0000 mg | ORAL_TABLET | Freq: Once | ORAL | Status: AC
  Administered 2023-07-24: 650 mg via ORAL
  Filled 2023-07-24: qty 2

## 2023-07-24 NOTE — ED Provider Notes (Signed)
[place] [facility] AT [facility] Provider Note   CSN: [id number] Arrival date & time: 07/24/23  1137     History  Chief Complaint  Patient presents with   Dizziness   Chest Pain   Shortness of Breath    Krista [name] is a 38 y.o. female.  Patient is a 38 year old female with past medical history of anxiety and depression presenting to the emergency department with chest pain as well as lower extremity swelling.  Patient states that she started to notice some swelling in her feet and ankles last night.  She states that when she woke up this morning she was feeling lightheaded and dizzy.  She states that she had some mild associated chest pain and shortness of breath.  She states that she has also been having some pain in her right calf.  Patient denies any recent nausea, vomiting or diarrhea, fevers or cough.  She denies any recent medication changes.  Denies any history of VTE, any recent hospitalization or surgery, recent long travel car plane, hormone use or cancer history.  The history is provided by the patient.  Dizziness Associated symptoms: chest pain and shortness of breath   Chest Pain Associated symptoms: dizziness and shortness of breath   Shortness of Breath Associated symptoms: chest pain        Home Medications Prior to Admission medications   Medication Sig Start Date End Date Taking? Authorizing Provider  acetaminophen  (TYLENOL ) 500 MG tablet Take 1,000 mg by mouth every 6 (six) hours as needed. Patient not taking: Reported on 04/04/2023    [provider]  albuterol  (VENTOLIN  HFA) 108 (90 Base) MCG/ACT inhaler INHALE 2 PUFFS INTO THE LUNGS EVERY 6 HOURS AS NEEDED FOR WHEEZING OR SHORTNESS OF BREATH 06/30/23   Brian Campanile, MD  atenolol (TENORMIN) 50 MG tablet Take 50 mg by mouth daily. 02/02/22   [provider]  azelastine  (OPTIVAR ) 0.05 % ophthalmic solution Place 1 drop into both eyes 2 (two) times daily.  04/04/23   Brian Campanile, MD  budesonide -formoterol  (SYMBICORT ) 160-4.5 MCG/ACT inhaler Inhale 2 puffs into the lungs 2 (two) times daily. 04/04/23   Brian Campanile, MD  buPROPion (WELLBUTRIN XL) 150 MG 24 hr tablet Take 150 mg by mouth 3 (three) times daily.  07/30/17   [provider]  cetirizine (ZYRTEC) 10 MG tablet Take 10 mg by mouth daily as needed for allergies or rhinitis.  Patient not taking: Reported on 02/05/2022 06/07/19   [provider]  D3-1000 25 MCG (1000 UT) tablet TAKE 2 TABLETS BY MOUTH TWICE DAILY 08/02/20   Tysinger, Christiane Cowing, PA-C  EPINEPHrine  0.3 mg/0.3 mL IJ SOAJ injection Inject 0.3 mg into the muscle as needed for anaphylaxis. 04/04/23   Brian Campanile, MD  escitalopram (LEXAPRO) 20 MG tablet Take 1 tablet by mouth daily. 03/09/20   [provider]  ipratropium-albuterol  (DUONEB) 0.5-2.5 (3) MG/3ML SOLN Inhale 3 mLs into the lungs every 6 (six) hours as needed. 03/20/23 04/19/23  [provider]  mometasone (NASONEX) 50 MCG/ACT nasal spray Place 2 sprays into the nose daily.  Patient not taking: Reported on 04/04/2023 12/29/18   [provider]  Olopatadine-Mometasone (RYALTRIS ) 665-25 MCG/ACT SUSP 2 sprays each nostril twice a day as needed for runny or stuffy nose 04/04/23   Brian Campanile, MD  Prenatal Multivit-Min-Fe-FA (PRENATAL VITAMINS) 0.8 MG tablet Take 1 tablet by mouth daily. Patient not taking: Reported on 04/04/2023 03/03/18   Lynelle Sara,  Camilo Cella, CNM  Spacer/Aero-Holding Chambers (AEROCHAMBER MV) inhaler as directed inhalation 1 for 1 days    [provider]      Allergies    Shellfish allergy    Review of Systems   Review of Systems  Respiratory:  Positive for shortness of breath.   Cardiovascular:  Positive for chest pain.  Neurological:  Positive for dizziness.    Physical Exam Updated Vital Signs BP 135/74 (BP Location: Right Arm)   Pulse 82   Temp 98.6 F (37 C)   Resp  18   Ht 5\' 3"  (1.6 m)   Wt 98.9 kg   LMP 06/26/2023 (Approximate)   SpO2 100%   BMI 38.62 kg/m  Physical Exam Vitals and nursing note reviewed.  Constitutional:      General: She is not in acute distress.    Appearance: She is well-developed.  HENT:     Head: Normocephalic.  Eyes:     Extraocular Movements: Extraocular movements intact.  Cardiovascular:     Rate and Rhythm: Normal rate and regular rhythm.     Heart sounds: Normal heart sounds.  Pulmonary:     Effort: Pulmonary effort is normal.     Breath sounds: Normal breath sounds.  Abdominal:     Palpations: Abdomen is soft.     Tenderness: There is no abdominal tenderness.  Musculoskeletal:        General: Normal range of motion.     Cervical back: Normal range of motion and neck supple.     Right lower leg: Tenderness (point tenderness in R posterior calf) present. No edema.     Left lower leg: No edema.  Skin:    General: Skin is warm and dry.  Neurological:     General: No focal deficit present.     Mental Status: She is alert and oriented to person, place, and time.  Psychiatric:        Mood and Affect: Mood normal.        Behavior: Behavior normal.     ED Results / Procedures / Treatments   Labs (all labs ordered are listed, but only abnormal results are displayed) Labs Reviewed  CBG MONITORING, ED - Abnormal; Notable for the following components:      Result Value   Glucose-Capillary 67 (*)    All other components within normal limits  BASIC METABOLIC PANEL WITH GFR  CBC  HCG, SERUM, QUALITATIVE  BRAIN NATRIURETIC PEPTIDE  HEPATIC FUNCTION PANEL  CBG MONITORING, ED  TROPONIN I (HIGH SENSITIVITY)    EKG EKG Interpretation Date/Time:  Thursday Jul 24 2023 11:43:29 EDT Ventricular Rate:  75 PR Interval:  150 QRS Duration:  78 QT Interval:  384 QTC Calculation: 428 R Axis:   31  Text Interpretation: Normal sinus rhythm Normal ECG No significant change since last tracing Confirmed by Celesta Coke ([id number]) on 07/24/2023 2:12:30 PM  Radiology VAS US  LOWER EXTREMITY VENOUS (DVT) (7a-7p) Result Date: 07/24/2023  Lower Venous DVT Study Patient Name:  Krista [name]  Date of Exam:   07/24/2023 Medical Rec #: [id number]      Accession #:    [id number] Date of Birth: April 24, 1985     Patient Gender: F Patient Age:   15 years Exam Location:  [facility] Procedure:      VAS US  LOWER EXTREMITY VENOUS (DVT) Referring Phys: Celesta Coke --------------------------------------------------------------------------------  Indications: Pain.  Risk Factors: None identified. Comparison Study: No prior studies. Performing Technologist: Lerry Ransom RVT  Examination Guidelines: A complete evaluation includes B-mode imaging, spectral Doppler, color Doppler, and power Doppler as needed of all accessible portions of each vessel. Bilateral testing is considered an integral part of a complete examination. Limited examinations for reoccurring indications may be performed as noted. The reflux portion of the exam is performed with the patient in reverse Trendelenburg.  +---------+---------------+---------+-----------+----------+--------------+ RIGHT    CompressibilityPhasicitySpontaneityPropertiesThrombus Aging +---------+---------------+---------+-----------+----------+--------------+ CFV      Full           Yes      Yes                                 +---------+---------------+---------+-----------+----------+--------------+ SFJ      Full                                                        +---------+---------------+---------+-----------+----------+--------------+ FV Prox  Full                                                        +---------+---------------+---------+-----------+----------+--------------+ FV Mid   Full                                                        +---------+---------------+---------+-----------+----------+--------------+ FV DistalFull                                                         +---------+---------------+---------+-----------+----------+--------------+ PFV      Full                                                        +---------+---------------+---------+-----------+----------+--------------+ POP      Full           Yes      Yes                                 +---------+---------------+---------+-----------+----------+--------------+ PTV      Full                                                        +---------+---------------+---------+-----------+----------+--------------+ PERO     Full                                                        +---------+---------------+---------+-----------+----------+--------------+   +----+---------------+---------+-----------+----------+--------------+  LEFTCompressibilityPhasicitySpontaneityPropertiesThrombus Aging +----+---------------+---------+-----------+----------+--------------+ CFV Full           Yes      Yes                                 +----+---------------+---------+-----------+----------+--------------+     Summary: RIGHT: - There is no evidence of deep vein thrombosis in the lower extremity.  - No cystic structure found in the popliteal fossa.  LEFT: - No evidence of common femoral vein obstruction.   *See table(s) above for measurements and observations.    Preliminary    DG Chest 2 View Result Date: 07/24/2023 CLINICAL DATA:  Chest pain EXAM: CHEST - 2 VIEW COMPARISON:  02/13/2022 FINDINGS: The heart size and mediastinal contours are within normal limits. Both lungs are clear. The visualized skeletal structures are unremarkable. IMPRESSION: Normal study. Electronically Signed   By: Janeece Mechanic M.D.   On: 07/24/2023 13:26    Procedures Procedures    Medications Ordered in ED Medications  acetaminophen  (TYLENOL ) tablet 650 mg (650 mg Oral Given 07/24/23 1547)    ED Course/ Medical Decision Making/ A&P Clinical Course as of 07/24/23  1553  Thu Jul 24, 2023  1455 DVT US  negative. [VK]  1545 DG Chest 2 View [HN]  1552 Patient signed out to Dr. Drury Geralds pending remainder of labs, plan for discharge home if normal. [VK]    Clinical Course User Index [HN] Merdis Stalling, MD [VK] Kingsley, Adelle Zachar K, DO                                 Medical Decision Making This patient presents to the ED with chief complaint(s) of LE swelling, CP, dizziness with pertinent past medical history of anxiety, depression which further complicates the presenting complaint. The complaint involves an extensive differential diagnosis and also carries with it a high risk of complications and morbidity.    The differential diagnosis includes ACS, arrhythmia, anemia, pneumonia, pneumothorax, pulmonary edema, pleural effusion, new onset CHF, hepatic dysfunction, renal dysfunction, considering DVT with calf pain, she is otherwise PERC negative making PE unlikely, considering dependent edema  Additional history obtained: Additional history obtained from N/A Records reviewed N/A  ED Course and Reassessment: On patient's arrival she is hemodynamically stable in no acute distress.  Was initially evaluated in triage and had EKG, labs including troponin and chest x-ray performed.  EKG showed normal sinus rhythm without acute ischemic changes, initial troponin was negative.  Symptoms ongoing since 7 AM so single troponin is sufficient.  Patient will have LFTs and BNP added on as well as a DVT ultrasound with her associated calf pain which will be closely reassessed.  Independent labs interpretation:  The following labs were independently interpreted: within normal range thus far  Independent visualization of imaging: - I independently visualized the following imaging with scope of interpretation limited to determining acute life threatening conditions related to emergency care: CXR, DVT US , which revealed no acute disease  Amount and/or Complexity of Data  Reviewed Labs: ordered. Radiology: ordered. Decision-making details documented in ED Course.  Risk OTC drugs.          Final Clinical Impression(s) / ED Diagnoses Final diagnoses:  Dizziness  Dependent edema    Rx / DC Orders ED Discharge Orders     None         Kingsley, Serenitee Fuertes K, DO  07/24/23 1553  

## 2023-07-24 NOTE — Discharge Instructions (Addendum)
 Thank you for coming to [facility] Emergency Department. You were seen for chest pain and lower extremity swelling. We did an exam, labs, and imaging, and these showed no acute findings.  You elected to leave AGAINST MEDICAL ADVICE prior to labs being complete.  We recommended that you stay for further lab workup but you stated you would like to leave.  You accept the risks of leave including worsening of condition, up to and including death.  You can wear compression stockings for your leg swelling and please follow-up with your primary care physician.  You can take Tylenol  Motrin  for pain as well.  Please follow up with your primary care provider within 1 week.   Do not hesitate to return to the ED or call 911 if you experience: -Worsening symptoms -Lightheadedness, passing out -Fevers/chills -Anything else that concerns you

## 2023-07-24 NOTE — ED Triage Notes (Addendum)
 Pt reports feeling "light headed" that started this morning at 7 am, denies vision changes  . Pt also reports bilat LE swelling that started yesterday. Denies Cardiac History, reports CP and SHOB currently. NAD in triage. No other neuro deficit noted.

## 2023-07-24 NOTE — Progress Notes (Signed)
 Right lower extremity venous duplex has been completed. Preliminary results can be found in CV Proc through chart review.  Results were given to Dr. Nora Beal.   07/24/23 3:15 PM Birda Buffy RVT

## 2023-07-24 NOTE — ED Provider Notes (Signed)
 3:44 PM Assumed care of patient from off-going team. For more details, please see note from same day.  In brief, this is a 38 y.o. female h/o anxiety/depression, asthma. Presented w/ chest pain, swelling in feet/ankles. Mild SOB, dizzy/lightheaded. EKG wnl, BMP/CBC/glucose/trop unremarkable. DVT US  neg, PERC neg. CXR normal. No resp distress.   Plan/Dispo at time of sign-out & ED Course since sign-out: [ ]  BNP, LFTs  BP 135/74 (BP Location: Right Arm)   Pulse 82   Temp 98.6 F (37 C)   Resp 18   Ht 5\' 3"  (1.6 m)   Wt 98.9 kg   LMP 06/26/2023 (Approximate)   SpO2 100%   BMI 38.62 kg/m    ED Course:   Clinical Course as of 07/24/23 1720  Thu Jul 24, 2023  1455 DVT US  negative. [VK]  1545 DG Chest 2 View [HN]  1552 Patient signed out to Dr. Drury Geralds pending remainder of labs, plan for discharge home if normal. [VK]  1717 Patient states it is her baby's birthday and she would like to leave AMA. Discussed with the patient about risks of leaving including worsening of condition, failure to recognize abnormal values, risks up to and including death. Patient reports understanding. She is very well-appearing and HDS. Afebrile. Instructed to f/u with PCP whitn 1 week for further reevaluation and lab check. Patient reports understanding. [HN]    Clinical Course User Index [HN] Merdis Stalling, MD [VK] Kingsley, Victoria K, DO    Dispo: AMA ------------------------------- Annita Kindle, MD Emergency Medicine  This note was created using dictation software, which may contain spelling or grammatical errors.   Merdis Stalling, MD 07/24/23 [phone]

## 2023-07-24 NOTE — ED Notes (Signed)
 Pt not in room.

## 2023-07-24 NOTE — ED Provider Triage Note (Cosign Needed)
 Emergency Medicine Provider Triage Evaluation Note  Krista [name] , a 38 y.o. female  was evaluated in triage.  Pt complains of bilateral lower extremity swelling and soreness that started last night.  Chest pain and shortness of breath that started earlier today.  No cardiac history.  History of asthma.  No URI symptoms.  Review of Systems  Positive:  Negative:   Physical Exam  BP 135/74 (BP Location: Right Arm)   Pulse 82   Temp 98.6 F (37 C)   Resp 18   Ht 5\' 3"  (1.6 m)   Wt 98.9 kg   LMP 06/26/2023 (Approximate)   SpO2 100%   BMI 38.62 kg/m  Gen:   Awake, no distress   Resp:  Normal effort  MSK:   Moves extremities without difficulty, trace bilateral lower extremity edema Other:    Medical Decision Making  Medically screening exam initiated at 12:31 PM.  Appropriate orders placed.  Krista [name] was informed that the remainder of the evaluation will be completed by another provider, this initial triage assessment does not replace that evaluation, and the importance of remaining in the ED until their evaluation is complete.  Also with basic labs, EKG and chest x-ray.  EKG normal sinus rhythm.  Vitals are stable and she is suitable for waiting room.   Krista Horning, PA-C 07/24/23 1233

## 2024-03-12 ENCOUNTER — Ambulatory Visit (HOSPITAL_COMMUNITY)
